# Patient Record
Sex: Male | Born: 1975
Health system: Southern US, Community
[De-identification: ages and names within clinical notes are randomized; demographics above are authoritative.]

## PROBLEM LIST (undated history)

## (undated) DIAGNOSIS — B20 Human immunodeficiency virus [HIV] disease: Secondary | ICD-10-CM

## (undated) DIAGNOSIS — K921 Melena: Secondary | ICD-10-CM

## (undated) DIAGNOSIS — B59 Pneumocystosis: Secondary | ICD-10-CM

## (undated) DIAGNOSIS — R569 Unspecified convulsions: Secondary | ICD-10-CM

## (undated) DIAGNOSIS — J189 Pneumonia, unspecified organism: Secondary | ICD-10-CM

## (undated) DIAGNOSIS — N189 Chronic kidney disease, unspecified: Secondary | ICD-10-CM

## (undated) HISTORY — DX: Human immunodeficiency virus (HIV) disease: B20

## (undated) HISTORY — DX: Unspecified convulsions: R56.9

## (undated) HISTORY — DX: Melena: K92.1

---
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
## 2003-12-27 ENCOUNTER — Emergency Department (HOSPITAL_COMMUNITY): Admission: EM | Admit: 2003-12-27 | Discharge: 2003-12-27 | Payer: Self-pay | Admitting: Emergency Medicine

## 2003-12-28 ENCOUNTER — Emergency Department (HOSPITAL_COMMUNITY): Admission: EM | Admit: 2003-12-28 | Discharge: 2003-12-28 | Payer: Self-pay | Admitting: Emergency Medicine

## 2004-01-04 ENCOUNTER — Emergency Department (HOSPITAL_COMMUNITY): Admission: EM | Admit: 2004-01-04 | Discharge: 2004-01-04 | Payer: Self-pay | Admitting: Emergency Medicine

## 2007-03-16 ENCOUNTER — Emergency Department (HOSPITAL_COMMUNITY): Admission: EM | Admit: 2007-03-16 | Discharge: 2007-03-16 | Payer: Self-pay | Admitting: Emergency Medicine

## 2008-06-15 ENCOUNTER — Emergency Department (HOSPITAL_COMMUNITY): Admission: EM | Admit: 2008-06-15 | Discharge: 2008-06-15 | Payer: Self-pay | Admitting: Emergency Medicine

## 2010-01-09 ENCOUNTER — Emergency Department (HOSPITAL_COMMUNITY)
Admission: EM | Admit: 2010-01-09 | Discharge: 2010-01-09 | Payer: Self-pay | Source: Home / Self Care | Admitting: Emergency Medicine

## 2010-02-02 ENCOUNTER — Inpatient Hospital Stay (HOSPITAL_COMMUNITY)
Admission: EM | Admit: 2010-02-02 | Discharge: 2010-02-05 | Payer: Self-pay | Source: Home / Self Care | Attending: Orthopedic Surgery | Admitting: Orthopedic Surgery

## 2010-02-04 LAB — CBC
HCT: 41.2 % (ref 39.0–52.0)
Hemoglobin: 14 g/dL (ref 13.0–17.0)
MCH: 31.5 pg (ref 26.0–34.0)
MCHC: 34 g/dL (ref 30.0–36.0)
MCV: 92.8 fL (ref 78.0–100.0)
Platelets: 170 10*3/uL (ref 150–400)
RBC: 4.44 MIL/uL (ref 4.22–5.81)
RDW: 12.9 % (ref 11.5–15.5)
WBC: 7 10*3/uL (ref 4.0–10.5)

## 2010-02-04 LAB — GRAM STAIN

## 2010-02-04 LAB — DIFFERENTIAL
Basophils Absolute: 0 10*3/uL (ref 0.0–0.1)
Basophils Relative: 0 % (ref 0–1)
Eosinophils Absolute: 0 10*3/uL (ref 0.0–0.7)
Eosinophils Relative: 0 % (ref 0–5)
Lymphocytes Relative: 28 % (ref 12–46)
Lymphs Abs: 1.9 10*3/uL (ref 0.7–4.0)
Monocytes Absolute: 0.5 10*3/uL (ref 0.1–1.0)
Monocytes Relative: 7 % (ref 3–12)
Neutro Abs: 4.5 10*3/uL (ref 1.7–7.7)
Neutrophils Relative %: 64 % (ref 43–77)

## 2010-02-06 LAB — BASIC METABOLIC PANEL
BUN: 4 mg/dL — ABNORMAL LOW (ref 6–23)
CO2: 28 mEq/L (ref 19–32)
Calcium: 8.4 mg/dL (ref 8.4–10.5)
Chloride: 95 mEq/L — ABNORMAL LOW (ref 96–112)
Creatinine, Ser: 0.85 mg/dL (ref 0.4–1.5)
GFR calc Af Amer: >60 mL/min (ref >60)
GFR calc non Af Amer: >60 mL/min (ref >60)
Glucose, Bld: 101 mg/dL — ABNORMAL HIGH (ref 70–99)
Potassium: 3.8 mEq/L (ref 3.5–5.1)
Sodium: 129 mEq/L — ABNORMAL LOW (ref 135–145)

## 2010-02-06 LAB — WOUND CULTURE

## 2010-02-06 LAB — CULTURE, ROUTINE-ABSCESS

## 2010-02-06 LAB — VANCOMYCIN, TROUGH: Vancomycin Tr: 6.6 ug/mL — ABNORMAL LOW (ref 10.0–20.0)

## 2010-02-08 NOTE — Discharge Summary (Signed)
  NAMELAYTEN, AIKEN NO.:  [id number]  MEDICAL RECORD NO.:  [id number]          PATIENT TYPE:  INP  LOCATION:  5020                         FACILITY:  [facility]  PHYSICIAN:  Madelynn Done, MD  DATE OF BIRTH:  August 21, 1975  DATE OF ADMISSION:  02/02/2010 DATE OF DISCHARGE:  02/05/2010                              DISCHARGE SUMMARY   ADMISSION DIAGNOSIS:  Right forearm abscess.  DISCHARGE DIAGNOSES: 1. Right forearm abscess. 2. Methicillin-resistant staph aureus infection.  PROCEDURES:  Right forearm incision and drainage on February 02, 2010.  DISCHARGE MEDICATIONS: 1. Percocet 5/325 one to two tablets every 4-6 hours as needed for     pain. 2. Doxycycline 100 mg p.o. b.i.d.  REASON FOR ADMISSION:  Mr. Azevedo is a 35 year old right-hand-dominant gentleman, who had worsening right arm pain and swelling.  The patient presented to the emergency department and noted to have worsening abscess of right arm and forearm.  The patient was seen and evaluated in the emergency department and recommended that he undergo the above procedure due to some degree of infection.  HOSPITAL COURSE:  The patient was admitted to the orthopedic floor after he underwent the above procedure.  The patient tolerated this well under general anesthesia.  After the procedure, the patient was began on IV vancomycin for suspected MRSA.  The patient's cultures did grow out MRSA.  The patient's wound was changed on postoperative day #2 and looked much better.  There were no gross purulence.  New dressings applied.  The patient was seen on postoperative day #3 and felt ready to be discharged to home.  On the day of discharge, the patient was afebrile with vital signs stable and normal, tolerating a regular diet, and felt ready to discharge home.  DISPOSITION:  The patient was discharged to home and seen back in the office in approximately 3 days for wound check.  Continue with a long- arm  splint.  No use of the right arm.  Work note was given with restrictions.  He is to continue with the above discharge medications.  Prior to the discharge, the patient's discharge instructions were explained to him, questions were answered, and the patient voiced understanding of the discharge plan.  CONDITION ON DISCHARGE:  Good.     Madelynn Done, MD     FWO/MEDQ  D:  02/05/2010  T:  02/06/2010  Job:  [id number]  Electronically Signed by Bradly Bienenstock IV MD on 02/08/2010 11:56:38 AM

## 2010-02-08 NOTE — Discharge Summary (Signed)
    Electronically Signed by Bradly Bienenstock IV MD on 02/08/2010 11:56:33 AM

## 2010-02-11 LAB — ANAEROBIC CULTURE

## 2010-03-11 ENCOUNTER — Observation Stay (HOSPITAL_COMMUNITY)
Admission: EM | Admit: 2010-03-11 | Discharge: 2010-03-11 | Disposition: A | Discharge status: Home / Self Care | Payer: Self-pay | Attending: Emergency Medicine | Admitting: Emergency Medicine

## 2010-03-11 DIAGNOSIS — L03211 Cellulitis of face: Principal | ICD-10-CM | POA: Insufficient documentation

## 2010-03-11 DIAGNOSIS — L0201 Cutaneous abscess of face: Principal | ICD-10-CM | POA: Insufficient documentation

## 2010-03-13 ENCOUNTER — Emergency Department (HOSPITAL_COMMUNITY)
Admission: EM | Admit: 2010-03-13 | Discharge: 2010-03-13 | Disposition: A | Discharge status: Home / Self Care | Payer: Self-pay | Attending: Emergency Medicine | Admitting: Emergency Medicine

## 2010-03-13 DIAGNOSIS — L03211 Cellulitis of face: Secondary | ICD-10-CM | POA: Insufficient documentation

## 2010-03-13 DIAGNOSIS — L0201 Cutaneous abscess of face: Secondary | ICD-10-CM | POA: Insufficient documentation

## 2010-03-13 DIAGNOSIS — Z09 Encounter for follow-up examination after completed treatment for conditions other than malignant neoplasm: Secondary | ICD-10-CM | POA: Insufficient documentation

## 2010-10-11 LAB — CBC
HCT: 47.3
Hemoglobin: 16.5
MCHC: 34.8
MCV: 97.4
Platelets: 246
RBC: 4.86
RDW: 13.2
WBC: 7.2

## 2010-10-11 LAB — BASIC METABOLIC PANEL
BUN: 13
CO2: 27
Calcium: 8.6
Chloride: 103
Creatinine, Ser: 1.13
GFR calc Af Amer: >60
GFR calc non Af Amer: >60
Glucose, Bld: 95
Potassium: 4.4
Sodium: 134 — ABNORMAL LOW

## 2010-10-11 LAB — DIFFERENTIAL
Basophils Absolute: 0
Basophils Relative: 0
Eosinophils Absolute: 0.1
Eosinophils Relative: 1
Lymphocytes Relative: 32
Lymphs Abs: 2.3
Monocytes Absolute: 0.3
Monocytes Relative: 4
Neutro Abs: 4.5
Neutrophils Relative %: 63

## 2011-02-24 ENCOUNTER — Other Ambulatory Visit: Payer: Self-pay

## 2011-02-24 ENCOUNTER — Emergency Department (HOSPITAL_COMMUNITY)
Admission: EM | Admit: 2011-02-24 | Discharge: 2011-02-24 | Disposition: A | Discharge status: Home / Self Care | Payer: Self-pay | Attending: Emergency Medicine | Admitting: Emergency Medicine

## 2011-02-24 ENCOUNTER — Emergency Department (HOSPITAL_COMMUNITY): Payer: Self-pay

## 2011-02-24 ENCOUNTER — Encounter (HOSPITAL_COMMUNITY): Payer: Self-pay | Admitting: Emergency Medicine

## 2011-02-24 DIAGNOSIS — F17200 Nicotine dependence, unspecified, uncomplicated: Secondary | ICD-10-CM | POA: Insufficient documentation

## 2011-02-24 DIAGNOSIS — R079 Chest pain, unspecified: Secondary | ICD-10-CM | POA: Insufficient documentation

## 2011-02-24 DIAGNOSIS — R05 Cough: Secondary | ICD-10-CM | POA: Insufficient documentation

## 2011-02-24 DIAGNOSIS — K029 Dental caries, unspecified: Secondary | ICD-10-CM | POA: Insufficient documentation

## 2011-02-24 DIAGNOSIS — R059 Cough, unspecified: Secondary | ICD-10-CM | POA: Insufficient documentation

## 2011-02-24 DIAGNOSIS — K089 Disorder of teeth and supporting structures, unspecified: Secondary | ICD-10-CM | POA: Insufficient documentation

## 2011-02-24 DIAGNOSIS — R0602 Shortness of breath: Secondary | ICD-10-CM | POA: Insufficient documentation

## 2011-02-24 DIAGNOSIS — K0889 Other specified disorders of teeth and supporting structures: Secondary | ICD-10-CM

## 2011-02-24 MED ORDER — HYDROCODONE-ACETAMINOPHEN 5-325 MG PO TABS: 1.0000 | ORAL_TABLET | ORAL | Status: AC | PRN

## 2011-02-24 MED ORDER — ALBUTEROL SULFATE HFA 108 (90 BASE) MCG/ACT IN AERS
2.0000 | INHALATION_SPRAY | Freq: Four times a day (QID) | RESPIRATORY_TRACT | Status: DC
  Administered 2011-02-24: 2 via RESPIRATORY_TRACT
  Filled 2011-02-24: qty 6.7

## 2011-02-24 MED ORDER — PENICILLIN V POTASSIUM 500 MG PO TABS: 500.0000 mg | ORAL_TABLET | Freq: Three times a day (TID) | ORAL | Status: DC

## 2011-02-24 MED ORDER — PENICILLIN V POTASSIUM 500 MG PO TABS: 500.0000 mg | ORAL_TABLET | Freq: Three times a day (TID) | ORAL | Status: AC

## 2011-02-24 NOTE — ED Provider Notes (Signed)
History     CSN: [id number]  Arrival date & time 02/24/11  4098   First MD Initiated Contact with Patient 02/24/11 2032      Chief Complaint  Patient presents with  . Cough    (Consider location/radiation/quality/duration/timing/severity/associated sxs/prior treatment) HPI History provided by pt.   Pt has had a cough productive of white phlegm for the past month.  Denies hemoptysis.  Last night he developed SOB while he was lying in bed but it improved when he sat up.  Has mild pain in his chest when he coughs.  Has had subjective fever as well.  Pt smokes cigarettes but this cough is worse than his baseline.  Known sick contacts.  No h/o cardiopulmonary disease.  No RF for PE.  Also c/o right lower toothache; onset around same time as cough.  Has taken aspirin and ibuprofen w/ some relief.   History reviewed. No pertinent past medical history.  Past Surgical History  Procedure Date  . Appendectomy     No family history on file.  History  Substance Use Topics  . Smoking status: Current Everyday Smoker  . Smokeless tobacco: Not on file  . Alcohol Use: Yes      Review of Systems  All other systems reviewed and are negative.    Allergies  Review of patient's allergies indicates no known allergies.  Home Medications  No current outpatient prescriptions on file.  BP 111/78  Pulse 96  Temp(Src) 97.8 F (36.6 C) (Oral)  Resp 15  SpO2 98%  Physical Exam  Nursing note and vitals reviewed. Constitutional: He is oriented to person, place, and time. He appears well-developed and well-nourished. No distress.       Pt is wearing multiple layers including a large winter jacket.   HENT:  Head: Normocephalic and atraumatic.       Right third lower molar w/ decay and mild ttp.  Adjacent gingiva appears normal.   Eyes:       Normal appearance  Neck: Normal range of motion.       Submandibular lymphadenopathy  Cardiovascular: Normal rate.        Elevated HR at approx  96bpm  Pulmonary/Chest: Effort normal and breath sounds normal.  Musculoskeletal:       No peripheral edema or calf tenderness  Neurological: He is alert and oriented to person, place, and time.  Skin: Skin is warm. No rash noted. He is diaphoretic.  Psychiatric: He has a normal mood and affect. His behavior is normal.    ED Course  Procedures (including critical care time)   Date: 02/24/2011  Rate: 88  Rhythm: normal sinus rhythm  QRS Axis: normal  Intervals: normal  ST/T Wave abnormalities: normal  Conduction Disutrbances:none  Narrative Interpretation:   Old EKG Reviewed: none available   Labs Reviewed - No data to display Dg Chest 2 View  02/24/2011  *RADIOLOGY REPORT*  Clinical Data: Cough, shortness of breath  CHEST - 2 VIEW  Comparison: None.  Findings: Lungs clear.  Heart size and pulmonary vascularity normal.  No effusion.  Visualized bones unremarkable.  IMPRESSION: No acute disease  Original Report Authenticated By: Thora Lance III, M.D.     1. Cough   2. Toothache       MDM  36yo smoker, otherwise healthy, presents w/ 1 month productive cough + new onset SOB while laying in bed last night.  Also c/o toothache.  On exam, afebrile, mildly diaphoretic (wearing multiple layers), no respiratory distress,  lungs CTA, occasional coughing, Right 3rd lower molar decayed and ttp.  CXR neg and screening EKG unremarkable.  Pt received an albuterol inhaler for cough and d/c'd home w/ abx (possible periapical abscess) and vicodin as well as referrals to [facility] and several low cost dental clinics.  Return precautions discussed.         Otilio Miu, [place] 02/24/11 2231

## 2011-02-24 NOTE — ED Notes (Signed)
PT. REPORTS PRODUCTIVE COUGH FOR 1 MONTH AND RIGHT LOWER MOLAR PAIN UNRELIEVED BY OTC PAIN MEDICATIONS .

## 2011-02-25 NOTE — ED Provider Notes (Signed)
Medical screening examination/treatment/procedure(s) were conducted as a shared visit with non-physician practitioner(s) and myself.  I personally evaluated the patient during the encounter    Jailee Jaquez R Gregorey Nabor, MD 02/25/11 1059 

## 2011-04-21 DIAGNOSIS — Z21 Asymptomatic human immunodeficiency virus [HIV] infection status: Secondary | ICD-10-CM

## 2011-04-21 DIAGNOSIS — J189 Pneumonia, unspecified organism: Secondary | ICD-10-CM

## 2011-04-21 DIAGNOSIS — B20 Human immunodeficiency virus [HIV] disease: Secondary | ICD-10-CM

## 2011-04-21 HISTORY — DX: Human immunodeficiency virus (HIV) disease: B20

## 2011-04-21 HISTORY — DX: Pneumonia, unspecified organism: J18.9

## 2011-04-21 HISTORY — DX: Asymptomatic human immunodeficiency virus (hiv) infection status: Z21

## 2011-04-22 ENCOUNTER — Encounter (HOSPITAL_COMMUNITY): Payer: Self-pay

## 2011-04-22 ENCOUNTER — Emergency Department (HOSPITAL_COMMUNITY): Payer: Medicaid Other

## 2011-04-22 ENCOUNTER — Emergency Department (HOSPITAL_COMMUNITY)
Admission: EM | Admit: 2011-04-22 | Discharge: 2011-04-22 | Disposition: A | Discharge status: Home / Self Care | Payer: Medicaid Other | Attending: Emergency Medicine | Admitting: Emergency Medicine

## 2011-04-22 DIAGNOSIS — R0602 Shortness of breath: Secondary | ICD-10-CM | POA: Insufficient documentation

## 2011-04-22 DIAGNOSIS — F17200 Nicotine dependence, unspecified, uncomplicated: Secondary | ICD-10-CM | POA: Insufficient documentation

## 2011-04-22 MED ORDER — ALBUTEROL SULFATE (5 MG/ML) 0.5% IN NEBU
INHALATION_SOLUTION | RESPIRATORY_TRACT | Status: AC
  Administered 2011-04-22: 2.5 mg
  Filled 2011-04-22: qty 0.5

## 2011-04-22 MED ORDER — IPRATROPIUM BROMIDE 0.02 % IN SOLN
RESPIRATORY_TRACT | Status: AC
  Administered 2011-04-22: 0.5 mg
  Filled 2011-04-22: qty 2.5

## 2011-04-22 MED ORDER — ALBUTEROL SULFATE HFA 108 (90 BASE) MCG/ACT IN AERS
2.0000 | INHALATION_SPRAY | RESPIRATORY_TRACT | Status: DC | PRN
  Administered 2011-04-22: 2 via RESPIRATORY_TRACT
  Filled 2011-04-22: qty 6.7

## 2011-04-22 NOTE — ED Notes (Signed)
Patient transported to X-ray 

## 2011-04-22 NOTE — Discharge Instructions (Signed)
Bronchitis Bronchitis is the body's way of reacting to injury and/or infection (inflammation) of the bronchi. Bronchi are the air tubes that extend from the windpipe into the lungs. If the inflammation becomes severe, it may cause shortness of breath. CAUSES  Inflammation may be caused by:  A virus.   Germs (bacteria).   Dust.   Allergens.   Pollutants and many other irritants.  The cells lining the bronchial tree are covered with tiny hairs (cilia). These constantly beat upward, away from the lungs, toward the mouth. This keeps the lungs free of pollutants. When these cells become too irritated and are unable to do their job, mucus begins to develop. This causes the characteristic cough of bronchitis. The cough clears the lungs when the cilia are unable to do their job. Without either of these protective mechanisms, the mucus would settle in the lungs. Then you would develop pneumonia. Smoking is a common cause of bronchitis and can contribute to pneumonia. Stopping this habit is the single most important thing you can do to help yourself. TREATMENT  Removing whatever causes the problem (smoking, for example) is critical to preventing the problem from getting worse.   Inhaled medicines may be prescribed to help with symptoms now and to help prevent problems from returning.   For those with recurrent (chronic) bronchitis, there may be a need for steroid medicines.  SEEK IMMEDIATE MEDICAL CARE IF:    You have a fever.   You become progressively more ill.   You have increased difficulty breathing, wheezing, or shortness of breath.   You develop pain in your chest.  MAKE SURE YOU:   Understand these instructions.   Will watch your condition.   Will get help right away if you are not doing well or get worse.  Document Released: 01/06/2005 Document Revised: 12/26/2010 Document Reviewed: 11/16/2007 [facility] Patient Information 2012 [place], [place].Bronchitis Bronchitis is the  body's way of reacting to injury and/or infection (inflammation) of the bronchi. Bronchi are the air tubes that extend from the windpipe into the lungs. If the inflammation becomes severe, it may cause shortness of breath. CAUSES  Inflammation may be caused by:  A virus.   Germs (bacteria).   Dust.   Allergens.   Pollutants and many other irritants.  The cells lining the bronchial tree are covered with tiny hairs (cilia). These constantly beat upward, away from the lungs, toward the mouth. This keeps the lungs free of pollutants. When these cells become too irritated and are unable to do their job, mucus begins to develop. This causes the characteristic cough of bronchitis. The cough clears the lungs when the cilia are unable to do their job. Without either of these protective mechanisms, the mucus would settle in the lungs. Then you would develop pneumonia. Smoking is a common cause of bronchitis and can contribute to pneumonia. Stopping this habit is the single most important thing you can do to help yourself. TREATMENT   Your caregiver may prescribe an antibiotic if the cough is caused by bacteria. Also, medicines that open up your airways make it easier to breathe. Your caregiver may also recommend or prescribe an expectorant. It will loosen the mucus to be coughed up. Only take over-the-counter or prescription medicines for pain, discomfort, or fever as directed by your caregiver.   Removing whatever causes the problem (smoking, for example) is critical to preventing the problem from getting worse.   Cough suppressants may be prescribed for relief of cough symptoms.   Inhaled medicines  may be prescribed to help with symptoms now and to help prevent problems from returning.   For those with recurrent (chronic) bronchitis, there may be a need for steroid medicines.  SEEK IMMEDIATE MEDICAL CARE IF:   During treatment, you develop more pus-like mucus (purulent sputum).   You have a  fever.   Your baby is older than 3 months with a rectal temperature of 102 F (38.9 C) or higher.   Your baby is 81 months old or younger with a rectal temperature of 100.4 F (38 C) or higher.   You become progressively more ill.   You have increased difficulty breathing, wheezing, or shortness of breath.  It is necessary to seek immediate medical care if you are elderly or sick from any other disease. MAKE SURE YOU:   Understand these instructions.   Will watch your condition.   Will get help right away if you are not doing well or get worse.  Document Released: 01/06/2005 Document Revised: 12/26/2010 Document Reviewed: 11/16/2007 [facility] Patient Information 2012 [place], [place].

## 2011-04-22 NOTE — ED Notes (Signed)
Pt complains of being short of breath and a cough for three days

## 2011-04-22 NOTE — ED Provider Notes (Signed)
History     CSN: [id number]  Arrival date & time 04/22/11  2121   First MD Initiated Contact with Patient 04/22/11 2218      Chief Complaint  Patient presents with  . Shortness of Breath    (Consider location/radiation/quality/duration/timing/severity/associated sxs/prior treatment) HPI Comments: Patient reports that he had had a productive cough for the past month and has been feeling short of breath for the past few days.  He had smoked 1ppd for 20 years, but quit smoking 5 days ago.  He was seen in the ED on 02/24/11 for the same complaint.  At that time his work up was negative.  No history of cardiopulmonary disease.  No lower extremity edema, prolonged travel in the past 4 weeks, or surgery in the past 4 weeks.  The history is provided by the patient.    History reviewed. No pertinent past medical history.  Past Surgical History  Procedure Date  . Appendectomy     History reviewed. No pertinent family history.  History  Substance Use Topics  . Smoking status: Current Everyday Smoker  . Smokeless tobacco: Not on file  . Alcohol Use: Yes      Review of Systems  Constitutional: Positive for fever. Negative for chills.  Respiratory: Positive for shortness of breath. Negative for chest tightness and wheezing.   Cardiovascular: Negative for chest pain.  Neurological: Negative for dizziness and light-headedness.    Allergies  Review of patient's allergies indicates no known allergies.  Home Medications  No current outpatient prescriptions on file.  BP 122/82  Pulse 97  Temp(Src) 98.3 F (36.8 C) (Oral)  Resp 18  SpO2 97%  Physical Exam  Nursing note and vitals reviewed. Constitutional: He appears well-developed and well-nourished. No distress.  HENT:  Head: Normocephalic and atraumatic.  Right Ear: Tympanic membrane normal.  Left Ear: Tympanic membrane normal.  Nose: Nose normal. Right sinus exhibits no maxillary sinus tenderness and no frontal sinus  tenderness. Left sinus exhibits no maxillary sinus tenderness and no frontal sinus tenderness.  Mouth/Throat: Uvula is midline, oropharynx is clear and moist and mucous membranes are normal.  Cardiovascular: Normal rate, regular rhythm and normal heart sounds.   Pulmonary/Chest: Effort normal and breath sounds normal. No accessory muscle usage. Not tachypneic. No respiratory distress. He has no decreased breath sounds. He has no wheezes. He has no rhonchi. He has no rales. He exhibits no tenderness.  Neurological: He is alert.  Skin: Skin is warm. He is not diaphoretic.  Psychiatric: He has a normal mood and affect.    ED Course  Procedures (including critical care time)  Labs Reviewed - No data to display Dg Chest 2 View  04/22/2011  *RADIOLOGY REPORT*  Clinical Data: Cough and shortness of breath.  Smoker.  CHEST - 2 VIEW  Comparison: 02/24/2011  Findings: Mild hyperinflation.  Normal heart size and pulmonary vascularity.  Central peribronchial thickening suggesting bronchitis.  No focal airspace consolidation.  No blunting of costophrenic angles.  No pneumothorax.  IMPRESSION: Mild hyperinflation and chronic bronchitic changes.  No evidence of active consolidation.  Original Report Authenticated By: Marlon Pel, M.D.     No diagnosis found.    MDM  CXR negative for any acute abnormalities.  CXR showing chronic bronchitic changes.  Patient not in any respiratory distress.  Pulse ox 97 on RA.  Patient given Albuterol inhaler while in the ED.  Return precautions discussed with patient.        Kiesha Ensey Anne Shutter, PA-C  04/23/11 0148 

## 2011-04-22 NOTE — ED Notes (Signed)
Patient transported to X-ray by foot.

## 2011-04-23 NOTE — ED Provider Notes (Signed)
Medical screening examination/treatment/procedure(s) were performed by non-physician practitioner and as supervising physician I was immediately available for consultation/collaboration.   Clark Cuff, MD 04/23/11 1317 

## 2011-04-27 ENCOUNTER — Emergency Department (HOSPITAL_COMMUNITY): Payer: Medicaid Other

## 2011-04-27 ENCOUNTER — Inpatient Hospital Stay (HOSPITAL_COMMUNITY)
Admission: EM | Admit: 2011-04-27 | Discharge: 2011-05-15 | DRG: 974 | Disposition: A | Discharge status: Home Health | Payer: Medicaid Other | Attending: Family Medicine | Admitting: Family Medicine

## 2011-04-27 ENCOUNTER — Encounter (HOSPITAL_COMMUNITY): Payer: Self-pay | Admitting: Emergency Medicine

## 2011-04-27 DIAGNOSIS — Z79899 Other long term (current) drug therapy: Secondary | ICD-10-CM

## 2011-04-27 DIAGNOSIS — S0230XA Fracture of orbital floor, unspecified side, initial encounter for closed fracture: Secondary | ICD-10-CM | POA: Diagnosis present

## 2011-04-27 DIAGNOSIS — J9601 Acute respiratory failure with hypoxia: Secondary | ICD-10-CM | POA: Diagnosis present

## 2011-04-27 DIAGNOSIS — R5381 Other malaise: Secondary | ICD-10-CM | POA: Diagnosis present

## 2011-04-27 DIAGNOSIS — IMO0002 Reserved for concepts with insufficient information to code with codable children: Secondary | ICD-10-CM

## 2011-04-27 DIAGNOSIS — N289 Disorder of kidney and ureter, unspecified: Secondary | ICD-10-CM

## 2011-04-27 DIAGNOSIS — N179 Acute kidney failure, unspecified: Secondary | ICD-10-CM | POA: Diagnosis present

## 2011-04-27 DIAGNOSIS — R918 Other nonspecific abnormal finding of lung field: Secondary | ICD-10-CM | POA: Diagnosis present

## 2011-04-27 DIAGNOSIS — Z23 Encounter for immunization: Secondary | ICD-10-CM

## 2011-04-27 DIAGNOSIS — R0902 Hypoxemia: Secondary | ICD-10-CM | POA: Diagnosis present

## 2011-04-27 DIAGNOSIS — B20 Human immunodeficiency virus [HIV] disease: Principal | ICD-10-CM | POA: Diagnosis present

## 2011-04-27 DIAGNOSIS — B1920 Unspecified viral hepatitis C without hepatic coma: Secondary | ICD-10-CM | POA: Diagnosis present

## 2011-04-27 DIAGNOSIS — B370 Candidal stomatitis: Secondary | ICD-10-CM | POA: Diagnosis present

## 2011-04-27 DIAGNOSIS — B3781 Candidal esophagitis: Secondary | ICD-10-CM | POA: Diagnosis present

## 2011-04-27 DIAGNOSIS — R64 Cachexia: Secondary | ICD-10-CM | POA: Diagnosis present

## 2011-04-27 DIAGNOSIS — A539 Syphilis, unspecified: Secondary | ICD-10-CM | POA: Diagnosis present

## 2011-04-27 DIAGNOSIS — Z681 Body mass index (BMI) 19 or less, adult: Secondary | ICD-10-CM

## 2011-04-27 DIAGNOSIS — J9600 Acute respiratory failure, unspecified whether with hypoxia or hypercapnia: Secondary | ICD-10-CM | POA: Diagnosis present

## 2011-04-27 DIAGNOSIS — D649 Anemia, unspecified: Secondary | ICD-10-CM | POA: Diagnosis present

## 2011-04-27 DIAGNOSIS — R1319 Other dysphagia: Secondary | ICD-10-CM | POA: Diagnosis present

## 2011-04-27 DIAGNOSIS — Z87891 Personal history of nicotine dependence: Secondary | ICD-10-CM

## 2011-04-27 DIAGNOSIS — B59 Pneumocystosis: Secondary | ICD-10-CM | POA: Diagnosis present

## 2011-04-27 DIAGNOSIS — E46 Unspecified protein-calorie malnutrition: Secondary | ICD-10-CM | POA: Diagnosis present

## 2011-04-27 LAB — BASIC METABOLIC PANEL
Calcium: 9.2 mg/dL (ref 8.4–10.5)
GFR calc non Af Amer: 43 mL/min — ABNORMAL LOW (ref >90)
Glucose, Bld: 99 mg/dL (ref 70–99)
Sodium: 135 mEq/L (ref 135–145)

## 2011-04-27 LAB — CBC
HCT: 32 % — ABNORMAL LOW (ref 39.0–52.0)
Hemoglobin: 10.8 g/dL — ABNORMAL LOW (ref 13.0–17.0)
MCH: 30.2 pg (ref 26.0–34.0)
MCHC: 33.8 g/dL (ref 30.0–36.0)
MCV: 89.4 fL (ref 78.0–100.0)
Platelets: 412 10*3/uL — ABNORMAL HIGH (ref 150–400)
RBC: 3.58 MIL/uL — ABNORMAL LOW (ref 4.22–5.81)
RDW: 14.3 % (ref 11.5–15.5)
WBC: 11.5 10*3/uL — ABNORMAL HIGH (ref 4.0–10.5)

## 2011-04-27 LAB — POCT I-STAT 3, ART BLOOD GAS (G3+)
Patient temperature: 98.6
TCO2: 23 mmol/L (ref 0–100)
pH, Arterial: 7.428 (ref 7.350–7.450)

## 2011-04-27 LAB — POCT I-STAT 3, VENOUS BLOOD GAS (G3P V)
O2 Saturation: 52 %
TCO2: 25 mmol/L (ref 0–100)
pH, Ven: 7.36 — ABNORMAL HIGH (ref 7.250–7.300)

## 2011-04-27 MED ORDER — IOHEXOL 350 MG/ML SOLN
50.0000 mL | Freq: Once | INTRAVENOUS | Status: AC | PRN
  Administered 2011-04-27: 50 mL via INTRAVENOUS

## 2011-04-27 MED ORDER — SODIUM CHLORIDE 0.9 % IV BOLUS (SEPSIS)
1000.0000 mL | Freq: Once | INTRAVENOUS | Status: AC
  Administered 2011-04-28: 1000 mL via INTRAVENOUS

## 2011-04-27 MED ORDER — DEXTROSE 5 % IV SOLN
1.0000 g | Freq: Once | INTRAVENOUS | Status: AC
  Administered 2011-04-27: 1 g via INTRAVENOUS
  Filled 2011-04-27: qty 10

## 2011-04-27 MED ORDER — SODIUM CHLORIDE 0.9 % IV BOLUS (SEPSIS)
1000.0000 mL | Freq: Once | INTRAVENOUS | Status: AC
  Administered 2011-04-27: 1000 mL via INTRAVENOUS

## 2011-04-27 MED ORDER — DEXTROSE 5 % IV SOLN
500.0000 mg | Freq: Once | INTRAVENOUS | Status: AC
  Administered 2011-04-28: 500 mg via INTRAVENOUS
  Filled 2011-04-27: qty 500

## 2011-04-27 MED ORDER — IOHEXOL 300 MG/ML  SOLN: 100.0000 mL | Freq: Once | INTRAMUSCULAR | Status: DC | PRN

## 2011-04-27 NOTE — ED Notes (Addendum)
C/o generalized weakness, body aches, difficulty breathing, and productive cough with white sputum x 1 week.  Pt states he was seen in ED a few days ago and diagnosed with bronchitis.  States he only received an inhaler and is using it without any improvements.  O2 sats down to 85% while talking (low 90s without talking).

## 2011-04-27 NOTE — ED Notes (Signed)
Abnormal i-STAT G3+ was shown to PA Elmore.

## 2011-04-27 NOTE — ED Provider Notes (Signed)
History     CSN: [id number]  Arrival date & time 04/27/11  1759   First MD Initiated Contact with Patient 04/27/11 1911      Chief Complaint  Patient presents with  . Shortness of Breath    (Consider location/radiation/quality/duration/timing/severity/associated sxs/prior treatment) The history is provided by the patient.  36 y/o otherwise healthy male presents to ED with c/c of shortness of breath with a cough productive of white sputum for the last 4 or 5 days. He has had associated subjective fever, ear congestion, nausea, myalgias, malaise, and a decreased appetite. He feels he has lost some weight in the last few days. He was seen in the emergency department when the symptoms began 5 days ago, but they were not severe at that time and he was diagnosed with bronchitis and discharged home with an albuterol inhaler, which he reports as not helping his symptoms. Although he is a former smoker, reports quitting 3 weeks ago and smoked approx 1 pack every 3-4 days prior to that. Only recent travel was a trip to [facility] 1-2 months ago; he got out of the car frequently throughout the drive. No personal or family hx of DVT or PE. Exertion and talking make his symptoms worse, nothing makes them better.   History reviewed. No pertinent past medical history.  Past Surgical History  Procedure Date  . Appendectomy     No family history on file.  History  Substance Use Topics  . Smoking status: Former smoker, quit 3 weeks ago  . Smokeless tobacco: Not on file  . Alcohol Use: Yes      Review of Systems 10 systems reviewed and are negative for acute change except as noted in the HPI.  Allergies  Review of patient's allergies indicates no known allergies.  Home Medications   Current Outpatient Rx  Name Route Sig Dispense Refill  . ALBUTEROL SULFATE HFA 108 (90 BASE) MCG/ACT IN AERS Inhalation Inhale 2 puffs into the lungs every 4 (four) hours as needed. For shortness of breath      . NAPROXEN SODIUM 220 MG PO TABS Oral Take 220 mg by mouth daily as needed. For pain      BP 105/76  Pulse 126  Temp(Src) 97.8 F (36.6 C) (Oral)  Resp 38  SpO2 86%  Physical Exam  Constitutional: He is oriented to person, place, and time. He appears well-developed.       Vital signs are reviewed and are significant for tachycardia, tachypnea, hypoxia on room air. Patient is cachectic, anxious appearing.  HENT:  Head: Normocephalic and atraumatic.       Dry skin to bilateral external ears with a 2 cm fissure to the skin of the left external ear with no bleeding or other drainage, mildly tender to palpation. Bilateral TM normal. Several white patches to right posterior oral cavity, easily scraped away with tongue blade, no underlying erythema and no tenderness to the areas. Oral mucosa slightly dry.  Eyes: EOM are normal. Pupils are equal, round, and reactive to light. Right eye exhibits no discharge. Left eye exhibits no discharge.  Neck: Normal range of motion. Neck supple.  Cardiovascular: Regular rhythm, normal heart sounds and intact distal pulses.   No murmur heard.      Tachycardia  Pulmonary/Chest: No accessory muscle usage or stridor. Tachypnea noted. He has decreased breath sounds. He has no wheezes. He has no rhonchi. He has no rales.  Abdominal:       Abd flat, soft,  NT/ND. Nml BS.  Musculoskeletal: He exhibits no edema and no tenderness.  Lymphadenopathy:    He has no cervical adenopathy.  Neurological: He is alert and oriented to person, place, and time. No cranial nerve deficit.  Skin: Skin is warm and dry. No rash noted.    ED Course  Procedures (including critical care time)  Labs Reviewed  CBC - Abnormal; Notable for the following:    WBC 11.5 (*)    RBC 3.58 (*)    Hemoglobin 10.8 (*)    HCT 32.0 (*)    Platelets 412 (*)    All other components within normal limits  BASIC METABOLIC PANEL - Abnormal; Notable for the following:    BUN 64 (*)     Creatinine, Ser 1.93 (*)    GFR calc non Af Amer 43 (*)    GFR calc Af Amer 50 (*)    All other components within normal limits  D-DIMER, QUANTITATIVE - Abnormal; Notable for the following:    D-Dimer, Quant 2.93 (*)    All other components within normal limits  POCT I-STAT 3, BLOOD GAS (G3+) - Abnormal; Notable for the following:    pCO2 arterial 33.3 (*)    All other components within normal limits  POCT I-STAT 3, BLOOD GAS (G3P V) - Abnormal; Notable for the following:    pH, Ven 7.360 (*)    pCO2, Ven 41.2 (*)    pO2, Ven 29.0 (*)    All other components within normal limits   Dg Chest 2 View  04/27/2011  *RADIOLOGY REPORT*  Clinical Data: Shortness of breath.  Hypoxia.  Tachycardia.  CHEST - 2 VIEW  Comparison: 04/22/2011 and 02/24/2011.  No earlier exams.  Findings: Diffuse increased interstitial markings may represent interstitial infiltrate and / or pulmonary edema.  No pneumothorax.  Heart size within normal limits.  Prominent central pulmonary vasculature.  Bones appears slightly sclerotic.  IMPRESSION: Diffuse increased interstitial markings may represent interstitial infiltrate and / or pulmonary edema.  Original Report Authenticated By: Fuller Canada, M.D.    Date: 04/28/2011  Rate: 119  Rhythm: sinus tachycardia  QRS Axis: normal  Intervals: normal  ST/T Wave abnormalities: normal  Conduction Disutrbances:none  Narrative Interpretation:   Old EKG Reviewed: none available    1. Hypoxia   2. Anemia   3. Renal insufficiency       MDM  Second ED visit for cough, SOB. Symtpoms worsening. VS concerning. Review of labs shows new renal insufficiency, hypoxemia only on VBG (ABG in acceptable range), new anemia, slight leukocytosis. I reviewed the chest x-ray images, findings likely interstitial infiltrate rather than pulmonary edema given pt presentation and VS. CT angio chest ordered as pt has clinical signs of possible PE and elevated D-Dimer, results reviewed, diffuse  airspace disease without determined etiology. Differential dx includes atypical infection, HIV, cancer of unknown location. Pt is to be admitted to the hospital under teaching service, I have spoken with Dr Lula Olszewski who will see in ED.        Shaaron Adler, PA-C 04/28/11 0037  Shaaron Adler, PA-C 04/28/11 0040

## 2011-04-27 NOTE — ED Notes (Signed)
DUE TO PT O2 STATS PT PLACED ON 2LPM O2 BY N/C O2 STATS CAME UP TO 92%

## 2011-04-28 ENCOUNTER — Encounter (HOSPITAL_COMMUNITY): Payer: Self-pay | Admitting: *Deleted

## 2011-04-28 DIAGNOSIS — R0902 Hypoxemia: Secondary | ICD-10-CM | POA: Diagnosis present

## 2011-04-28 LAB — COMPREHENSIVE METABOLIC PANEL
ALT: 7 U/L (ref 0–53)
AST: 48 U/L — ABNORMAL HIGH (ref 0–37)
Calcium: 8.5 mg/dL (ref 8.4–10.5)
Creatinine, Ser: 1.62 mg/dL — ABNORMAL HIGH (ref 0.50–1.35)
GFR calc Af Amer: 62 mL/min — ABNORMAL LOW (ref >90)
GFR calc non Af Amer: 53 mL/min — ABNORMAL LOW (ref >90)
Glucose, Bld: 119 mg/dL — ABNORMAL HIGH (ref 70–99)
Sodium: 133 mEq/L — ABNORMAL LOW (ref 135–145)
Total Protein: 7.3 g/dL (ref 6.0–8.3)

## 2011-04-28 LAB — URINE MICROSCOPIC-ADD ON

## 2011-04-28 LAB — URINALYSIS, ROUTINE W REFLEX MICROSCOPIC
Bilirubin Urine: NEGATIVE
Ketones, ur: NEGATIVE mg/dL
Nitrite: NEGATIVE
Urobilinogen, UA: 1 mg/dL (ref 0.0–1.0)

## 2011-04-28 LAB — CBC
MCH: 29.6 pg (ref 26.0–34.0)
MCHC: 32.4 g/dL (ref 30.0–36.0)
MCV: 91.3 fL (ref 78.0–100.0)
Platelets: 395 10*3/uL (ref 150–400)

## 2011-04-28 LAB — ANGIOTENSIN CONVERTING ENZYME: Angiotensin-Converting Enzyme: 38 U/L (ref 8–52)

## 2011-04-28 LAB — HIV ANTIBODY (ROUTINE TESTING W REFLEX): HIV: REACTIVE — AB

## 2011-04-28 MED ORDER — ACETAMINOPHEN 650 MG RE SUPP
650.0000 mg | Freq: Four times a day (QID) | RECTAL | Status: DC | PRN
  Filled 2011-04-28: qty 1

## 2011-04-28 MED ORDER — ONDANSETRON HCL 4 MG/2ML IJ SOLN
4.0000 mg | Freq: Four times a day (QID) | INTRAMUSCULAR | Status: DC | PRN
  Administered 2011-05-05: 4 mg via INTRAVENOUS
  Filled 2011-04-28: qty 2

## 2011-04-28 MED ORDER — PNEUMOCOCCAL VAC POLYVALENT 25 MCG/0.5ML IJ INJ
0.5000 mL | INJECTION | INTRAMUSCULAR | Status: AC
  Administered 2011-04-29: 0.5 mL via INTRAMUSCULAR
  Filled 2011-04-28: qty 0.5

## 2011-04-28 MED ORDER — SODIUM CHLORIDE 0.9 % IV SOLN
INTRAVENOUS | Status: DC
  Administered 2011-04-28 – 2011-04-29 (×5): via INTRAVENOUS
  Administered 2011-04-30: 1000 mL via INTRAVENOUS
  Administered 2011-04-30 – 2011-05-10 (×9): via INTRAVENOUS
  Administered 2011-05-11: 1000 mL via INTRAVENOUS

## 2011-04-28 MED ORDER — ZOLPIDEM TARTRATE 5 MG PO TABS
5.0000 mg | ORAL_TABLET | Freq: Every evening | ORAL | Status: DC | PRN
  Administered 2011-05-04 – 2011-05-05 (×2): 5 mg via ORAL
  Filled 2011-04-28 (×2): qty 1

## 2011-04-28 MED ORDER — SULFAMETHOXAZOLE-TRIMETHOPRIM 400-80 MG PO TABS
2.5000 | ORAL_TABLET | Freq: Four times a day (QID) | ORAL | Status: DC
  Filled 2011-04-28 (×4): qty 3

## 2011-04-28 MED ORDER — DOCUSATE SODIUM 100 MG PO CAPS
100.0000 mg | ORAL_CAPSULE | Freq: Two times a day (BID) | ORAL | Status: DC | PRN
  Filled 2011-04-28: qty 1

## 2011-04-28 MED ORDER — SULFAMETHOXAZOLE-TMP DS 800-160 MG PO TABS
1.0000 | ORAL_TABLET | Freq: Every day | ORAL | Status: DC
  Administered 2011-04-28: 1 via ORAL
  Filled 2011-04-28: qty 1

## 2011-04-28 MED ORDER — ENSURE COMPLETE PO LIQD
237.0000 mL | Freq: Two times a day (BID) | ORAL | Status: DC
  Administered 2011-04-28 – 2011-04-30 (×3): 237 mL via ORAL

## 2011-04-28 MED ORDER — ALBUTEROL SULFATE (5 MG/ML) 0.5% IN NEBU
2.5000 mg | INHALATION_SOLUTION | RESPIRATORY_TRACT | Status: DC | PRN
  Administered 2011-04-28 – 2011-05-11 (×10): 2.5 mg via RESPIRATORY_TRACT
  Filled 2011-04-28 (×11): qty 0.5

## 2011-04-28 MED ORDER — SODIUM CHLORIDE 0.9 % IJ SOLN
3.0000 mL | Freq: Two times a day (BID) | INTRAMUSCULAR | Status: DC
  Administered 2011-05-01 – 2011-05-15 (×15): 3 mL via INTRAVENOUS

## 2011-04-28 MED ORDER — SODIUM CHLORIDE 0.9 % IV BOLUS (SEPSIS)
1000.0000 mL | Freq: Once | INTRAVENOUS | Status: AC
  Administered 2011-04-28: 1000 mL via INTRAVENOUS

## 2011-04-28 MED ORDER — SULFAMETHOXAZOLE-TMP DS 800-160 MG PO TABS
2.0000 | ORAL_TABLET | Freq: Three times a day (TID) | ORAL | Status: DC
  Administered 2011-04-28 – 2011-04-29 (×3): 2 via ORAL
  Filled 2011-04-28 (×5): qty 2

## 2011-04-28 MED ORDER — ONDANSETRON HCL 4 MG PO TABS: 4.0000 mg | ORAL_TABLET | Freq: Four times a day (QID) | ORAL | Status: DC | PRN

## 2011-04-28 MED ORDER — HEPARIN SODIUM (PORCINE) 5000 UNIT/ML IJ SOLN
5000.0000 [IU] | Freq: Three times a day (TID) | INTRAMUSCULAR | Status: DC
  Administered 2011-04-28 – 2011-05-13 (×46): 5000 [IU] via SUBCUTANEOUS
  Filled 2011-04-28 (×51): qty 1

## 2011-04-28 MED ORDER — ALUM & MAG HYDROXIDE-SIMETH 200-200-20 MG/5ML PO SUSP
30.0000 mL | Freq: Four times a day (QID) | ORAL | Status: DC | PRN
  Filled 2011-04-28 (×2): qty 30

## 2011-04-28 MED ORDER — OXYCODONE HCL 5 MG PO TABS
5.0000 mg | ORAL_TABLET | Freq: Four times a day (QID) | ORAL | Status: DC | PRN
  Administered 2011-04-28 – 2011-05-15 (×10): 5 mg via ORAL
  Filled 2011-04-28 (×11): qty 1

## 2011-04-28 MED ORDER — TUBERCULIN PPD 5 UNIT/0.1ML ID SOLN
5.0000 [IU] | Freq: Once | INTRADERMAL | Status: AC
  Administered 2011-04-28: 5 [IU] via INTRADERMAL
  Filled 2011-04-28 (×2): qty 0.1

## 2011-04-28 MED ORDER — SODIUM CHLORIDE 0.9 % IV BOLUS (SEPSIS): 1000.0000 mL | Freq: Once | INTRAVENOUS | Status: DC

## 2011-04-28 MED ORDER — BIOTENE DRY MOUTH MT LIQD
15.0000 mL | Freq: Two times a day (BID) | OROMUCOSAL | Status: DC
  Administered 2011-04-28 – 2011-05-15 (×34): 15 mL via OROMUCOSAL

## 2011-04-28 MED ORDER — ALBUTEROL SULFATE HFA 108 (90 BASE) MCG/ACT IN AERS: 2.0000 | INHALATION_SPRAY | RESPIRATORY_TRACT | Status: DC | PRN

## 2011-04-28 MED ORDER — ACETAMINOPHEN 325 MG PO TABS
650.0000 mg | ORAL_TABLET | Freq: Four times a day (QID) | ORAL | Status: DC | PRN
  Administered 2011-04-28 – 2011-05-15 (×12): 650 mg via ORAL
  Filled 2011-04-28 (×11): qty 2

## 2011-04-28 MED ORDER — BENZONATATE 100 MG PO CAPS
100.0000 mg | ORAL_CAPSULE | Freq: Three times a day (TID) | ORAL | Status: DC | PRN
  Administered 2011-04-28: 100 mg via ORAL
  Filled 2011-04-28 (×2): qty 1

## 2011-04-28 NOTE — ED Notes (Signed)
Attempted to call report to [phone], Nurse busy at the moment informed that she would call shortly.

## 2011-04-28 NOTE — Progress Notes (Signed)
FMTS Attending Note Patient's care discussed with resident team, I agree with plan as outlined by Dr. Elwyn Reach in her note.  Paula Compton, MD

## 2011-04-28 NOTE — Progress Notes (Signed)
Patient still complaining of lower back pain, not relieved with tylenol and heat packs.  Dr. [name] paged and notified.  Will continue to monitor.  Colman Cater

## 2011-04-28 NOTE — ED Notes (Signed)
PT GIVEN A CUP OF ICE AND SPOON

## 2011-04-28 NOTE — Progress Notes (Signed)
Pharmacy : Septra for r/o PCP PNA Order for septra single strength 2.5 tabs qid changed to septra DS 2 tabs tid.  This provides ~ 20 mg/kg/day of trimethoprim component. Thanks Herby Abraham, Pharm.D. [phone] 04/28/2011 1:52 PM

## 2011-04-28 NOTE — ED Provider Notes (Signed)
Medical screening examination/treatment/procedure(s) were conducted as a shared visit with non-physician practitioner(s) and myself.  I personally evaluated the patient during the encounter  SOB with cough x 5 days.  Recently treated for bronchitis.  Scattered fine crackles, hypoxic on RA, no distress, no accessory muscle use, no tachypnea.  Appears cachectic and chronically ill though claims to be other wise healthy.  Daniel Octave, MD 04/28/11 [phone]

## 2011-04-28 NOTE — H&P (Signed)
Daniel House is an 36 y.o. male.   Chief Complaint: Shortness of breath HPI: Daniel House presents to the hospital complaining of shortness of breath.  He says it has been going on 5 or 6 days and has slowly gotten worse.  He says today he felt like he couldn't breath so he came to the hospital.  He endorses a cough productive of white sputum, no blood.  He endorses fevers and chills.  He denies N/V/D, denies blood in stool.  He admits to poor appetite, denies dysuria, hematuria.  He denies sick contacts, and denies exposure to STD's and IV drug use.   History reviewed. No pertinent past medical history.  Past Surgical History  Procedure Date  . Appendectomy    Family History: No history of autoimmune disease, no history of lung disease.  Social History:  reports that he has been smoking.  He does not have any smokeless tobacco history on file. He reports that he drinks alcohol. He reports that he does not use illicit drugs. Patient has smoked about 1/3 ppd x 20 years.  He says he quit about a week ago.  He is currently unemployed.  Allergies: No Known Allergies  Medications Prior to Admission  Medication Dose Route Frequency Provider Last Rate Last Dose  . azithromycin (ZITHROMAX) 500 mg in dextrose 5 % 250 mL IVPB  500 mg Intravenous Once [employer], PA-C      . cefTRIAXone (ROCEPHIN) 1 g in dextrose 5 % 50 mL IVPB  1 g Intravenous Once Shaaron Adler, PA-C   1 g at 04/27/11 2358  . iohexol (OMNIPAQUE) 350 MG/ML injection 50 mL  50 mL Intravenous Once PRN Medication Radiologist, MD   50 mL at 04/27/11 2315  . sodium chloride 0.9 % bolus 1,000 mL  1,000 mL Intravenous Once Shaaron Adler, PA-C   1,000 mL at 04/27/11 2113  . sodium chloride 0.9 % bolus 1,000 mL  1,000 mL Intravenous Once [employer], PA-C   1,000 mL at 04/28/11 0002  . DISCONTD: iohexol (OMNIPAQUE) 300 MG/ML solution 100 mL  100 mL Intravenous Once PRN Medication Radiologist, MD        No current outpatient prescriptions on file as of 04/27/2011.    Results for orders placed during the hospital encounter of 04/27/11 (from the past 48 hour(s))  CBC     Status: Abnormal   Collection Time   04/27/11  8:23 PM      Component Value Range Comment   WBC 11.5 (*) 4.0 - 10.5 (K/uL)    RBC 3.58 (*) 4.22 - 5.81 (MIL/uL)    Hemoglobin 10.8 (*) 13.0 - 17.0 (g/dL)    HCT 40.9 (*) 81.1 - 52.0 (%)    MCV 89.4  78.0 - 100.0 (fL)    MCH 30.2  26.0 - 34.0 (pg)    MCHC 33.8  30.0 - 36.0 (g/dL)    RDW 91.4  78.2 - 95.6 (%)    Platelets 412 (*) 150 - 400 (K/uL)   BASIC METABOLIC PANEL     Status: Abnormal   Collection Time   04/27/11  8:23 PM      Component Value Range Comment   Sodium 135  135 - 145 (mEq/L)    Potassium 4.2  3.5 - 5.1 (mEq/L)    Chloride 99  96 - 112 (mEq/L)    CO2 23  19 - 32 (mEq/L)    Glucose, Bld 99  70 - 99 (mg/dL)  BUN 64 (*) 6 - 23 (mg/dL)    Creatinine, Ser 0.63 (*) 0.50 - 1.35 (mg/dL)    Calcium 9.2  8.4 - 10.5 (mg/dL)    GFR calc non Af Amer 43 (*) >90 (mL/min)    GFR calc Af Amer 50 (*) >90 (mL/min)   D-DIMER, QUANTITATIVE     Status: Abnormal   Collection Time   04/27/11  8:23 PM      Component Value Range Comment   D-Dimer, Quant 2.93 (*) 0.00 - 0.48 (ug/mL-FEU)   POCT I-STAT 3, BLOOD GAS (G3+)     Status: Abnormal   Collection Time   04/27/11  8:35 PM      Component Value Range Comment   pH, Arterial 7.428  7.350 - 7.450     pCO2 arterial 33.3 (*) 35.0 - 45.0 (mmHg)    pO2, Arterial 85.0  80.0 - 100.0 (mmHg)    Bicarbonate 22.0  20.0 - 24.0 (mEq/L)    TCO2 23  0 - 100 (mmol/L)    O2 Saturation 97.0      Acid-base deficit 2.0  0.0 - 2.0 (mmol/L)    Patient temperature 98.6 F      Collection site RADIAL, ALLEN'S TEST ACCEPTABLE      Drawn by RT      Sample type ARTERIAL     POCT I-STAT 3, BLOOD GAS (G3P V)     Status: Abnormal   Collection Time   04/27/11  9:08 PM      Component Value Range Comment   pH, Ven 7.360 (*) 7.250 - 7.300     pCO2,  Ven 41.2 (*) 45.0 - 50.0 (mmHg)    pO2, Ven 29.0 (*) 30.0 - 45.0 (mmHg)    Bicarbonate 23.3  20.0 - 24.0 (mEq/L)    TCO2 25  0 - 100 (mmol/L)    O2 Saturation 52.0      Acid-base deficit 2.0  0.0 - 2.0 (mmol/L)    Sample type VENOUS      Comment NOTIFIED PHYSICIAN      Dg Chest 2 View  04/27/2011  *RADIOLOGY REPORT*  Clinical Data: Shortness of breath.  Hypoxia.  Tachycardia.  CHEST - 2 VIEW  Comparison: 04/22/2011 and 02/24/2011.  No earlier exams.  Findings: Diffuse increased interstitial markings may represent interstitial infiltrate and / or pulmonary edema.  No pneumothorax.  Heart size within normal limits.  Prominent central pulmonary vasculature.  Bones appears slightly sclerotic.  IMPRESSION: Diffuse increased interstitial markings may represent interstitial infiltrate and / or pulmonary edema.  Original Report Authenticated By: Fuller Canada, M.D.   Ct Angio Chest W/cm &/or Wo Cm  04/27/2011  *RADIOLOGY REPORT*  Clinical Data: Hypoxia.  CT ANGIOGRAPHY CHEST  Technique:  Multidetector CT imaging of the chest using the standard protocol during bolus administration of intravenous contrast. Multiplanar reconstructed images including MIPs were obtained and reviewed to evaluate the vascular anatomy.  Contrast: 50mL OMNIPAQUE IOHEXOL 350 MG/ML SOLN  Comparison: 04/27/2011 chest x-ray.  No comparison chest CT.  Findings: Evaluation of pulmonary arteries in the lower lobes is limited secondary to respiratory motion.  No central or segmental pulmonary embolus noted.  Main pulmonary arteries prominence.  A component of pulmonary hypertension is therefore not excluded.  Diffuse air space disease with a mosaic pattern and superimposed nodular patchy changes lower lobes.  Matted mediastinal and hilar adenopathy.  Etiology of these findings is indeterminate.  Atypical infection not excluded.  Heart size top normal.  Bones appears  sclerotic diffusely.  Limited imaging of upper abdominal structures.   IMPRESSION: Evaluation of pulmonary arteries in the lower lobes is limited secondary to respiratory motion.  No central or segmental pulmonary embolus noted.  Main pulmonary arteries prominence.  A component of pulmonary hypertension is therefore not excluded.  Diffuse air space disease with a mosaic pattern and superimposed nodular patchy changes lower lobes.  Matted mediastinal and hilar adenopathy.  Etiology of these findings is indeterminate.  Atypical infection not excluded.  Bones appear sclerotic.  Original Report Authenticated By: Fuller Canada, M.D.    ROS: Pertinent items in HPI  Blood pressure 105/76, pulse 126, temperature 97.8 F (36.6 C), temperature source Oral, resp. rate 38, SpO2 86.00%. Physical Exam  Constitutional: He is oriented to person, place, and time. He appears cachectic. No distress.  HENT:  Head: Normocephalic and atraumatic.  Eyes: Conjunctivae and EOM are normal. Pupils are equal, round, and reactive to light.  Neck: Trachea normal and normal range of motion.  Cardiovascular: Regular rhythm and normal pulses.  Tachycardia present.   Respiratory:       Patient has fine crackles throughout lungs.  No focal rhonchi or wheezing.   GI: Soft. Bowel sounds are normal.  Musculoskeletal: Normal range of motion. He exhibits no edema.  Neurological: He is alert and oriented to person, place, and time. No cranial nerve deficit.  Skin: Skin is warm and dry.     Assessment/Plan 36 year old Male with no significant PMHx who presents with 1 week of dyspnea, found to be tachycardic and hypoxic in ER, imagining concerning for interstitial lung disease:  1) Pulmonary - imaging and clinical picture concerning for chronic lung disease.  At this time we will give supplemental Oxygen and treat for Community acquired pneumonia with rocephin and ceftriaxone.  Will start work up for interstitial lung disease- differential includes Pulmonary-renal syndromes, sarcoidosis, lupus. Patient  is cachectic appearing, which is concerning for infections such as TB and HIV, as well as for malignancy.  Urinalysis is ordered, will order ANCA labs, ANA, PPD, HIV tests. Will consider pulmonary consult to assist with work up and management.  2) Acute renal insufficiency- may be due to dehydration +/- NSAID use, but again concerning for pulmonary-renal cause of illness.  Will continue to hydrate with IVF, UA is pending, hold NSAIDS.  Will Re-check creatinine in am.  3) FEN/GI- patient appears malnourished, will obtain CMET to see protein and albumin.  Will allow regular diet, give zofran prn, and protonix for GI prophylaxis.  NS@125 , recheck lytes in am.  4) DVT ppx- SQ heparin 5) Code- patient is a full code 6) Disposition- pending clinical improvement and pulmonary work up.   Sashia Campas 04/28/2011, 12:23 AM

## 2011-04-28 NOTE — ED Notes (Signed)
Attempted to call report to [phone], was informed nurse would call back to receive report in a few minutes.

## 2011-04-28 NOTE — Progress Notes (Signed)
FMTS Daily Intern Progress Note  Subjective: Doing okay this morning.  No acute complaints.  Did not eat much of breakfast this morning.  Continues to have cough, chills, sweats.  No history of IV drug use.  Is bisexual, but last partner was a month ago.   I have reviewed the patient's medications.  Objective Temp:  [97.8 F (36.6 C)-100 F (37.8 C)] 98.4 F (36.9 C) (04/08 1400) Pulse Rate:  [102-126] 102  (04/08 1400) Resp:  [18-38] 18  (04/08 1400) BP: (94-106)/(59-76) 94/59 mmHg (04/08 1400) SpO2:  [86 %-100 %] 96 % (04/08 1400) FiO2 (%):  [3 %] 3 % (04/08 0400) Weight:  [103 lb 13.4 oz (47.1 kg)] 103 lb 13.4 oz (47.1 kg) (04/08 0400)   Intake/Output Summary (Last 24 hours) at 04/28/11 1420 Last data filed at 04/28/11 0700  Gross per 24 hour  Intake    675 ml  Output    100 ml  Net    575 ml    CBG (last 3)  No results found for this basename: GLUCAP:3 in the last 72 hours  General: awake, alert, no distress, [place] in place HEENT: AT/[place], sclera white, MMM Lymph: no cervical, supraclavicular, axillary LAD; lymph nodes palpable in bilateral groin, but no enlarged. CV: RRR, no murmurs Pulm: good air movement, fine rales at bilateral bases Abd: +BS, NTND, no hepatosplenomegaly Ext: no edema, warm Neuro: alert, oriented  Labs and Imaging  Lab 04/28/11 0635 04/27/11 2023  WBC 8.6 11.5*  HGB 9.5* 10.8*  HCT 29.3* 32.0*  PLT 395 412*    Lab 04/28/11 0635 04/27/11 2023  NA 133* 135  K 4.6 4.2  CL 102 99  CO2 21 23  BUN 51* 64*  CREATININE 1.62* 1.93*  CALCIUM 8.5 9.2  PROT 7.3 --  BILITOT 0.2* --  ALKPHOS 114 --  ALT 7 --  AST 48* --  GLUCOSE 119* 99   TSH 0.792 ACE 38  Assessment and Plan 36 year old Male with no significant PMHx who presents with 1 week of dyspnea, found to be tachycardic and hypoxic in ER, imagining concerning for interstitial lung disease:   # Pulmonary - Imaging and clinical picture concerning for chronic lung disease.  Cannot rule  out superimposed CAP.   DDx for interstitial lung disease pulmonary-renal syndromes, sarcoidosis, lupus. Patient is cachectic appearing, raising concern for infections such as TB and HIV (with PCP pneumonia), as well as for malignancy.  Will consider pulmonary consult to assist with work up and management.  -continue Azithromycin and ceftriaxone -continue O2, wean as tolerated -f/u labs: ANCA, PPD, HIV, sputum cx -will check A1AT if HIV negative -will add Bactrim DS to cover for PCP until HIV comes back -consider ID vs pulm consult  # Acute renal insufficiency- may be due to dehydration +/- NSAID use, but again concerning for pulmonary-renal cause of illness. UA significant for spec grav at 1.040, 100 of protein, and red cells.  Creatinine is trending down after fluids.  -continue to hydrate with IVFs -avoid NSAIDs -f/u pulm-renal labs -continue to monitor creatinine -1L NS bolus -consider renal consult  # FEN/GI- patient appears malnourished, albumin low at 2.1. Will allow regular diet, give zofran prn, and protonix for GI prophylaxis. NS @ 125cc/hr -nutrition consult  DVT ppx- SQ heparin  Code- patient is a full code  Disposition- pending clinical improvement and pulmonary work up.    Despina Hick Pager: [phone] 04/28/2011, 2:20 PM

## 2011-04-28 NOTE — H&P (Signed)
I interviewed and examined this patient and discussed the care plan with Dr. Lula House and the [facility] team and agree with assessment and plan as documented in the admission note for today. He reports a history of bisexual activity, but denies iv drug use. His maximum weight was 145 lb, but he can't say when he last weighed that much. With the mediastinal adenopathy, sarcoid, TB and lymphoma remain within the differential diagnoses, but HIV with PCP infection is highest on my list. We will watch his oxygenation carefully and add corticosteroids and TMP/Sulfa if he is HIV positive or is deteriorating.     Daniel House A. Sheffield Slider, MD Family Medicine Teaching Service Attending  04/28/2011 9:52 AM

## 2011-04-28 NOTE — ED Notes (Signed)
[phone] not suitable for pt since pt is being ruled out for TB. Flow manager notified, pt will be assigned new room.

## 2011-04-28 NOTE — Progress Notes (Signed)
INITIAL ADULT NUTRITION ASSESSMENT Date: 04/28/2011   Time: 11:08 AM  Reason for Assessment: Consult  ASSESSMENT: Male 36 y.o.  Dx: shortness of breath  Hx: History reviewed. No pertinent past medical history.  Related Meds:     . antiseptic oral rinse  15 mL Mouth Rinse BID  . azithromycin  500 mg Intravenous Once  . cefTRIAXone (ROCEPHIN)  IV  1 g Intravenous Once  . heparin  5,000 Units Subcutaneous Q8H  . pneumococcal 23 valent vaccine  0.5 mL Intramuscular Tomorrow-1000  . sodium chloride  1,000 mL Intravenous Once  . sodium chloride  1,000 mL Intravenous Once  . sodium chloride  1,000 mL Intravenous Once  . sodium chloride  3 mL Intravenous Q12H  . sulfamethoxazole-trimethoprim  1 tablet Oral Daily  . tuberculin  5 Units Intradermal Once    Ht: 6' (182.9 cm)  Wt: 103 lb 13.4 oz (47.1 kg)  Ideal Wt: 81 kg % Ideal Wt: 58%  Usual Wt: ~ 118 lb % Usual Wt: 87%  Body mass index is 14.08 kg/(m^2).  Food/Nutrition Related Hx: unintentional weight loss > 10 lbs within the past month  Labs:  CMP     Component Value Date/Time   NA 133* 04/28/2011 0635   K 4.6 04/28/2011 0635   CL 102 04/28/2011 0635   CO2 21 04/28/2011 0635   GLUCOSE 119* 04/28/2011 0635   BUN 51* 04/28/2011 0635   CREATININE 1.62* 04/28/2011 0635   CALCIUM 8.5 04/28/2011 0635   PROT 7.3 04/28/2011 0635   ALBUMIN 2.1* 04/28/2011 0635   AST 48* 04/28/2011 0635   ALT 7 04/28/2011 0635   ALKPHOS 114 04/28/2011 0635   BILITOT 0.2* 04/28/2011 0635   GFRNONAA 53* 04/28/2011 0635   GFRAA 62* 04/28/2011 0635     Intake/Output Summary (Last 24 hours) at 04/28/11 1112 Last data filed at 04/28/11 0700  Gross per 24 hour  Intake    675 ml  Output    100 ml  Net    575 ml    Diet Order: Heart Healthy  Supplements/Tube Feeding: N/A  IVF:    sodium chloride Last Rate: 125 mL/hr at 04/28/11 1013    Estimated Nutritional Needs:   Kcal: 1500-1700 Protein: 70-80 gm Fluid: > 1.5 L  RD spoke with pt re: nutrition hx --  states his appetite is poor and was PTA; states he was snacking throughout the day vs consuming meals -- for example "couple bites of a biscuit"-- RD interprets this as </= 50% intake of estimated energy needs; + nausea; he reports a 10-15 lb weight loss x 1 month; pt with visible subcutaneous fat loss; no documented PO intake in flowsheet records; no skin breakdown noted; meets criteria for underweight status & severe malnutrition in the context of acute illness; would benefit from addition of supplements -- pt amenable -- RD to order.  NUTRITION DIAGNOSIS: -Inadequate oral intake (NI-2.1).  Status: Ongoing  RELATED TO: poor appetite  AS EVIDENCE BY: pt report  MONITORING/EVALUATION(Goals): Goal: meet >90% of estimated nutrition needs to promote energy & protein repletion Monitor: PO intake, labs, weight, I/O's  EDUCATION NEEDS: -No education needs identified at this time  INTERVENTION:  Add Ensure Complete PO BID (350 kcals, 13 gm protein per 8 fl oz bottle)  RD to follow for nutrition care plan  Dietitian #: [phone]  DOCUMENTATION CODES Per approved criteria  -Severe malnutrition in the context of acute illness or injury -Underweight    [name] 04/28/2011,  11:08 AM

## 2011-04-29 DIAGNOSIS — B59 Pneumocystosis: Secondary | ICD-10-CM

## 2011-04-29 DIAGNOSIS — B20 Human immunodeficiency virus [HIV] disease: Secondary | ICD-10-CM

## 2011-04-29 LAB — CBC
MCH: 29.4 pg (ref 26.0–34.0)
MCHC: 32.4 g/dL (ref 30.0–36.0)
MCV: 90.7 fL (ref 78.0–100.0)
Platelets: 401 10*3/uL — ABNORMAL HIGH (ref 150–400)
RDW: 14.8 % (ref 11.5–15.5)

## 2011-04-29 LAB — BASIC METABOLIC PANEL
Calcium: 8 mg/dL — ABNORMAL LOW (ref 8.4–10.5)
Creatinine, Ser: 1.62 mg/dL — ABNORMAL HIGH (ref 0.50–1.35)
GFR calc non Af Amer: 53 mL/min — ABNORMAL LOW (ref >90)
Sodium: 137 mEq/L (ref 135–145)

## 2011-04-29 LAB — LACTATE DEHYDROGENASE: LDH: 715 U/L — ABNORMAL HIGH (ref 94–250)

## 2011-04-29 MED ORDER — SULFAMETHOXAZOLE-TRIMETHOPRIM 400-80 MG/5ML IV SOLN
255.0000 mg | Freq: Three times a day (TID) | INTRAVENOUS | Status: DC
  Administered 2011-04-29 – 2011-05-01 (×7): 255 mg via INTRAVENOUS
  Filled 2011-04-29 (×15): qty 15.9

## 2011-04-29 MED ORDER — IPRATROPIUM BROMIDE 0.02 % IN SOLN
0.5000 mg | RESPIRATORY_TRACT | Status: DC
  Administered 2011-04-30 (×2): 0.5 mg via RESPIRATORY_TRACT
  Filled 2011-04-29 (×3): qty 2.5

## 2011-04-29 MED ORDER — PREDNISONE 20 MG PO TABS
40.0000 mg | ORAL_TABLET | Freq: Every day | ORAL | Status: DC
  Administered 2011-04-29: 40 mg via ORAL
  Filled 2011-04-29 (×2): qty 2

## 2011-04-29 MED ORDER — ALBUTEROL SULFATE (5 MG/ML) 0.5% IN NEBU
2.5000 mg | INHALATION_SOLUTION | RESPIRATORY_TRACT | Status: DC
  Administered 2011-04-30 (×2): 2.5 mg via RESPIRATORY_TRACT
  Filled 2011-04-29 (×3): qty 0.5

## 2011-04-29 MED ORDER — PREDNISONE 20 MG PO TABS
40.0000 mg | ORAL_TABLET | Freq: Two times a day (BID) | ORAL | Status: DC
  Administered 2011-04-29 – 2011-04-30 (×2): 40 mg via ORAL
  Filled 2011-04-29 (×4): qty 2

## 2011-04-29 NOTE — Progress Notes (Signed)
FMTS attending Note  Patient seen and examined by me, discussed with resident team and I agree with Dr Algis Downs assessment/plan as documented.  Patient reports that he is not feeling much better.  Of note, temp spikes to 101.1 this morning.  HIV ELISA back reactive, awaiting Western blot.  Plan for continuation Bactrim for likely PCP pneumonia; induce sputum for gram stain and AFB, culture, CD4 count.  Infectious Disease consult to weigh in on whether to get HIV viral load.genotyping before WB is available.  Also, other opportunistic infections to consider in patient with HIV/AIDS. Paula Compton, MD

## 2011-04-29 NOTE — Progress Notes (Signed)
Patient ID: Daniel House, male   DOB: March 12, 1975, 36 y.o.   MRN: [id number]  PGY-1 Daily Progress Note Family Medicine Teaching Service Ikhlas Albo M. Avyon Herendeen, MD Service Pager: [phone]   Subjective:  Patient states he continues to feel poorly this morning. States he has been OOB ambulating around the room. Did not have breakfast this morning due to decreased appetite. Continues to have cough with some sputum. Febrile to 100.6 this morning. Only complains of back pain.  I have reviewed the patient's medications.  Objective Temp:  [98.1 F (36.7 C)-101.1 F (38.4 C)] 101.1 F (38.4 C) (04/09 0950) Pulse Rate:  [74-102] 74  (04/09 0604) Resp:  [18-20] 20  (04/09 0604) BP: (92-104)/(58-62) 104/62 mmHg (04/09 0604) SpO2:  [90 %-99 %] 90 % (04/09 0604) Weight:  [112 lb 3.2 oz (50.894 kg)] 112 lb 3.2 oz (50.894 kg) (04/09 0604)   Intake/Output Summary (Last 24 hours) at 04/29/11 1011 Last data filed at 04/28/11 2015  Gross per 24 hour  Intake   2250 ml  Output    150 ml  Net   2100 ml   General: awake, alert, no distress, [place] in place HEENT: AT/[place], sclera white, MMM Lymph: no cervical, supraclavicular LAD CV: RRR, no murmurs Pulm: good air movement, diffuse wheezes and some crackles noted at bases Abd: firm abdomen but not tense (likely secondary to body habitus) +BS, NTND, no hepatosplenomegaly Ext: no edema, warm Neuro: alert, oriented  Labs and Imaging  Lab 04/29/11 0651 04/28/11 0635 04/27/11 2023  WBC 10.6* 8.6 11.5*  HGB 8.5* 9.5* 10.8*  HCT 26.2* 29.3* 32.0*  PLT 401* 395 412*    Lab 04/29/11 0651 04/28/11 0635 04/27/11 2023  NA 137 133* 135  K 4.4 4.6 4.2  CL 111 102 99  CO2 17* 21 23  BUN 38* 51* 64*  CREATININE 1.62* 1.62* 1.93*  CALCIUM 8.0* 8.5 9.2  PROT -- 7.3 --  BILITOT -- 0.2* --  ALKPHOS -- 114 --  ALT -- 7 --  AST -- 48* --  GLUCOSE 96 119* 99   TSH 0.792 ACE 38  Assessment and Plan 36 year old Male with no significant PMHx who presents with 1  week of dyspnea, found to be tachycardic and hypoxic in ER, imagining concerning for interstitial lung disease:   1. Pulmonary - Imaging and clinical picture concerning for chronic lung disease.  Cannot rule out superimposed CAP.   DDx for interstitial lung disease pulmonary-renal syndromes, sarcoidosis, lupus. Patient is cachectic appearing, raising concern for infections such as TB and HIV (with PCP pneumonia), as well as for malignancy.   -HIV positive. Awaiting confirmation. -Consulted ID this morning, who will see patient later today. We appreciate their help with the care of this patient. -Not improving greatly with Bactrim PO. At recs of ID, will switch to Bactrim IV 15 mg/kg of TMP divided q8 hours -Will start Prednisone 40mg  daily. -continue O2, wean as tolerated -f/u labs: ANCA, PPD, sputum cx  2. Acute renal insufficiency- may be due to dehydration +/- NSAID use, but again concerning for pulmonary-renal cause of illness. UA significant for spec grav at 1.040, 100 of protein, and red cells.  Creatinine is trending down after fluids. BUN improving. -continue to hydrate with IVFs -avoid NSAIDs -continue to monitor creatinine (s/p bolus and MIVF) -consider renal consult if patient worsens  3. FEN/GI- patient appears malnourished, albumin low at 2.1. Will allow regular diet, give zofran prn, and protonix for GI prophylaxis. NS @ 125cc/hr -  nutrition consult  4. DVT ppx- SQ heparin   5. Disposition- pending clinical improvement and pulmonary work up.   [name], Dereona Kolodny Pager: [phone] 04/29/2011, 10:11 AM

## 2011-04-29 NOTE — Consult Note (Signed)
Date of Admission:  04/27/2011  Date of Consult:  04/29/2011  Reason for Consult: pt with HIV/AIDs and likely PCP pneumonia Referring Physician: Dr. Sheffield Slider   HPI: Daniel House is an 36 y.o. male with newly diagnosed HIV (ELISA back but not WB yet) who presents with a approximately 4 month history of worsening dyspnea on exertion with cough productive of clear phlegm. He states that over the last several weeks the cough has worsened and he became progressively more dyspneic to the point within the last week or even getting up out of his bed to walk across the room made him profoundly out of breath. He had fevers chills and appears to have lost weight although he himself cannot quantify this for [place].   ON admission he was found on CTA to have diffuse hazy opacities throughout  Lungs with nodules. And hilar lymphadenopathy He was hypoxic on admission and was started on TMP/SMX for possible PCP, as well as rocephin and azithro for possible CAP (though CT is not consistent with the latter). On ABG his PO2 was 85 but not clear if this was on supplemental O2? Or not.  He is currently in airborne isolation for possible TB. His HIV EIA is positive, WB is pending. He is still febrile and feeling poorly despite TMP/SMX, steroids were started at my request today.  He currently lives with 2 sisters in the area. He denies that either of them have been sick. He did have spent time in jail for a few days previously but otherwise has no known risk factors for tuberculosis aside from his newly diagnosed HIV. His travel has been only within the [place] and the [place]. He does admit to having sex with both men and women he denies ivdu. He is a smoker and drinks 1-2 beers a day typically.    History reviewed. No pertinent past medical history.  Past Surgical History  Procedure Date  . Appendectomy   ergies:   No Known Allergies   Medications: I have reviewed patients current medications as  documented in Epic Anti-infectives     Start     Dose/Rate Route Frequency Ordered Stop   04/29/11 1100  sulfamethoxazole-trimethoprim (BACTRIM) 255 mg in dextrose 5 % 250 mL IVPB       255 mg 265.9 mL/hr over 60 Minutes Intravenous Every 8 hours 04/29/11 1008     04/28/11 1600   sulfamethoxazole-trimethoprim (BACTRIM DS) 800-160 MG per tablet 2 tablet  Status:  Discontinued        2 tablet Oral 3 times daily 04/28/11 1348 04/29/11 1008   04/28/11 1330   sulfamethoxazole-trimethoprim (BACTRIM,SEPTRA) 400-80 MG per tablet 2.5 tablet  Status:  Discontinued        2.5 tablet Oral 4 times per day 04/28/11 1224 04/28/11 1348   04/28/11 1100   sulfamethoxazole-trimethoprim (BACTRIM DS) 800-160 MG per tablet 1 tablet  Status:  Discontinued        1 tablet Oral Daily 04/28/11 1046 04/28/11 1224   04/27/11 2345   cefTRIAXone (ROCEPHIN) 1 g in dextrose 5 % 50 mL IVPB        1 g 100 mL/hr over 30 Minutes Intravenous  Once 04/27/11 2338 04/28/11 0028   04/27/11 2345   azithromycin (ZITHROMAX) 500 mg in dextrose 5 % 250 mL IVPB        500 mg 250 mL/hr over 60 Minutes Intravenous  Once 04/27/11 2338 04/28/11 0128  Social History:  reports that he quit smoking 8 days ago. He does not have any smokeless tobacco history on file. He reports that he drinks alcohol. He reports that he does not use illicit drugs.  No family history on file.  As in HPI and primary teams notes otherwise 12 point review of systems is negative  Blood pressure 103/63, pulse 108, temperature 98.3 F (36.8 C), temperature source Oral, resp. rate 17, height 6' (1.829 m), weight 112 lb 3.2 oz (50.894 kg), SpO2 90.00%. General: Alert and awake, oriented x3, not in any acute distress, cachectic HEENT: anicteric sclera, pupils reactive to light and accommodation, EOMI, oropharynx with coating on tongue  CVStachycardic , normal r,  no murmur rubs or gallops Chest: wheezes, and crackles prominent posteriorly Abdomen:  soft nontender, nondistended, normal bowel sounds, Extremities: no  clubbing or edema noted bilaterally Skin: no rashes Neuro: nonfocal, strength and sensation intact   Results for orders placed during the hospital encounter of 04/27/11 (from the past 48 hour(s))  CBC     Status: Abnormal   Collection Time   04/27/11  8:23 PM      Component Value Range Comment   WBC 11.5 (*) 4.0 - 10.5 (K/uL)    RBC 3.58 (*) 4.22 - 5.81 (MIL/uL)    Hemoglobin 10.8 (*) 13.0 - 17.0 (g/dL)    HCT 56.2 (*) 13.0 - 52.0 (%)    MCV 89.4  78.0 - 100.0 (fL)    MCH 30.2  26.0 - 34.0 (pg)    MCHC 33.8  30.0 - 36.0 (g/dL)    RDW 86.5  78.4 - 69.6 (%)    Platelets 412 (*) 150 - 400 (K/uL)   BASIC METABOLIC PANEL     Status: Abnormal   Collection Time   04/27/11  8:23 PM      Component Value Range Comment   Sodium 135  135 - 145 (mEq/L)    Potassium 4.2  3.5 - 5.1 (mEq/L)    Chloride 99  96 - 112 (mEq/L)    CO2 23  19 - 32 (mEq/L)    Glucose, Bld 99  70 - 99 (mg/dL)    BUN 64 (*) 6 - 23 (mg/dL)    Creatinine, Ser 2.95 (*) 0.50 - 1.35 (mg/dL)    Calcium 9.2  8.4 - 10.5 (mg/dL)    GFR calc non Af Amer 43 (*) >90 (mL/min)    GFR calc Af Amer 50 (*) >90 (mL/min)   D-DIMER, QUANTITATIVE     Status: Abnormal   Collection Time   04/27/11  8:23 PM      Component Value Range Comment   D-Dimer, Quant 2.93 (*) 0.00 - 0.48 (ug/mL-FEU)   POCT I-STAT 3, BLOOD GAS (G3+)     Status: Abnormal   Collection Time   04/27/11  8:35 PM      Component Value Range Comment   pH, Arterial 7.428  7.350 - 7.450     pCO2 arterial 33.3 (*) 35.0 - 45.0 (mmHg)    pO2, Arterial 85.0  80.0 - 100.0 (mmHg)    Bicarbonate 22.0  20.0 - 24.0 (mEq/L)    TCO2 23  0 - 100 (mmol/L)    O2 Saturation 97.0      Acid-base deficit 2.0  0.0 - 2.0 (mmol/L)    Patient temperature 98.6 F      Collection site RADIAL, ALLEN'S TEST ACCEPTABLE      Drawn by RT      Sample type  ARTERIAL     POCT I-STAT 3, BLOOD GAS (G3P V)     Status: Abnormal   Collection  Time   04/27/11  9:08 PM      Component Value Range Comment   pH, Ven 7.360 (*) 7.250 - 7.300     pCO2, Ven 41.2 (*) 45.0 - 50.0 (mmHg)    pO2, Ven 29.0 (*) 30.0 - 45.0 (mmHg)    Bicarbonate 23.3  20.0 - 24.0 (mEq/L)    TCO2 25  0 - 100 (mmol/L)    O2 Saturation 52.0      Acid-base deficit 2.0  0.0 - 2.0 (mmol/L)    Sample type VENOUS      Comment NOTIFIED PHYSICIAN     HIV ANTIBODY (ROUTINE TESTING)     Status: Abnormal   Collection Time   04/28/11 12:25 AM      Component Value Range Comment   HIV Reactive (*) NON REACTIVE    CULTURE, BLOOD (ROUTINE X 2)     Status: Normal (Preliminary result)   Collection Time   04/28/11 12:30 AM      Component Value Range Comment   Specimen Description BLOOD RIGHT ARM      Special Requests BOTTLES DRAWN AEROBIC ONLY 5CC      Culture  Setup Time [id number]      Culture        Value:        BLOOD CULTURE RECEIVED NO GROWTH TO DATE CULTURE WILL BE HELD FOR 5 DAYS BEFORE ISSUING A FINAL NEGATIVE REPORT   Report Status PENDING     CULTURE, BLOOD (ROUTINE X 2)     Status: Normal (Preliminary result)   Collection Time   04/28/11 12:40 AM      Component Value Range Comment   Specimen Description BLOOD RIGHT WRIST      Special Requests BOTTLES DRAWN AEROBIC ONLY 2CC      Culture  Setup Time [id number]      Culture        Value:        BLOOD CULTURE RECEIVED NO GROWTH TO DATE CULTURE WILL BE HELD FOR 5 DAYS BEFORE ISSUING A FINAL NEGATIVE REPORT   Report Status PENDING     MRSA PCR SCREENING     Status: Normal   Collection Time   04/28/11  4:14 AM      Component Value Range Comment   MRSA by PCR NEGATIVE  NEGATIVE    URINALYSIS, ROUTINE W REFLEX MICROSCOPIC     Status: Abnormal   Collection Time   04/28/11  6:00 AM      Component Value Range Comment   Color, Urine YELLOW  YELLOW     APPearance CLEAR  CLEAR     Specific Gravity, Urine 1.040 (*) 1.005 - 1.030     pH 5.5  5.0 - 8.0     Glucose, UA NEGATIVE  NEGATIVE (mg/dL)    Hgb urine dipstick  MODERATE (*) NEGATIVE     Bilirubin Urine NEGATIVE  NEGATIVE     Ketones, ur NEGATIVE  NEGATIVE (mg/dL)    Protein, ur 213 (*) NEGATIVE (mg/dL)    Urobilinogen, UA 1.0  0.0 - 1.0 (mg/dL)    Nitrite NEGATIVE  NEGATIVE     Leukocytes, UA NEGATIVE  NEGATIVE    URINE MICROSCOPIC-ADD ON     Status: Normal   Collection Time   04/28/11  6:00 AM      Component Value Range Comment   Squamous  Epithelial / LPF RARE  RARE     WBC, UA 0-2  <3 (WBC/hpf)    RBC / HPF 7-10  <3 (RBC/hpf)    Bacteria, UA RARE  RARE    CBC     Status: Abnormal   Collection Time   04/28/11  6:35 AM      Component Value Range Comment   WBC 8.6  4.0 - 10.5 (K/uL)    RBC 3.21 (*) 4.22 - 5.81 (MIL/uL)    Hemoglobin 9.5 (*) 13.0 - 17.0 (g/dL)    HCT 40.9 (*) 81.1 - 52.0 (%)    MCV 91.3  78.0 - 100.0 (fL)    MCH 29.6  26.0 - 34.0 (pg)    MCHC 32.4  30.0 - 36.0 (g/dL)    RDW 91.4  78.2 - 95.6 (%)    Platelets 395  150 - 400 (K/uL)   COMPREHENSIVE METABOLIC PANEL     Status: Abnormal   Collection Time   04/28/11  6:35 AM      Component Value Range Comment   Sodium 133 (*) 135 - 145 (mEq/L)    Potassium 4.6  3.5 - 5.1 (mEq/L)    Chloride 102  96 - 112 (mEq/L)    CO2 21  19 - 32 (mEq/L)    Glucose, Bld 119 (*) 70 - 99 (mg/dL)    BUN 51 (*) 6 - 23 (mg/dL)    Creatinine, Ser 2.13 (*) 0.50 - 1.35 (mg/dL)    Calcium 8.5  8.4 - 10.5 (mg/dL)    Total Protein 7.3  6.0 - 8.3 (g/dL)    Albumin 2.1 (*) 3.5 - 5.2 (g/dL)    AST 48 (*) 0 - 37 (U/L)    ALT 7  0 - 53 (U/L)    Alkaline Phosphatase 114  39 - 117 (U/L)    Total Bilirubin 0.2 (*) 0.3 - 1.2 (mg/dL)    GFR calc non Af Amer 53 (*) >90 (mL/min)    GFR calc Af Amer 62 (*) >90 (mL/min)   ANA     Status: Normal   Collection Time   04/28/11  6:35 AM      Component Value Range Comment   ANA NEGATIVE  NEGATIVE    MPO/PR-3 (ANCA) ANTIBODIES     Status: Normal   Collection Time   04/28/11  6:35 AM      Component Value Range Comment   Myeloperoxidase Abs 1  <20 (AU/mL)    Serine  Protease 3 2  <20 (AU/mL)   ANGIOTENSIN CONVERTING ENZYME     Status: Normal   Collection Time   04/28/11  6:35 AM      Component Value Range Comment   Angiotensin-Converting Enzyme 38  8 - 52 (U/L)   TSH     Status: Normal   Collection Time   04/28/11  6:35 AM      Component Value Range Comment   TSH 0.792  0.350 - 4.500 (uIU/mL)   CBC     Status: Abnormal   Collection Time   04/29/11  6:51 AM      Component Value Range Comment   WBC 10.6 (*) 4.0 - 10.5 (K/uL)    RBC 2.89 (*) 4.22 - 5.81 (MIL/uL)    Hemoglobin 8.5 (*) 13.0 - 17.0 (g/dL)    HCT 08.6 (*) 57.8 - 52.0 (%)    MCV 90.7  78.0 - 100.0 (fL)    MCH 29.4  26.0 - 34.0 (pg)  MCHC 32.4  30.0 - 36.0 (g/dL)    RDW 86.5  78.4 - 69.6 (%)    Platelets 401 (*) 150 - 400 (K/uL)   BASIC METABOLIC PANEL     Status: Abnormal   Collection Time   04/29/11  6:51 AM      Component Value Range Comment   Sodium 137  135 - 145 (mEq/L)    Potassium 4.4  3.5 - 5.1 (mEq/L)    Chloride 111  96 - 112 (mEq/L)    CO2 17 (*) 19 - 32 (mEq/L)    Glucose, Bld 96  70 - 99 (mg/dL)    BUN 38 (*) 6 - 23 (mg/dL)    Creatinine, Ser 2.95 (*) 0.50 - 1.35 (mg/dL)    Calcium 8.0 (*) 8.4 - 10.5 (mg/dL)    GFR calc non Af Amer 53 (*) >90 (mL/min)    GFR calc Af Amer 62 (*) >90 (mL/min)       Component Value Date/Time   SDES BLOOD RIGHT WRIST 04/28/2011 0040   SPECREQUEST BOTTLES DRAWN AEROBIC ONLY 2CC 04/28/2011 0040   CULT        BLOOD CULTURE RECEIVED NO GROWTH TO DATE CULTURE WILL BE HELD FOR 5 DAYS BEFORE ISSUING A FINAL NEGATIVE REPORT 04/28/2011 0040   REPTSTATUS PENDING 04/28/2011 0040   Dg Chest 2 View  04/27/2011  *RADIOLOGY REPORT*  Clinical Data: Shortness of breath.  Hypoxia.  Tachycardia.  CHEST - 2 VIEW  Comparison: 04/22/2011 and 02/24/2011.  No earlier exams.  Findings: Diffuse increased interstitial markings may represent interstitial infiltrate and / or pulmonary edema.  No pneumothorax.  Heart size within normal limits.  Prominent central pulmonary  vasculature.  Bones appears slightly sclerotic.  IMPRESSION: Diffuse increased interstitial markings may represent interstitial infiltrate and / or pulmonary edema.  Original Report Authenticated By: Fuller Canada, M.D.   Ct Angio Chest W/cm &/or Wo Cm  04/27/2011  *RADIOLOGY REPORT*  Clinical Data: Hypoxia.  CT ANGIOGRAPHY CHEST  Technique:  Multidetector CT imaging of the chest using the standard protocol during bolus administration of intravenous contrast. Multiplanar reconstructed images including MIPs were obtained and reviewed to evaluate the vascular anatomy.  Contrast: 50mL OMNIPAQUE IOHEXOL 350 MG/ML SOLN  Comparison: 04/27/2011 chest x-ray.  No comparison chest CT.  Findings: Evaluation of pulmonary arteries in the lower lobes is limited secondary to respiratory motion.  No central or segmental pulmonary embolus noted.  Main pulmonary arteries prominence.  A component of pulmonary hypertension is therefore not excluded.  Diffuse air space disease with a mosaic pattern and superimposed nodular patchy changes lower lobes.  Matted mediastinal and hilar adenopathy.  Etiology of these findings is indeterminate.  Atypical infection not excluded.  Heart size top normal.  Bones appears sclerotic diffusely.  Limited imaging of upper abdominal structures.  IMPRESSION: Evaluation of pulmonary arteries in the lower lobes is limited secondary to respiratory motion.  No central or segmental pulmonary embolus noted.  Main pulmonary arteries prominence.  A component of pulmonary hypertension is therefore not excluded.  Diffuse air space disease with a mosaic pattern and superimposed nodular patchy changes lower lobes.  Matted mediastinal and hilar adenopathy.  Etiology of these findings is indeterminate.  Atypical infection not excluded.  Bones appear sclerotic.  Original Report Authenticated By: Fuller Canada, M.D.     Recent Results (from the past 720 hour(s))  CULTURE, BLOOD (ROUTINE X 2)     Status: Normal  (Preliminary result)   Collection Time  04/28/11 12:30 AM      Component Value Range Status Comment   Specimen Description BLOOD RIGHT ARM   Final    Special Requests BOTTLES DRAWN AEROBIC ONLY 5CC   Final    Culture  Setup Time [id number]   Final    Culture     Final    Value:        BLOOD CULTURE RECEIVED NO GROWTH TO DATE CULTURE WILL BE HELD FOR 5 DAYS BEFORE ISSUING A FINAL NEGATIVE REPORT   Report Status PENDING   Incomplete   CULTURE, BLOOD (ROUTINE X 2)     Status: Normal (Preliminary result)   Collection Time   04/28/11 12:40 AM      Component Value Range Status Comment   Specimen Description BLOOD RIGHT WRIST   Final    Special Requests BOTTLES DRAWN AEROBIC ONLY 2CC   Final    Culture  Setup Time [id number]   Final    Culture     Final    Value:        BLOOD CULTURE RECEIVED NO GROWTH TO DATE CULTURE WILL BE HELD FOR 5 DAYS BEFORE ISSUING A FINAL NEGATIVE REPORT   Report Status PENDING   Incomplete   MRSA PCR SCREENING     Status: Normal   Collection Time   04/28/11  4:14 AM      Component Value Range Status Comment   MRSA by PCR NEGATIVE  NEGATIVE  Final      Impression/Recommendation 36 year old AA man with newly diagnosed HIV/AIDS and pulmonary process highly suspicious for PCP pneumonia  1) Pneumonia: Not lobar or c/w CAP. Seems most c/w PCP. Cryptococcal infection is certainly possible as is histoplasmosis. TB also possible though imaging characteristics not classic, certainly possible. Agree that sarcoid is in differential. Malignancy as well  --would DC rocephin, and dc azithromycin (esp if wishing to rule out tb as it may reduce yield on cultures) --continue IV Septra plus steroids with prednisone increased to 40mg  bid for now --check sputum PCP smear --check serum cryptococcal antigen --check serum LDH --urine histoplasma ag --would induce for AFB smears x2 if concern for TB   2) HIV/AIDS:  --will order viral load and genotype --will order hlab 5701 in  case want to use abacavir --given renal insufficiency would avoid Tenofovir regimen in this pt at this point --he will need Emergency ADAP application (but cannot be processed until Viral load back) --ADAP coming in may be rate limiting step to starting ARV --which should be done as soon as possible (unless this is TB   3) Renal insufficiency: could be multifactorial.  But concern for HIVAN. Could consider 24 hr urine    Thank you so much for this interesting consult,   Acey Lav 04/29/2011, 3:41 PM   [phone] (pager) [phone] (office)

## 2011-04-30 ENCOUNTER — Inpatient Hospital Stay (HOSPITAL_COMMUNITY): Payer: Medicaid Other

## 2011-04-30 DIAGNOSIS — B20 Human immunodeficiency virus [HIV] disease: Principal | ICD-10-CM

## 2011-04-30 DIAGNOSIS — J9601 Acute respiratory failure with hypoxia: Secondary | ICD-10-CM | POA: Diagnosis present

## 2011-04-30 DIAGNOSIS — R918 Other nonspecific abnormal finding of lung field: Secondary | ICD-10-CM | POA: Diagnosis present

## 2011-04-30 DIAGNOSIS — J9600 Acute respiratory failure, unspecified whether with hypoxia or hypercapnia: Secondary | ICD-10-CM

## 2011-04-30 LAB — COMPREHENSIVE METABOLIC PANEL
ALT: 10 U/L (ref 0–53)
Albumin: 1.6 g/dL — ABNORMAL LOW (ref 3.5–5.2)
BUN: 36 mg/dL — ABNORMAL HIGH (ref 6–23)
Calcium: 8.1 mg/dL — ABNORMAL LOW (ref 8.4–10.5)
GFR calc non Af Amer: 60 mL/min — ABNORMAL LOW (ref >90)
Glucose, Bld: 153 mg/dL — ABNORMAL HIGH (ref 70–99)
Potassium: 4.5 mEq/L (ref 3.5–5.1)
Total Bilirubin: 0.1 mg/dL — ABNORMAL LOW (ref 0.3–1.2)
Total Protein: 6.5 g/dL (ref 6.0–8.3)

## 2011-04-30 LAB — CBC
HCT: 24.8 % — ABNORMAL LOW (ref 39.0–52.0)
Hemoglobin: 8.2 g/dL — ABNORMAL LOW (ref 13.0–17.0)
MCH: 30.1 pg (ref 26.0–34.0)
MCHC: 33.1 g/dL (ref 30.0–36.0)
RDW: 15.2 % (ref 11.5–15.5)

## 2011-04-30 LAB — BLOOD GAS, ARTERIAL
Acid-base deficit: 9.4 mmol/L — ABNORMAL HIGH (ref 0.0–2.0)
Bicarbonate: 14.9 mEq/L — ABNORMAL LOW (ref 20.0–24.0)
Drawn by: 347621
FIO2: 0.4 %
O2 Saturation: 90.1 %
O2 Saturation: 93.9 %
Patient temperature: 98.6
Patient temperature: 98.6
TCO2: 15.7 mmol/L (ref 0–100)
TCO2: 15.6 mmol/L (ref 0–100)
pO2, Arterial: 65.6 mmHg — ABNORMAL LOW (ref 80.0–100.0)

## 2011-04-30 LAB — RETICULOCYTES
RBC.: 2.87 MIL/uL — ABNORMAL LOW (ref 4.22–5.81)
Retic Ct Pct: 1.3 % (ref 0.4–3.1)

## 2011-04-30 LAB — FERRITIN: Ferritin: 1658 ng/mL — ABNORMAL HIGH (ref 22–322)

## 2011-04-30 LAB — IRON AND TIBC
Saturation Ratios: 31 % (ref 20–55)
TIBC: 127 ug/dL — ABNORMAL LOW (ref 215–435)
UIBC: 87 ug/dL — ABNORMAL LOW (ref 125–400)

## 2011-04-30 LAB — T-HELPER CELLS (CD4) COUNT (NOT AT ARMC)
CD4 % Helper T Cell: 3 % — ABNORMAL LOW (ref 33–55)
CD4 T Cell Abs: 20 uL — ABNORMAL LOW (ref 400–2700)

## 2011-04-30 MED ORDER — ALBUTEROL SULFATE (5 MG/ML) 0.5% IN NEBU: 2.5000 mg | INHALATION_SOLUTION | RESPIRATORY_TRACT | Status: DC

## 2011-04-30 MED ORDER — DEXTROSE 5 % IV SOLN
500.0000 mg | INTRAVENOUS | Status: DC
  Administered 2011-04-30 – 2011-05-01 (×2): 500 mg via INTRAVENOUS
  Filled 2011-04-30 (×3): qty 500

## 2011-04-30 MED ORDER — IPRATROPIUM BROMIDE 0.02 % IN SOLN: 0.5000 mg | Freq: Four times a day (QID) | RESPIRATORY_TRACT | Status: DC

## 2011-04-30 MED ORDER — ALBUTEROL SULFATE (5 MG/ML) 0.5% IN NEBU
2.5000 mg | INHALATION_SOLUTION | Freq: Four times a day (QID) | RESPIRATORY_TRACT | Status: DC
  Administered 2011-04-30 – 2011-05-02 (×10): 2.5 mg via RESPIRATORY_TRACT
  Filled 2011-04-30 (×10): qty 0.5

## 2011-04-30 MED ORDER — DEXTROSE 5 % IV SOLN
1.0000 g | Freq: Two times a day (BID) | INTRAVENOUS | Status: DC
  Administered 2011-04-30 – 2011-05-02 (×4): 1 g via INTRAVENOUS
  Filled 2011-04-30 (×5): qty 1

## 2011-04-30 MED ORDER — METHYLPREDNISOLONE SODIUM SUCC 125 MG IJ SOLR
125.0000 mg | Freq: Four times a day (QID) | INTRAMUSCULAR | Status: DC
  Administered 2011-04-30 – 2011-05-05 (×20): 125 mg via INTRAVENOUS
  Filled 2011-04-30 (×23): qty 2

## 2011-04-30 MED ORDER — IPRATROPIUM BROMIDE 0.02 % IN SOLN
0.5000 mg | Freq: Four times a day (QID) | RESPIRATORY_TRACT | Status: DC
  Administered 2011-04-30 – 2011-05-02 (×10): 0.5 mg via RESPIRATORY_TRACT
  Filled 2011-04-30 (×10): qty 2.5

## 2011-04-30 NOTE — Plan of Care (Signed)
Problem: Phase II Progression Outcomes Goal: Tolerating diet Decreased appetite, Nutritional consult/evalulation in progress.

## 2011-04-30 NOTE — Progress Notes (Signed)
Patient ID: Daniel House, male   DOB: [birth year], 36 y.o.   MRN: [id number]  PGY-1 Daily Progress Note Family Medicine Teaching Service [name], MD Service Pager: [phone]  Subjective:  Patient felt more uncomfortable overnight and had increased SOB. Given DuoNeb q4 with Albuterol prn. Patient had desats to mid-80's on 2L, increased O2. Patient states he still does not feel well this morning. States he does not have an appetite and does not feel hungry.   I have reviewed the patient's medications.  Objective Temp:  [96.4 F (35.8 C)-101.1 F (38.4 C)] 97.2 F (36.2 C) (04/10 0500) Pulse Rate:  [82-108] 82  (04/10 0500) Resp:  [16-17] 16  (04/10 0500) BP: (100-105)/(59-69) 105/59 mmHg (04/10 0500) SpO2:  [88 %-94 %] 90 % (04/10 0500) Weight:  [116 lb 10 oz (52.9 kg)] 116 lb 10 oz (52.9 kg) (04/10 0500)   Intake/Output Summary (Last 24 hours) at 04/30/11 0709 Last data filed at 04/30/11 0500  Gross per 24 hour  Intake    480 ml  Output   1100 ml  Net   -620 ml   General: awake, alert, appears uncomfortable. Wyatt in place HEENT: AT/[place], sclera white, MMM Lymph: no cervical, supraclavicular LAD CV: RRR, no murmurs Pulm: diffuse wheezes and crackles noted at bases Abd: firm abdomen but not tense (likely secondary to body habitus) +BS, NTND Ext: no edema, warm. Pulse ox on right finger Neuro: alert, oriented  Labs and Imaging  Lab 04/30/11 0500 04/29/11 0651 04/28/11 0635  WBC 7.4 10.6* 8.6  HGB 8.2* 8.5* 9.5*  HCT 24.8* 26.2* 29.3*  PLT 419* 401* 395    Lab 04/30/11 0500 04/29/11 0651 04/28/11 0635  NA 135 137 133*  K 4.5 4.4 4.6  CL 109 111 102  CO2 16* 17* 21  BUN 36* 38* 51*  CREATININE 1.47* 1.62* 1.62*  CALCIUM 8.1* 8.0* 8.5  PROT 6.5 -- 7.3  BILITOT 0.1* -- 0.2*  ALKPHOS 130* -- 114  ALT 10 -- 7  AST 57* -- 48*  GLUCOSE 153* 96 119*   TSH 0.792 ACE 38  Assessment and Plan 36 year old Male with no significant PMHx who presents with 1 week of  dyspnea, found to be tachycardic and hypoxic in ER, imagining concerning for interstitial lung disease:   1. Pulmonary - Imaging and clinical picture concerning for chronic lung disease.  Cannot rule out superimposed CAP.   DDx for interstitial lung disease pulmonary-renal syndromes, sarcoidosis, lupus. Patient is cachectic appearing, raising concern for infections such as TB and HIV (with PCP pneumonia), as well as for malignancy. Patient having increased O2 requirement.  -HIV positive. Awaiting confirmation Western Blot. -ID consulted. We appreciate their help with the care of this patient. Awaiting lab results which were ordered yesterday. -Patient did not improve greatly with Bactrim PO. Started on Bactrim IV 15 mg/kg of TMP divided q8 hours on 04/29/11. This is day #3 of antibiotics -Continue Prednisone 40mg  daily. -Continue O2 -Continue DuoNeb q4 with Albuterol q2prn -PPD negative. Nursing staff will call infection control about contact precautions. -f/u labs: ANCA, sputum cx -CXR done this morning, awaiting radiologist read -Continuous pulse ox in setting of dyspnea and increased O2 requirement.  2. Acute renal insufficiency- may be due to dehydration +/- NSAID use, but again concerning for pulmonary-renal cause of illness or HIVAN. UA significant for spec grav at 1.040, 100 of protein, and red cells on admission.  Creatinine is trending down after fluids. BUN improving. Overall labs  are improving. -continue to hydrate with IVFs -avoid NSAIDs -continue to monitor creatinine (s/p bolus and MIVF)  3. FEN/GI- patient appears malnourished, albumin low at 2.1. Will allow regular diet, give zofran prn, and protonix for GI prophylaxis. NS @ 125cc/hr. States nothing tastes good to him. -nutrition consult -Encourage patient to eat whatever he wants.   4. DVT ppx- SQ heparin   5. Disposition- pending clinical improvement and further work up by ID. Patient is stable at this time, but if his  respiratory status declines, will have low threshold to move to SDU.  [name], Madisynn Plair Pager: [phone] 04/30/2011, 7:09 AM

## 2011-04-30 NOTE — Progress Notes (Signed)
PPD test was negative. MD and day shift RN aware.

## 2011-04-30 NOTE — Progress Notes (Signed)
Evaluated patient at RN request due to increasing O2 requirement and tachypnea at 1800.  Pt notable short of breath.  Currently on Venti-mask at 50% O2 with sats in upper 90s.  Lungs: tachypneic, CTAB, no wheezes or rales  A/P: 36 year old M with new diagnosis of HIV/AIDs with presumed PCP pneumonia; high risk for decompensation. - will check stat ABG and CXR - consider CCM consult if labs concerning or clinical status worsens  BOOTH, Aoife Bold 04/30/2011, 8:26 PM

## 2011-04-30 NOTE — Progress Notes (Signed)
Pt continues c/o SOB, worse with exertion. Pt has course crackles on bases bilaterally, cough with minimal sputum.  O2 95% on 3L, RR 20, afebrile. Respiratory therapist called and asked to assess pt. MD also called and new orders received.

## 2011-04-30 NOTE — Progress Notes (Signed)
Spoke with ID about precautions. Pt to remain on airborne precautions until TB ruled out. CXR still questionable and also need sputum cultures.  Will continue to monitor. Duwaine Maxin, RN

## 2011-04-30 NOTE — Progress Notes (Signed)
Pt transferred from 3700 via bed.  Dyspneic and labored breathing, sats ranging from 90-68% on 6L [place], placed on Venti mask at 50%, tachypniec with rate in 30s.  Oriented to unit, pt verbalized understanding.  On TB precautions. Will page MD to notify of increased Oxygen needs.

## 2011-04-30 NOTE — Consult Note (Signed)
Name: Daniel House MRN: [id number] DOB: September 09, 1975    LOS: 3  [place] [facility]  History of Present Illness: 36 yo male with no significant PMH presented 4/8 with several week hx SOB, fevers/chills and productive cough.  Admitted 4/8 by Teaching Service and dx with HIV (new dx - CD4=20) and presumed PCP PNA.  Was being treated with bactrim, prednisone and BD.  On 4/10 developed increased SOB and hypoxia and PCCM consulted.    Lines and / Drains: none  Cultures/autoimmune: BCx2 4/8>>> Sputum 4/8>>> Cryptococcal Ag 4/10>>>NEG ANA 4/8>>> NEG  Antibiotics: Azithro 4/10>>> Cefepime 4/10>>> Bactrim 4/10>>>  Tests / Events:    History reviewed. No pertinent past medical history. Past Surgical History  Procedure Date  . Appendectomy    Prior to Admission medications   Medication Sig Start Date End Date Taking? Authorizing Provider  albuterol (PROVENTIL HFA;VENTOLIN HFA) 108 (90 BASE) MCG/ACT inhaler Inhale 2 puffs into the lungs every 4 (four) hours as needed. For shortness of breath    Yes Historical Provider, MD  naproxen sodium (ANAPROX) 220 MG tablet Take 220 mg by mouth daily as needed. For pain   Yes Historical Provider, MD   Allergies No Known Allergies  Family History No family history on file.  Social History  reports that he quit smoking 9 days ago. He does not have any smokeless tobacco history on file. He reports that he drinks alcohol. He reports that he does not use illicit drugs.  Review Of Systems   C/o SOB, non productive cough. Denies chest pain, hemoptysis. All other systems reviewed and were neg.    Vital Signs: Temp:  [97 F (36.1 C)-97.5 F (36.4 C)] 97.5 F (36.4 C) (04/10 1357) Pulse Rate:  [82-90] 82  (04/10 1357) Resp:  [16-24] 24  (04/10 1357) BP: (102-106)/(59-73) 106/73 mmHg (04/10 1357) SpO2:  [73 %-98 %] 93 % (04/10 1357) Weight:  [116 lb 10 oz (52.9 kg)] 116 lb 10 oz (52.9 kg) (04/10 0500) I/O last 3 completed shifts: In: 480  [P.O.:480] Out: 1250 [Urine:1250]  Physical Examination:  General: frail, thin, anxious male Neuro: awake and alert, MAe CV: s1s2 rrr PULM: mod increased WOB with any activity, +accessory muscle use, diffuse crackles posteriorly, +tactile fremitus GI: soft, nontender, non osm Extremities:  No edema    Labs and Imaging:   CBC    Component Value Date/Time   WBC 7.4 04/30/2011 0500   RBC 2.72* 04/30/2011 0500   HGB 8.2* 04/30/2011 0500   HCT 24.8* 04/30/2011 0500   PLT 419* 04/30/2011 0500   MCV 91.2 04/30/2011 0500   MCH 30.1 04/30/2011 0500   MCHC 33.1 04/30/2011 0500   RDW 15.2 04/30/2011 0500   LYMPHSABS 1.9 02/02/2010 1259   MONOABS 0.5 02/02/2010 1259   EOSABS 0.0 02/02/2010 1259   BASOSABS 0.0 02/02/2010 1259    BMET    Component Value Date/Time   NA 135 04/30/2011 0500   K 4.5 04/30/2011 0500   CL 109 04/30/2011 0500   CO2 16* 04/30/2011 0500   GLUCOSE 153* 04/30/2011 0500   BUN 36* 04/30/2011 0500   CREATININE 1.47* 04/30/2011 0500   CALCIUM 8.1* 04/30/2011 0500   GFRNONAA 60* 04/30/2011 0500   GFRAA 69* 04/30/2011 0500      ABG    Component Value Date/Time   PHART 7.428 04/27/2011 2035   PCO2ART 33.3* 04/27/2011 2035   PO2ART 85.0 04/27/2011 2035   HCO3 23.3 04/27/2011 2108   TCO2 25 04/27/2011 2108  ACIDBASEDEF 2.0 04/27/2011 2108   O2SAT 52.0 04/27/2011 2108    Dg Chest 2 View  04/30/2011  *RADIOLOGY REPORT*  Clinical Data: Shortness of breath, tachycardia.  CHEST - 2 VIEW  Comparison: 04/27/2011  Findings: Worsening interstitial and alveolar opacities throughout the lungs.  No effusions.  Heart is borderline in size.  No acute bony abnormality.  IMPRESSION: Worsening bilateral interstitial and alveolar opacities.  This could represent edema or infection, possibly atypical infection.  Original Report Authenticated By: Cyndie Chime, M.D.      Assessment and Plan:  1. Acute resp failure -- r/t presumed PCP PNA in pt with newly diagnosed HIV/AIDS (CD4=20). Cryptococcal Ag  negative. No sputum has been collected as of yet for culture, AFB or pneumocystis. Other autoimmune w/u negative thus far.  PLAN -  Cont abx - per ID  Close monitoring of resp status - dont think there is anything to add at this time O2  pulm hygiene Tx SDU F/u CXR in am  Will f/u   [facility], NP 04/30/2011  3:08 PM Pager: [phone]  *Care during the described time interval was provided by me and/or other providers on the critical care team. I have reviewed this patient's available data, including medical history, events of note, physical examination and test results as part of my evaluation.   Pt seen and examined and database reviewed. I agree with above findings, assessment and plan. Seen on CCM rounds this morning with resident MD. Our discussed plan is reflected in the note above. Radiographic and clinical picture are highly suggestive of Pneumocystis. A bronchoscopy to confirm this would likely present for risk than benefit at this point. His deterioration since admission (mostly reflected in increasing infiltrates and oxygen requirements) justify a move to the SDU. We will continue to follow him until it is clear that he is on a path of recovery. If he is intubated, an FOB will be performed or if it is deemed necessary by ID service  Billy Fischer, MD;  PCCM service; Mobile [phone]

## 2011-04-30 NOTE — Progress Notes (Addendum)
Called to bedside by RN, patient with increasing oxygen requirement and respiratory distress.  Per RN, patient was sating in the 70's when she walked in the room, patient was on 4 L [place].  She turned it up to 5, he improved to low 90's initially.    During our exam, RR= 25, pulse ox 82-90% on 5L [place], most recent BP is 106/73 Resp: Diffuse crackles, unchanged from exam this am. Slight respiratory distress.  Neck: No jvd LE: No edema.   New lab results:  CD4 count: 33  A/P 36 year old male with Presumed PCP PNA, New dx of HIV, with worsening Respiratory status:  - Transfer to Step down unit due to increased oxygen requirement, ordered face mask for oxygen therapy - patient on bactrim and prednisone for PCP.  Cryptococcal antigen negative, Histo pending.  As of yet, no sputum culture to eval for AFB or Pneumocystsis has been able to be collected.  - Patient was extremely dehydrated on presentation, is about 3.5 L + since admission.  However, I do not see signs of fluid overload on exam right now, and feel this is due to worsening of PNA in and extremely immunocompromised patient. Patient has borderline pressures, is not eating or drinking, will continue IVF at this time.  - I have paged Dr. Daiva Eves of ID (already consulting) to notify him of patients status and ask if he has recommendations for further management.   Luisfelipe Engelstad 04/30/2011 2:38 PM  Spoke with Dr. Daiva Eves, who recommended Critical care consult as patient may decline quickly and require intubation. Dr. Daiva Eves feels this is consistent with PCP.  Will also order ABG, and add Azithromycin for atypical coverage and cefepime for gram negative coverage.

## 2011-04-30 NOTE — Progress Notes (Signed)
Subjective: Pt with worsned respiratory status and CXR, CD4 is 20, LDH >700   Antibiotics:  Anti-infectives     Start     Dose/Rate Route Frequency Ordered Stop   04/30/11 1600   azithromycin (ZITHROMAX) 500 mg in dextrose 5 % 250 mL IVPB        500 mg 250 mL/hr over 60 Minutes Intravenous Every 24 hours 04/30/11 1455     04/30/11 1600   ceFEPIme (MAXIPIME) 1 g in dextrose 5 % 50 mL IVPB        1 g 100 mL/hr over 30 Minutes Intravenous Every 12 hours 04/30/11 1455     04/29/11 1100  sulfamethoxazole-trimethoprim (BACTRIM) 255 mg in dextrose 5 % 250 mL IVPB       255 mg 265.9 mL/hr over 60 Minutes Intravenous Every 8 hours 04/29/11 1008     04/28/11 1600   sulfamethoxazole-trimethoprim (BACTRIM DS) 800-160 MG per tablet 2 tablet  Status:  Discontinued        2 tablet Oral 3 times daily 04/28/11 1348 04/29/11 1008   04/28/11 1330   sulfamethoxazole-trimethoprim (BACTRIM,SEPTRA) 400-80 MG per tablet 2.5 tablet  Status:  Discontinued        2.5 tablet Oral 4 times per day 04/28/11 1224 04/28/11 1348   04/28/11 1100   sulfamethoxazole-trimethoprim (BACTRIM DS) 800-160 MG per tablet 1 tablet  Status:  Discontinued        1 tablet Oral Daily 04/28/11 1046 04/28/11 1224   04/27/11 2345   cefTRIAXone (ROCEPHIN) 1 g in dextrose 5 % 50 mL IVPB        1 g 100 mL/hr over 30 Minutes Intravenous  Once 04/27/11 2338 04/28/11 0028   04/27/11 2345   azithromycin (ZITHROMAX) 500 mg in dextrose 5 % 250 mL IVPB        500 mg 250 mL/hr over 60 Minutes Intravenous  Once 04/27/11 2338 04/28/11 0128          Medications: Scheduled Meds:   . albuterol  2.5 mg Nebulization QID   And  . ipratropium  0.5 mg Nebulization QID  . antiseptic oral rinse  15 mL Mouth Rinse BID  . azithromycin  500 mg Intravenous Q24H  . ceFEPime (MAXIPIME) IV  1 g Intravenous Q12H  . feeding supplement  237 mL Oral BID BM  . heparin  5,000 Units Subcutaneous Q8H  . methylPREDNISolone (SOLU-MEDROL) injection  125 mg  Intravenous Q6H  . sodium chloride  3 mL Intravenous Q12H  . sulfamethoxazole-trimethoprim  255 mg Intravenous Q8H  . DISCONTD: albuterol  2.5 mg Nebulization Q4H  . DISCONTD: albuterol  2.5 mg Nebulization Q4H  . DISCONTD: ipratropium  0.5 mg Nebulization Q4H  . DISCONTD: ipratropium  0.5 mg Nebulization QID  . DISCONTD: predniSONE  40 mg Oral BID WC   Continuous Infusions:   . sodium chloride 125 mL/hr at 04/30/11 1029   PRN Meds:.acetaminophen, acetaminophen, albuterol, albuterol, alum & mag hydroxide-simeth, benzonatate, docusate sodium, ondansetron (ZOFRAN) IV, ondansetron, oxyCODONE, zolpidem   Objective: Weight change: 4 lb 6.8 oz (2.006 kg)  Intake/Output Summary (Last 24 hours) at 04/30/11 1921 Last data filed at 04/30/11 1800  Gross per 24 hour  Intake    125 ml  Output   1450 ml  Net  -1325 ml   Blood pressure 109/73, pulse 84, temperature 96.8 F (36 C), temperature source Axillary, resp. rate 27, height 6' (1.829 m), weight 114 lb 3.2 oz (51.8 kg), SpO2 100.00%. Temp:  [96.8 F (  36 C)-97.5 F (36.4 C)] 96.8 F (36 C) (04/10 1800) Pulse Rate:  [82-96] 84  (04/10 1900) Resp:  [16-37] 27  (04/10 1900) BP: (102-124)/(59-75) 109/73 mmHg (04/10 1900) SpO2:  [73 %-100 %] 100 % (04/10 1900) FiO2 (%):  [50 %] 50 % (04/10 1900) Weight:  [114 lb 3.2 oz (51.8 kg)-116 lb 10 oz (52.9 kg)] 114 lb 3.2 oz (51.8 kg) (04/10 1800)  Physical Exam: General: Alert and awake, oriented x3, appears anxious. HEENT: anicteric sclera, pupils reactive to light and accommodation, EOMI CVS tachycardic rate, normal r,  no murmur rubs or gallops Chest: wheezes and rhonchi esp posteriorly Abdomen: soft nontender, nondistended, normal bowel sounds, Extremities: no  clubbing or edema noted bilaterally Skin: no rashes  Neuro: nonfocal  Lab Results:  Basename 04/30/11 0500 04/29/11 0651  WBC 7.4 10.6*  HGB 8.2* 8.5*  HCT 24.8* 26.2*  PLT 419* 401*    BMET  Basename 04/30/11 0500  04/29/11 0651  NA 135 137  K 4.5 4.4  CL 109 111  CO2 16* 17*  GLUCOSE 153* 96  BUN 36* 38*  CREATININE 1.47* 1.62*  CALCIUM 8.1* 8.0*    Micro Results: Recent Results (from the past 240 hour(s))  CULTURE, BLOOD (ROUTINE X 2)     Status: Normal (Preliminary result)   Collection Time   04/28/11 12:30 AM      Component Value Range Status Comment   Specimen Description BLOOD RIGHT ARM   Final    Special Requests BOTTLES DRAWN AEROBIC ONLY 5CC   Final    Culture  Setup Time [id number]   Final    Culture     Final    Value:        BLOOD CULTURE RECEIVED NO GROWTH TO DATE CULTURE WILL BE HELD FOR 5 DAYS BEFORE ISSUING A FINAL NEGATIVE REPORT   Report Status PENDING   Incomplete   CULTURE, BLOOD (ROUTINE X 2)     Status: Normal (Preliminary result)   Collection Time   04/28/11 12:40 AM      Component Value Range Status Comment   Specimen Description BLOOD RIGHT WRIST   Final    Special Requests BOTTLES DRAWN AEROBIC ONLY 2CC   Final    Culture  Setup Time [id number]   Final    Culture     Final    Value:        BLOOD CULTURE RECEIVED NO GROWTH TO DATE CULTURE WILL BE HELD FOR 5 DAYS BEFORE ISSUING A FINAL NEGATIVE REPORT   Report Status PENDING   Incomplete   MRSA PCR SCREENING     Status: Normal   Collection Time   04/28/11  4:14 AM      Component Value Range Status Comment   MRSA by PCR NEGATIVE  NEGATIVE  Final     Studies/Results: Dg Chest 2 View  04/30/2011  *RADIOLOGY REPORT*  Clinical Data: Shortness of breath, tachycardia.  CHEST - 2 VIEW  Comparison: 04/27/2011  Findings: Worsening interstitial and alveolar opacities throughout the lungs.  No effusions.  Heart is borderline in size.  No acute bony abnormality.  IMPRESSION: Worsening bilateral interstitial and alveolar opacities.  This could represent edema or infection, possibly atypical infection.  Original Report Authenticated By: Cyndie Chime, M.D.      Assessment/Plan: Daniel House is a 36 y.o. male with  Newly  diagnosed HIV/AIDS, and PCP pneumonia:   1) PCP Pneumonia: Primary team has (as of time of this note added back  cefepime and azthromycin for HCAP and atypical coverage). I still do not think this is bacterial infection but rather just very bad case of PCP in pt with poor pulmoary reserve at baseline perhaps due to COPD. I AM VERY concerned about his respiratory status and I think he will need very close monitoring and very likely will require ETT. I discussed this case with the house-staff who alerted me to his condition this afternoon and with Dr. Sung Amabile with CCM with whom I also reviewed the case and the films.  --agree with change to IV steroids and continued high dose TMP/SMX --reasonable to broaden for bacterial pathogens with cefepime and azithromycin (though I dont think they are playing a role) --TB also not terribly likely,  --if pt gets ETT would have bronch specimens sent for PCP smear,quantitative BAL bacterial cultures and AFB cultures  2) HIV/AIDS: VLoad pending with genotype. Would like to see what his creatinine does. If it normalizes would like to go to prezista norvir and truvada. Otherwise may have to go to abacavir based regimen (after hla 5701 is negative) or combivir or less conventional regimen I spoke with our "Bridge Counselor" Sharol Roussel about this pt and she will visit pt and try to expedite Aids DRug Assistance Program (ADAP). This may not kick in until 2 weeks. IN interim should pt dc I think it would be worth $ to have him started on ARV in house with samples to bridge him to ADAP covered meds. This pt will be at VERY high risk for readmission and studies have shown that prompt ARV initiation in pts with OIs lead to reduced mortality and morbidity  I spent greater than 45 minutes with the patient including greater than 50% of time in face to face counsel of the patient and in coordination of their care.    LOS: 3 days   Acey Lav 04/30/2011, 7:21 PM

## 2011-04-30 NOTE — Progress Notes (Signed)
DR. Mikel Cella notified of resp status on arrival to 2600, came to see pt.  Order for ABG and CXR received.  Will let night shift RN Inetta Fermo know to watch for results.

## 2011-05-01 LAB — CBC
HCT: 22.9 % — ABNORMAL LOW (ref 39.0–52.0)
HCT: 23.1 % — ABNORMAL LOW (ref 39.0–52.0)
Hemoglobin: 7.5 g/dL — ABNORMAL LOW (ref 13.0–17.0)
MCH: 29.1 pg (ref 26.0–34.0)
MCH: 29.7 pg (ref 26.0–34.0)
MCHC: 32.8 g/dL (ref 30.0–36.0)
MCHC: 33.3 g/dL (ref 30.0–36.0)
MCV: 89.2 fL (ref 78.0–100.0)
Platelets: 426 10*3/uL — ABNORMAL HIGH (ref 150–400)
RBC: 2.58 MIL/uL — ABNORMAL LOW (ref 4.22–5.81)
RDW: 15.1 % (ref 11.5–15.5)

## 2011-05-01 LAB — COMPREHENSIVE METABOLIC PANEL
ALT: 9 U/L (ref 0–53)
Alkaline Phosphatase: 120 U/L — ABNORMAL HIGH (ref 39–117)
BUN: 29 mg/dL — ABNORMAL HIGH (ref 6–23)
CO2: 16 mEq/L — ABNORMAL LOW (ref 19–32)
GFR calc Af Amer: 84 mL/min — ABNORMAL LOW (ref >90)
GFR calc non Af Amer: 73 mL/min — ABNORMAL LOW (ref >90)
Glucose, Bld: 124 mg/dL — ABNORMAL HIGH (ref 70–99)
Potassium: 4.7 mEq/L (ref 3.5–5.1)
Sodium: 133 mEq/L — ABNORMAL LOW (ref 135–145)
Total Protein: 6.1 g/dL (ref 6.0–8.3)

## 2011-05-01 LAB — HIV-1 RNA ULTRAQUANT REFLEX TO GENTYP+
HIV 1 RNA Quant: 837756 copies/mL — ABNORMAL HIGH (ref <20)
HIV-1 RNA Quant, Log: 5.92 {Log} — ABNORMAL HIGH (ref <1.30)

## 2011-05-01 MED ORDER — FERROUS SULFATE 325 (65 FE) MG PO TABS
325.0000 mg | ORAL_TABLET | Freq: Three times a day (TID) | ORAL | Status: DC
  Administered 2011-05-01 – 2011-05-15 (×44): 325 mg via ORAL
  Filled 2011-05-01 (×47): qty 1

## 2011-05-01 MED ORDER — ENSURE PUDDING PO PUDG
1.0000 | Freq: Three times a day (TID) | ORAL | Status: DC
  Administered 2011-05-01 – 2011-05-02 (×3): 1 via ORAL

## 2011-05-01 MED ORDER — SULFAMETHOXAZOLE-TRIMETHOPRIM 400-80 MG/5ML IV SOLN
255.0000 mg | Freq: Three times a day (TID) | INTRAVENOUS | Status: DC
  Administered 2011-05-01 – 2011-05-05 (×11): 255 mg via INTRAVENOUS
  Filled 2011-05-01 (×23): qty 15.9

## 2011-05-01 MED ORDER — DOCUSATE SODIUM 100 MG PO CAPS
100.0000 mg | ORAL_CAPSULE | Freq: Two times a day (BID) | ORAL | Status: DC
  Administered 2011-05-01 – 2011-05-15 (×27): 100 mg via ORAL
  Filled 2011-05-01 (×30): qty 1

## 2011-05-01 MED ORDER — MIRTAZAPINE 15 MG PO TABS
15.0000 mg | ORAL_TABLET | Freq: Every day | ORAL | Status: DC
  Administered 2011-05-01 – 2011-05-14 (×14): 15 mg via ORAL
  Filled 2011-05-01 (×16): qty 1

## 2011-05-01 NOTE — Progress Notes (Signed)
Subjective: Pt breathing better today   Antibiotics:  Anti-infectives     Start     Dose/Rate Route Frequency Ordered Stop   04/30/11 1600   azithromycin (ZITHROMAX) 500 mg in dextrose 5 % 250 mL IVPB        500 mg 250 mL/hr over 60 Minutes Intravenous Every 24 hours 04/30/11 1455     04/30/11 1600   ceFEPIme (MAXIPIME) 1 g in dextrose 5 % 50 mL IVPB        1 g 100 mL/hr over 30 Minutes Intravenous Every 12 hours 04/30/11 1455     04/29/11 1100  sulfamethoxazole-trimethoprim (BACTRIM) 255 mg in dextrose 5 % 250 mL IVPB       255 mg 265.9 mL/hr over 60 Minutes Intravenous Every 8 hours 04/29/11 1008     04/28/11 1600   sulfamethoxazole-trimethoprim (BACTRIM DS) 800-160 MG per tablet 2 tablet  Status:  Discontinued        2 tablet Oral 3 times daily 04/28/11 1348 04/29/11 1008   04/28/11 1330   sulfamethoxazole-trimethoprim (BACTRIM,SEPTRA) 400-80 MG per tablet 2.5 tablet  Status:  Discontinued        2.5 tablet Oral 4 times per day 04/28/11 1224 04/28/11 1348   04/28/11 1100   sulfamethoxazole-trimethoprim (BACTRIM DS) 800-160 MG per tablet 1 tablet  Status:  Discontinued        1 tablet Oral Daily 04/28/11 1046 04/28/11 1224   04/27/11 2345   cefTRIAXone (ROCEPHIN) 1 g in dextrose 5 % 50 mL IVPB        1 g 100 mL/hr over 30 Minutes Intravenous  Once 04/27/11 2338 04/28/11 0028   04/27/11 2345   azithromycin (ZITHROMAX) 500 mg in dextrose 5 % 250 mL IVPB        500 mg 250 mL/hr over 60 Minutes Intravenous  Once 04/27/11 2338 04/28/11 0128          Medications: Scheduled Meds:    . albuterol  2.5 mg Nebulization QID   And  . ipratropium  0.5 mg Nebulization QID  . antiseptic oral rinse  15 mL Mouth Rinse BID  . azithromycin  500 mg Intravenous Q24H  . ceFEPime (MAXIPIME) IV  1 g Intravenous Q12H  . docusate sodium  100 mg Oral BID  . feeding supplement  1 Container Oral TID BM  . ferrous sulfate  325 mg Oral TID WC  . heparin  5,000 Units Subcutaneous Q8H  .  methylPREDNISolone (SOLU-MEDROL) injection  125 mg Intravenous Q6H  . mirtazapine  15 mg Oral QHS  . sodium chloride  3 mL Intravenous Q12H  . sulfamethoxazole-trimethoprim  255 mg Intravenous Q8H  . DISCONTD: feeding supplement  237 mL Oral BID BM  . DISCONTD: predniSONE  40 mg Oral BID WC   Continuous Infusions:    . sodium chloride 75 mL/hr at 05/01/11 1139   PRN Meds:.acetaminophen, acetaminophen, albuterol, albuterol, alum & mag hydroxide-simeth, benzonatate, ondansetron (ZOFRAN) IV, ondansetron, oxyCODONE, zolpidem, DISCONTD: docusate sodium   Objective: Weight change: -2 lb 6.8 oz (-1.1 kg)  Intake/Output Summary (Last 24 hours) at 05/01/11 1322 Last data filed at 05/01/11 1206  Gross per 24 hour  Intake   3319 ml  Output   1150 ml  Net   2169 ml   Blood pressure 113/71, pulse 90, temperature 98.3 F (36.8 C), temperature source Oral, resp. rate 25, height 6' (1.829 m), weight 114 lb 3.2 oz (51.8 kg), SpO2 95.00%. Temp:  [96.4 F (35.8 C)-98.3 F (  36.8 C)] 98.3 F (36.8 C) (04/11 1200) Pulse Rate:  [80-96] 90  (04/11 1200) Resp:  [24-37] 25  (04/11 1200) BP: (103-124)/(60-75) 113/71 mmHg (04/11 1200) SpO2:  [73 %-100 %] 95 % (04/11 1214) FiO2 (%):  [40 %-50 %] 40 % (04/11 0749) Weight:  [114 lb 3.2 oz (51.8 kg)] 114 lb 3.2 oz (51.8 kg) (04/10 1800)  Physical Exam: General: Alert and awake, oriented x3, a HEENT: anicteric sclera, pupils reactive to light and accommodation, EOMI CVS, normal r,  no murmur rubs or gallops Chest: wheezes and rhonchi esp posteriorly Abdomen: soft nontender, nondistended, normal bowel sounds, Extremities: no  clubbing or edema noted bilaterally Skin: no rashes  Neuro: nonfocal  Lab Results:  Basename 05/01/11 1235 05/01/11 0430  WBC 6.0 6.2  HGB 7.7* 7.5*  HCT 23.1* 22.9*  PLT 426* 419*    BMET  Basename 05/01/11 0430 04/30/11 0500  NA 133* 135  K 4.7 4.5  CL 109 109  CO2 16* 16*  GLUCOSE 124* 153*  BUN 29* 36*    CREATININE 1.25 1.47*  CALCIUM 8.1* 8.1*    Micro Results: Recent Results (from the past 240 hour(s))  CULTURE, BLOOD (ROUTINE X 2)     Status: Normal (Preliminary result)   Collection Time   04/28/11 12:30 AM      Component Value Range Status Comment   Specimen Description BLOOD RIGHT ARM   Final    Special Requests BOTTLES DRAWN AEROBIC ONLY 5CC   Final    Culture  Setup Time [id number]   Final    Culture     Final    Value:        BLOOD CULTURE RECEIVED NO GROWTH TO DATE CULTURE WILL BE HELD FOR 5 DAYS BEFORE ISSUING A FINAL NEGATIVE REPORT   Report Status PENDING   Incomplete   CULTURE, BLOOD (ROUTINE X 2)     Status: Normal (Preliminary result)   Collection Time   04/28/11 12:40 AM      Component Value Range Status Comment   Specimen Description BLOOD RIGHT WRIST   Final    Special Requests BOTTLES DRAWN AEROBIC ONLY 2CC   Final    Culture  Setup Time [id number]   Final    Culture     Final    Value:        BLOOD CULTURE RECEIVED NO GROWTH TO DATE CULTURE WILL BE HELD FOR 5 DAYS BEFORE ISSUING A FINAL NEGATIVE REPORT   Report Status PENDING   Incomplete   MRSA PCR SCREENING     Status: Normal   Collection Time   04/28/11  4:14 AM      Component Value Range Status Comment   MRSA by PCR NEGATIVE  NEGATIVE  Final     Studies/Results: Dg Chest 2 View  04/30/2011  *RADIOLOGY REPORT*  Clinical Data: Shortness of breath, tachycardia.  CHEST - 2 VIEW  Comparison: 04/27/2011  Findings: Worsening interstitial and alveolar opacities throughout the lungs.  No effusions.  Heart is borderline in size.  No acute bony abnormality.  IMPRESSION: Worsening bilateral interstitial and alveolar opacities.  This could represent edema or infection, possibly atypical infection.  Original Report Authenticated By: Cyndie Chime, M.D.   Dg Chest Port 1 View  04/30/2011  *RADIOLOGY REPORT*  Clinical Data: Increasing hypoxia.  The T tip anemia.  PORTABLE CHEST - 1 VIEW  Comparison: Two-view chest  04/30/2011.  Findings: Progressive left interstitial airspace disease is present.  Heart size is  normal.  Interstitial and airspace disease on the right is stable.  The visualized soft tissues and bony thorax are unremarkable.  IMPRESSION:  1.  Progressive interstitial airspace disease on the left.  This is concerning for an atypical infection.  Asymmetric edema could have this appearance. 2.  Similar appearance of right-sided disease.  Original Report Authenticated By: Jamesetta Orleans. MATTERN, M.D.      Assessment/Plan: Daniel House is a 36 y.o. male with  Newly diagnosed HIV/AIDS, and PCP pneumonia:   1) PCP Pneumonia: Primary team has (as of time of this note added back cefepime and azthromycin for HCAP and atypical coverage). I still do not think this is bacterial infection but rather just very bad case of PCP in pt with poor pulmoary reserve at baseline perhaps due to COPD. I was concerned yesterday about his respiratory status and I am glad he is being watched very closely --continue high dose IV steroids, IV bactrim for PCP --reasonble to continue HCAP coverage with cefepime, azithro and bactrim --check legionella ag --give flu and pneumovax vaccines if not done so far --TB also not terribly likely,  --if pt gets ETT would have bronch specimens sent for PCP smear,quantitative BAL bacterial cultures and AFB cultures --wouldn't try to induce him right now  2) HIV/AIDS: VLoad pending with genotype. Would like to see what his creatinine does. If it normalizes would like to go to prezista norvir and truvada. Otherwise may have to go to abacavir based regimen (after hla 5701 is negative) or combivir or less conventional regimen I spoke with our "Bridge Counselor" Sharol Roussel about this pt and she will visit pt and try to expedite Aids DRug Assistance Program (ADAP). This may not kick in until 2 weeks. IN interim should pt dc I think it would be worth $ to have him started on ARV in house with samples  to bridge him to ADAP covered meds. This pt will be at VERY high risk for readmission and studies have shown that prompt ARV initiation in pts with OIs lead to reduced mortality and morbidity     LOS: 4 days   Acey Lav 05/01/2011, 1:22 PM

## 2011-05-01 NOTE — Progress Notes (Signed)
Clinical Social Worker (CSW) received referral, has met with pt and full psychosocial assessment to follow .  Daniel House, MSW, LCSWA  [phone]   

## 2011-05-01 NOTE — Progress Notes (Signed)
FMTS Attending Note  Patient seen and examined by me today; discussed with Dr Mikel Cella and agree with her assessment/plan as per her note.  Patient appears to be breathing more comfortably today, on nasal [place] oxygen, RR 16 at my exam (9am). Plan to start mirtazepine and increased caloric intake; may need to consider other means of providing nutritional support (?TFs) if patient unable to increase oral intake.  Paula Compton, MD

## 2011-05-01 NOTE — Progress Notes (Signed)
Patient ID: Daniel House, male   DOB: [birth year], 36 y.o.   MRN: [id number]  PGY-1 Daily Progress Note Family Medicine Teaching Service Daniel Buskirk M. Chaniyah Jahr, MD Service Pager: [phone]  Subjective:  Patient had desats overnight to 73%, requiring venturi mask. Patient was moved to SDU yesterday due to decline in respiratory status. He has remained afebrile, although he had some low temperatures recorded. Has not had anything to eat. Also, patient states his last bowel movement was a few days ago.  I have reviewed the patient's medications.  Objective Temp:  [96.4 F (35.8 C)-97.5 F (36.4 C)] 96.4 F (35.8 C) (04/11 0000) Pulse Rate:  [80-96] 88  (04/11 0629) Resp:  [24-37] 29  (04/11 0629) BP: (103-124)/(60-75) 113/72 mmHg (04/11 0500) SpO2:  [73 %-100 %] 95 % (04/11 0629) FiO2 (%):  [40 %-50 %] 40 % (04/11 0629) Weight:  [114 lb 3.2 oz (51.8 kg)] 114 lb 3.2 oz (51.8 kg) (04/10 1800)   Intake/Output Summary (Last 24 hours) at 05/01/11 0657 Last data filed at 05/01/11 0558  Gross per 24 hour  Intake   2071 ml  Output   1550 ml  Net    521 ml   General: awake, alert, appears uncomfortable. Section in place HEENT: AT/[place], sclera white, MMM Lymph: no cervical or supraclavicular LAD CV: RRR, no murmurs Pulm: diffuse wheezes and crackles noted at bases Abd: firm abdomen but not tense (likely secondary to body habitus) +BS, NTND Ext: no edema, warm. Pulse ox on right finger Neuro: alert, oriented  Labs and Imaging  Lab 05/01/11 0430 04/30/11 0500 04/29/11 0651  WBC 6.2 7.4 10.6*  HGB 7.5* 8.2* 8.5*  HCT 22.9* 24.8* 26.2*  PLT 419* 419* 401*    Lab 05/01/11 0430 04/30/11 0500 04/29/11 0651 04/28/11 0635  NA 133* 135 137 --  K 4.7 4.5 4.4 --  CL 109 109 111 --  CO2 16* 16* 17* --  BUN 29* 36* 38* --  CREATININE 1.25 1.47* 1.62* --  CALCIUM 8.1* 8.1* 8.0* --  PROT 6.1 6.5 -- 7.3  BILITOT <0.1* 0.1* -- 0.2*  ALKPHOS 120* 130* -- 114  ALT 9 10 -- 7  AST 52* 57* -- 48*  GLUCOSE  124* 153* 96 --   TSH 0.792 ACE 38 CD4 20 HIV Quant 837,756 LDH 715 Crytpo Neg  Assessment and Plan 36 year old Male with no significant PMHx who presents with 1 week of dyspnea, found to be tachycardic and hypoxic in ER, imagining concerning for interstitial lung disease:   1. Pulmonary - Imaging and clinical picture concerning for PCP pneumonia, or atypical infection.  Cannot rule out superimposed CAP.  Patient having increased O2 requirement. Transferred to SDU on 04/30/11 due to worsening respiratory status. Repeat CXR shows worsening opacities. -HIV positive. Awaiting confirmation Western Blot. CD4 count 20. HIV viral load 837K. -ID consulted. We appreciate their help with the care of this patient. Awaiting lab results -Patient did not improve greatly with Bactrim PO. Started on Bactrim IV 15 mg/kg of TMP divided q8 hours on 04/29/11. This is day #4 of antibiotics -Switched from prednisone to Solumedrol yesterday -Continue O2 via [place], but use mask as needed to maintain sats. CCM was consulted yesterday so they are aware of patient. He could quickly decompensate, at which time CCM should be called. -Continue DuoNeb q4 with Albuterol q2prn -PPD negative, but concern if patient can mount adequate response given immunosuppression. It should remain on our differential until we get sputum samples. -We  truly appreciate ID's consult and help with caring for this patient. -Discussed goals of care with patient. He states his family members do not know his full diagnosis. His point of contact is his sister Daniel House. He does not have her phone number, but he will have her leave it in the room today. I discussed with him the chance he may need to be intubated and we would need someone to make decisions about his care. This person should know his HIV status. Patient was clearly upset over this, but I did encourage him to tell Daniel House if she is the person he wants [place] to talk to. Patient appears stable  at this time, but we will continue to monitor closely.  2. Acute renal insufficiency- may be due to dehydration +/- NSAID use, but again concerning for pulmonary-renal cause of illness or HIVAN. UA significant for spec grav at 1.040, 100 of protein, and red cells on admission.  Creatinine is trending down after fluids. BUN improving. Overall labs are improving. -continue to hydrate with IVFs -avoid NSAIDs -continue to monitor creatinine (s/p bolus and MIVF)  3. Anemia- HgB 7.5 this morning, down 3 points from admission. Initial drop in HgB contributed to hemodilution. Iron panel sent yesterday. No signs of active bleeding. Anemia of chronic disease a possibility, but unsure if that would cause a daily drop in HgB. - Discussed his HgB with patient this morning. States he is not having frequent stools. Last BM was 2-3 days ago and was not dark or bloody. - Repeat CBC at noon today to trend hemoglobin. - Continue to monitor. - Will discuss transfusion threshold with team.  4. FEN/GI- patient appears malnourished, albumin low at 2.1. Will allow regular diet, give zofran prn, and protonix for GI prophylaxis. NS decrease to 75 cc/hr. States nothing tastes good to him. - Nutrition consult -Encourage patient to eat whatever he wants, which is soup at this time. Will change Ensure to pudding form. Also, start Remeron.  5. DVT ppx- SQ heparin   6. Disposition- Pending clinical improvement and further work up by ID. Patient is stable at this time, but if his respiratory status declines, will have low threshold to intubate and call CCM.   Daniel House, Daniel House Pager: [phone] 05/01/2011, 6:57 AM

## 2011-05-01 NOTE — Progress Notes (Signed)
Name: Daniel House MRN: [id number] DOB: 04/28/1975    LOS: 4  [place] [facility]  History of Present Illness: 36 yo male with no significant PMH presented 4/8 with several week hx SOB, fevers/chills and productive cough.  Admitted 4/8 by Teaching Service and dx with HIV (new dx - CD4=20) and presumed PCP PNA.  Was being treated with bactrim, prednisone and BD.  On 4/10 developed increased SOB and hypoxia and PCCM consulted.    Lines and / Drains: none  Cultures/autoimmune: BCx2 4/8>>> Sputum 4/8>>> Cryptococcal Ag 4/10>>>NEG ANA 4/8>>> NEG  Antibiotics: Azithro 4/10>>> Cefepime 4/10>>> Bactrim 4/10>>>  SUBJ: Reports improved dyspnea. No new complaints  Vital Signs: Temp:  [96.4 F (35.8 C)-98.3 F (36.8 C)] 98.3 F (36.8 C) (04/11 1200) Pulse Rate:  [78-96] 78  (04/11 1400) Resp:  [22-37] 22  (04/11 1400) BP: (103-124)/(60-75) 104/60 mmHg (04/11 1400) SpO2:  [73 %-100 %] 98 % (04/11 1400) FiO2 (%):  [40 %-50 %] 40 % (04/11 0749) Weight:  [51.8 kg (114 lb 3.2 oz)] 51.8 kg (114 lb 3.2 oz) (04/10 1800) I/O last 3 completed shifts: In: 2321 [P.O.:180; I.V.:1625; IV Piggyback:516] Out: 2250 [Urine:2250]  Physical Examination:  General: frail, thin, anxious male Neuro: awake and alert, MAe CV: s1s2 rrr PULM: No overt increased WOB with any activit, basilalr crackles posteriorly GI: soft, nontender, non osm Extremities:  No edema    Labs and Imaging:  No new CXR  CBC    Component Value Date/Time   WBC 6.0 05/01/2011 1235   RBC 2.59* 05/01/2011 1235   HGB 7.7* 05/01/2011 1235   HCT 23.1* 05/01/2011 1235   PLT 426* 05/01/2011 1235   MCV 89.2 05/01/2011 1235   MCH 29.7 05/01/2011 1235   MCHC 33.3 05/01/2011 1235   RDW 15.1 05/01/2011 1235   LYMPHSABS 1.9 02/02/2010 1259   MONOABS 0.5 02/02/2010 1259   EOSABS 0.0 02/02/2010 1259   BASOSABS 0.0 02/02/2010 1259    BMET    Component Value Date/Time   NA 133* 05/01/2011 0430   K 4.7 05/01/2011 0430   CL 109 05/01/2011 0430   CO2  16* 05/01/2011 0430   GLUCOSE 124* 05/01/2011 0430   BUN 29* 05/01/2011 0430   CREATININE 1.25 05/01/2011 0430   CALCIUM 8.1* 05/01/2011 0430   GFRNONAA 73* 05/01/2011 0430   GFRAA 84* 05/01/2011 0430        Assessment and Plan:  1. Acute resp failure -- presumed Pneumocystis PNA in pt with newly diagnosed HIV/AIDS (CD4=20). Appears less dyspneic PLAN -  Cont abx - per ID  Will be available as needed for FOB or if he deteriorates and needs ICU level of care     Billy Fischer, MD;  PCCM service; Mobile [phone]

## 2011-05-02 ENCOUNTER — Telehealth: Payer: Self-pay

## 2011-05-02 ENCOUNTER — Inpatient Hospital Stay (HOSPITAL_COMMUNITY): Payer: Medicaid Other

## 2011-05-02 LAB — CBC
HCT: 23.6 % — ABNORMAL LOW (ref 39.0–52.0)
Hemoglobin: 7.8 g/dL — ABNORMAL LOW (ref 13.0–17.0)
MCH: 29.4 pg (ref 26.0–34.0)
MCV: 89.1 fL (ref 78.0–100.0)
RBC: 2.65 MIL/uL — ABNORMAL LOW (ref 4.22–5.81)

## 2011-05-02 LAB — BASIC METABOLIC PANEL
BUN: 28 mg/dL — ABNORMAL HIGH (ref 6–23)
CO2: 18 mEq/L — ABNORMAL LOW (ref 19–32)
Calcium: 8.6 mg/dL (ref 8.4–10.5)
Glucose, Bld: 96 mg/dL (ref 70–99)
Sodium: 130 mEq/L — ABNORMAL LOW (ref 135–145)

## 2011-05-02 MED ORDER — BOOST / RESOURCE BREEZE PO LIQD
1.0000 | Freq: Three times a day (TID) | ORAL | Status: DC
  Administered 2011-05-02 (×2): 1 via ORAL

## 2011-05-02 MED ORDER — EMTRICITABINE-TENOFOVIR DF 200-300 MG PO TABS
1.0000 | ORAL_TABLET | Freq: Every day | ORAL | Status: DC
  Administered 2011-05-02 – 2011-05-15 (×14): 1 via ORAL
  Filled 2011-05-02 (×14): qty 1

## 2011-05-02 MED ORDER — ALBUTEROL SULFATE (5 MG/ML) 0.5% IN NEBU
2.5000 mg | INHALATION_SOLUTION | Freq: Three times a day (TID) | RESPIRATORY_TRACT | Status: DC
  Administered 2011-05-02 – 2011-05-09 (×18): 2.5 mg via RESPIRATORY_TRACT
  Filled 2011-05-02 (×19): qty 0.5

## 2011-05-02 MED ORDER — RITONAVIR 100 MG PO TABS
100.0000 mg | ORAL_TABLET | Freq: Every day | ORAL | Status: DC
  Administered 2011-05-02 – 2011-05-06 (×5): 100 mg via ORAL
  Filled 2011-05-02 (×5): qty 1

## 2011-05-02 MED ORDER — ALBUTEROL SULFATE (5 MG/ML) 0.5% IN NEBU: 2.5000 mg | INHALATION_SOLUTION | Freq: Three times a day (TID) | RESPIRATORY_TRACT | Status: DC

## 2011-05-02 MED ORDER — SODIUM POLYSTYRENE SULFONATE 15 GM/60ML PO SUSP
15.0000 g | Freq: Once | ORAL | Status: AC
  Administered 2011-05-02: 15 g via ORAL
  Filled 2011-05-02: qty 60

## 2011-05-02 MED ORDER — IPRATROPIUM BROMIDE 0.02 % IN SOLN
0.5000 mg | Freq: Three times a day (TID) | RESPIRATORY_TRACT | Status: DC
  Administered 2011-05-02 – 2011-05-09 (×19): 0.5 mg via RESPIRATORY_TRACT
  Filled 2011-05-02 (×20): qty 2.5

## 2011-05-02 MED ORDER — DARUNAVIR ETHANOLATE 800 MG PO TABS
800.0000 mg | ORAL_TABLET | Freq: Every day | ORAL | Status: DC
  Administered 2011-05-02: 800 mg via ORAL
  Filled 2011-05-02 (×2): qty 1

## 2011-05-02 MED ORDER — IPRATROPIUM BROMIDE 0.02 % IN SOLN: 0.5000 mg | Freq: Four times a day (QID) | RESPIRATORY_TRACT | Status: DC

## 2011-05-02 NOTE — Progress Notes (Signed)
Subjective: Pt again with difficulties last night but now with improved breathing   Antibiotics:  Anti-infectives     Start     Dose/Rate Route Frequency Ordered Stop   05/02/11 1000   Darunavir Ethanolate (PREZISTA) tablet 800 mg        800 mg Oral Daily 05/02/11 0928     05/02/11 1000   ritonavir (NORVIR) tablet 100 mg        100 mg Oral Daily 05/02/11 0928     05/02/11 1000   emtricitabine-tenofovir (TRUVADA) 200-300 MG per tablet 1 tablet        1 tablet Oral Daily 05/02/11 0928     05/01/11 2000  sulfamethoxazole-trimethoprim (BACTRIM) 255 mg in dextrose 5 % 250 mL IVPB       255 mg 265.9 mL/hr over 60 Minutes Intravenous Every 8 hours 05/01/11 1908     04/30/11 1600   azithromycin (ZITHROMAX) 500 mg in dextrose 5 % 250 mL IVPB  Status:  Discontinued        500 mg 250 mL/hr over 60 Minutes Intravenous Every 24 hours 04/30/11 1455 05/02/11 1044   04/30/11 1600   ceFEPIme (MAXIPIME) 1 g in dextrose 5 % 50 mL IVPB  Status:  Discontinued        1 g 100 mL/hr over 30 Minutes Intravenous Every 12 hours 04/30/11 1455 05/02/11 1044   04/29/11 1100   sulfamethoxazole-trimethoprim (BACTRIM) 255 mg in dextrose 5 % 250 mL IVPB  Status:  Discontinued        255 mg 265.9 mL/hr over 60 Minutes Intravenous Every 8 hours 04/29/11 1008 05/01/11 1909   04/28/11 1600   sulfamethoxazole-trimethoprim (BACTRIM DS) 800-160 MG per tablet 2 tablet  Status:  Discontinued        2 tablet Oral 3 times daily 04/28/11 1348 04/29/11 1008   04/28/11 1330   sulfamethoxazole-trimethoprim (BACTRIM,SEPTRA) 400-80 MG per tablet 2.5 tablet  Status:  Discontinued        2.5 tablet Oral 4 times per day 04/28/11 1224 04/28/11 1348   04/28/11 1100   sulfamethoxazole-trimethoprim (BACTRIM DS) 800-160 MG per tablet 1 tablet  Status:  Discontinued        1 tablet Oral Daily 04/28/11 1046 04/28/11 1224   04/27/11 2345   cefTRIAXone (ROCEPHIN) 1 g in dextrose 5 % 50 mL IVPB        1 g 100 mL/hr over 30 Minutes  Intravenous  Once 04/27/11 2338 04/28/11 0028   04/27/11 2345   azithromycin (ZITHROMAX) 500 mg in dextrose 5 % 250 mL IVPB        500 mg 250 mL/hr over 60 Minutes Intravenous  Once 04/27/11 2338 04/28/11 0128          Medications: Scheduled Meds:    . albuterol  2.5 mg Nebulization QID   And  . ipratropium  0.5 mg Nebulization QID  . antiseptic oral rinse  15 mL Mouth Rinse BID  . darunavir  800 mg Oral Daily  . docusate sodium  100 mg Oral BID  . emtricitabine-tenofovir  1 tablet Oral Daily  . feeding supplement  1 Container Oral TID BM  . ferrous sulfate  325 mg Oral TID WC  . heparin  5,000 Units Subcutaneous Q8H  . methylPREDNISolone (SOLU-MEDROL) injection  125 mg Intravenous Q6H  . mirtazapine  15 mg Oral QHS  . ritonavir  100 mg Oral Daily  . sodium chloride  3 mL Intravenous Q12H  . sulfamethoxazole-trimethoprim  255 mg  Intravenous Q8H  . DISCONTD: azithromycin  500 mg Intravenous Q24H  . DISCONTD: ceFEPime (MAXIPIME) IV  1 g Intravenous Q12H  . DISCONTD: feeding supplement  237 mL Oral BID BM  . DISCONTD: sulfamethoxazole-trimethoprim  255 mg Intravenous Q8H   Continuous Infusions:    . sodium chloride 75 mL/hr at 05/01/11 1139   PRN Meds:.acetaminophen, acetaminophen, albuterol, albuterol, alum & mag hydroxide-simeth, benzonatate, ondansetron (ZOFRAN) IV, ondansetron, oxyCODONE, zolpidem, DISCONTD: docusate sodium   Objective: Weight change: 10 lb 5.8 oz (4.7 kg)  Intake/Output Summary (Last 24 hours) at 05/02/11 1045 Last data filed at 05/02/11 0856  Gross per 24 hour  Intake 1835.94 ml  Output   2050 ml  Net -214.06 ml   Blood pressure 111/71, pulse 79, temperature 97.1 F (36.2 C), temperature source Oral, resp. rate 25, height 6' (1.829 m), weight 124 lb 9 oz (56.5 kg), SpO2 100.00%. Temp:  [97.1 F (36.2 C)-98.5 F (36.9 C)] 97.1 F (36.2 C) (04/12 0758) Pulse Rate:  [74-101] 79  (04/12 0758) Resp:  [22-29] 25  (04/12 0758) BP:  (100-115)/(60-83) 111/71 mmHg (04/12 0758) SpO2:  [84 %-100 %] 100 % (04/12 0758) Weight:  [124 lb 9 oz (56.5 kg)] 124 lb 9 oz (56.5 kg) (04/12 0052)  Physical Exam: General: Alert and awake, oriented x3, a HEENT: anicteric sclera, pupils reactive to light and accommodation, EOMI CVS, normal r,  no murmur rubs or gallops Chest: wheezes and rhonchi esp posteriorly Abdomen: soft nontender, nondistended, normal bowel sounds, Extremities: no  clubbing or edema noted bilaterally Skin: no rashes  Neuro: nonfocal  Lab Results:  Basename 05/02/11 0435 05/01/11 1235  WBC 6.2 6.0  HGB 7.8* 7.7*  HCT 23.6* 23.1*  PLT 433* 426*    BMET  Basename 05/02/11 0435 05/01/11 0430  NA 130* 133*  K 5.3* 4.7  CL 104 109  CO2 18* 16*  GLUCOSE 96 124*  BUN 28* 29*  CREATININE 1.18 1.25  CALCIUM 8.6 8.1*    Micro Results: Recent Results (from the past 240 hour(s))  CULTURE, BLOOD (ROUTINE X 2)     Status: Normal (Preliminary result)   Collection Time   04/28/11 12:30 AM      Component Value Range Status Comment   Specimen Description BLOOD RIGHT ARM   Final    Special Requests BOTTLES DRAWN AEROBIC ONLY 5CC   Final    Culture  Setup Time [id number]   Final    Culture     Final    Value:        BLOOD CULTURE RECEIVED NO GROWTH TO DATE CULTURE WILL BE HELD FOR 5 DAYS BEFORE ISSUING A FINAL NEGATIVE REPORT   Report Status PENDING   Incomplete   CULTURE, BLOOD (ROUTINE X 2)     Status: Normal (Preliminary result)   Collection Time   04/28/11 12:40 AM      Component Value Range Status Comment   Specimen Description BLOOD RIGHT WRIST   Final    Special Requests BOTTLES DRAWN AEROBIC ONLY 2CC   Final    Culture  Setup Time [id number]   Final    Culture     Final    Value:        BLOOD CULTURE RECEIVED NO GROWTH TO DATE CULTURE WILL BE HELD FOR 5 DAYS BEFORE ISSUING A FINAL NEGATIVE REPORT   Report Status PENDING   Incomplete   MRSA PCR SCREENING     Status: Normal   Collection Time  04/28/11  4:14 AM      Component Value Range Status Comment   MRSA by PCR NEGATIVE  NEGATIVE  Final     Studies/Results: Dg Chest Port 1 View  04/30/2011  *RADIOLOGY REPORT*  Clinical Data: Increasing hypoxia.  The T tip anemia.  PORTABLE CHEST - 1 VIEW  Comparison: Two-view chest 04/30/2011.  Findings: Progressive left interstitial airspace disease is present.  Heart size is normal.  Interstitial and airspace disease on the right is stable.  The visualized soft tissues and bony thorax are unremarkable.  IMPRESSION:  1.  Progressive interstitial airspace disease on the left.  This is concerning for an atypical infection.  Asymmetric edema could have this appearance. 2.  Similar appearance of right-sided disease.  Original Report Authenticated By: Jamesetta Orleans. MATTERN, M.D.      Assessment/Plan: Daniel House is a 36 y.o. male with  Newly diagnosed HIV/AIDS, and PCP pneumonia:   1) PCP Pneumonia: Primary team has (as of time of this note added back cefepime and azthromycin for HCAP and atypical coverage). I still do not think this is bacterial infection but rather just very bad case of PCP in pt with poor pulmoary reserve at baseline perhaps due to COPD. I was concerned 2days agoabout his respiratory status and I am glad he is being watched very closely  --continue high dose IV steroids, IV bactrim for PCP --DC the cefepime --reasonable to continue the azithromycin for now but would dc it as well in a few days --check legionella ag --give flu and pneumovax vaccines if not done so far   2) Rule out TB: I always thought TB was not likely. He is afebrile currently and the most likely dx is PCP. Will dc airborne and sputa for TB. PPD fwiw was NR.   2) HIV/AIDS:  Genotype pending. Cr normalized --I will start the pt on prezista/norvir and truvada --Bridge Counselor Daniel House will visit and help pt apply for emergency ADAP --May need creative means to get him ARV in period between DC and ADAP  approval depending on length of stay --I will also write for zofran in case he has nausea with this regimen  Dr. Orvan Falconer available on the weekend for questions.    LOS: 5 days   Acey Lav 05/02/2011, 10:45 AM

## 2011-05-02 NOTE — Progress Notes (Signed)
Clinical Social Work Department BRIEF PSYCHOSOCIAL ASSESSMENT 05/02/2011  Patient:  Daniel House, Daniel House     Account Number:  [id number]     Admit date:  04/27/2011  Clinical Social Worker:  Lourdes Sledge  Date/Time:  05/02/2011 02:54 PM  Referred by:  Physician  Date Referred:  05/02/2011 Referred for  Other - See comment   Other Referral:   Pt has been newly diagnosed with HIV and presents with depression   Interview type:  Patient Other interview type:    PSYCHOSOCIAL DATA Living Status:  [place] Admitted from facility:   Level of care:   Primary support name:  Missy Travelstead Primary support relationship to patient:  SIBLING Degree of support available:   Pt states he has two sisters that he is very close to. Pt states he has not spoken to his family about his diagnosis however plans to talk to his sister within the next day.    CURRENT CONCERNS Current Concerns  Adjustment to Illness   Other Concerns:   Depression    SOCIAL WORK ASSESSMENT / PLAN CSW received referral as pt has been diagnosed with HIV and since admission has appeared depressed. CSW visited pt room and as CSW was entering pt sister was leaving. CSW spoke with pt in private and introduced herself and her role. Pt appreciative of visit and stated he was open to talking to CSW. CSW explored pt feelings of his diagnosis. Pt was very tearful and stated he "is just taking it all in." CSW explored whether pt has talked to anyone else besides staff about his diagnosis. Pt stated he was going to talk to his sister today however he just wasn't ready. Pt stated he plans to tell his sister the next time she comes in as she knows she will be extremely supportive. Pt identified the strengths in having a supportive and caring family. CSW asked pt if he wanted to talk about his concerns, worries or fears however pt stated he was not able to really process his feelings at that time. CSW informed pt that his feelings were normal and  that it may take him time to process the news he has received however CSW recommended pt consider counseling at discharge. Pt stated he agreed counseling would be beneficial to discuss his feelings regarding his HIV, and what this meant for pt. During the conversation with CSW pt stated he had been sexually active with a male a few months ago, however did not exactly remember when. Pt denied being sexually active or in a relationship with anyone at this time. CSW observed pt appeared tired, CSW explored whether pt had any questions, wanted to talk with CSW about anything in particular or if he needed anything. Pt denied needing anything else. CSW informed the ID team is involved in pt care and pt should be connected to resources pertaining to his diagnosis. CSW will contact the bridge counselor and ensure pt will receive the resources he needs at dc.   Assessment/plan status:  Information/Referral to [facility] Other assessment/ plan:   Information/referral to community resources:   Pt will continue to follow up with pt and explore feelings and needs. CSW will provide pt with outpatient counseling resources at dc as well as resources pertaining to his diagnosis.    PATIENT'S/FAMILY'S RESPONSE TO PLAN OF CARE: Pt laying in bed, alert, oriented and friendly. Pt was open to discussing feelings with CSW however CSW observed that pt is still processing his feelings. Pt reports having a supportive  sister in particular that he plans on talking to abou this diagnosis. Pt reports being unsure and a little fearful about his diagnosis as he does not know what to expect or what this will mean for him. Pt is open in considering outpatient counseling resources to further discuss his feelings, challenges pertaining to his diagnosis. Pt denies having additional questions or needs for CSW.        Theresia Bough, MSW, Theresia Majors [phone]

## 2011-05-02 NOTE — Telephone Encounter (Signed)
Faxed ADAP application to Nobie Putnam at [facility] today 4/12,  for this patient who is hospitalized and just diagnosed with HIV, counts extremely out of whack, per Sharol Roussel and Dr. Daiva Eves - patient receives unemployment, but since in hospital, unable to provide proof of income. ADAP will let us know if they require further information or will approve as is.

## 2011-05-02 NOTE — Progress Notes (Signed)
Nutrition Follow-up  Pt advanced to Regular diet this morning; previously on full liquid diet. Newly dx with HIV/AIDS.   Ensure Complete changed to Ensure Pudding. Pt and RN reports pt does not like the Ensure Complete or Pudding. Agreeable to trying [employer]. Remeron started 4/11.   Pt with altered taste, saying nothing tastes good to him. Ate 1/3 of a grilled ham and cheese sandwich this morning. Said he wanted pizza for lunch, this RD ordered meal for patient.  Pt not meeting nutrition needs, recommend nutrition support; specific recommendations below. Note pt may be at risk for refeeding syndrome given dx of severe malnutrition, recommendations also listed below.  Diet Order:  Regular Supplements: Ensure Complete BID  Meds: Scheduled Meds:   . albuterol  2.5 mg Nebulization QID   And  . ipratropium  0.5 mg Nebulization QID  . antiseptic oral rinse  15 mL Mouth Rinse BID  . darunavir  800 mg Oral Daily  . docusate sodium  100 mg Oral BID  . emtricitabine-tenofovir  1 tablet Oral Daily  . feeding supplement  1 Container Oral TID BM  . ferrous sulfate  325 mg Oral TID WC  . heparin  5,000 Units Subcutaneous Q8H  . methylPREDNISolone (SOLU-MEDROL) injection  125 mg Intravenous Q6H  . mirtazapine  15 mg Oral QHS  . ritonavir  100 mg Oral Daily  . sodium chloride  3 mL Intravenous Q12H  . sulfamethoxazole-trimethoprim  255 mg Intravenous Q8H  . DISCONTD: azithromycin  500 mg Intravenous Q24H  . DISCONTD: ceFEPime (MAXIPIME) IV  1 g Intravenous Q12H  . DISCONTD: sulfamethoxazole-trimethoprim  255 mg Intravenous Q8H   Continuous Infusions:   . sodium chloride 75 mL/hr at 05/01/11 1139   PRN Meds:.acetaminophen, acetaminophen, albuterol, albuterol, alum & mag hydroxide-simeth, benzonatate, ondansetron (ZOFRAN) IV, ondansetron, oxyCODONE, zolpidem  Labs:  CMP     Component Value Date/Time   NA 130* 05/02/2011 0435   K 5.4* 05/02/2011 1000   CL 104 05/02/2011 0435   CO2 18*  05/02/2011 0435   GLUCOSE 96 05/02/2011 0435   BUN 28* 05/02/2011 0435   CREATININE 1.18 05/02/2011 0435   CALCIUM 8.6 05/02/2011 0435   PROT 6.1 05/01/2011 0430   ALBUMIN 1.6* 05/01/2011 0430   AST 52* 05/01/2011 0430   ALT 9 05/01/2011 0430   ALKPHOS 120* 05/01/2011 0430   BILITOT <0.1* 05/01/2011 0430   GFRNONAA 78* 05/02/2011 0435   GFRAA >90 05/02/2011 0435     Intake/Output Summary (Last 24 hours) at 05/02/11 1149 Last data filed at 05/02/11 0856  Gross per 24 hour  Intake 1710.94 ml  Output   2050 ml  Net -339.06 ml   Ht: 6' (182.9 cm)  Weight Status:  56.5 kg, wt up 9.4 kg x 5 days  Estimated needs:  1500 - 1700 kcal, 70 - 80 grams protein  Nutrition Dx:  Inadequate oral intake r/t poor appetite AEB pt report.  Goal:  Meet >90% of estimated nutrition needs to promote energy & protein repletion  Interventions:   1. Discontinue Ensure products and switch to [employer] to promote better supplement acceptance. 2. If more aggressive nutrition support desired, recommend placing enteral feeding tube and initiating Osmolite 1.2 at 20 ml/hr, advance 10 ml/hr every 8 hours (or as tolerated) to goal of 60 ml/hr. This will provide: 1728 kcal, 80 grams protein, 1180 ml free water. Pt may be at risk for refeeding syndrome given dx of severe malnutrition. Recommend monitoring potassium, phosphorus, and  magnesium. Replete prn. 3. RD to continue to follow nutrition care plan  Monitor:  PO intake, labs, weight, I/O's  Adair Laundry Pager #:  [phone]

## 2011-05-02 NOTE — Progress Notes (Signed)
Patient ID: Daniel House, male   DOB: 1975/09/05, 36 y.o.   MRN: [id number]  PGY-1 Daily Progress Note Family Medicine Teaching Service Kelechi Orgeron M. Kazuko Clemence, MD Service Pager: [phone]  Subjective:  Patient required venturi mask overnight for a few hours. Sats >90% on 5L [place]. Patient states he feels a little better. Voice is hoarse from using venturi mask, but states his breathing "may be a little better". Patient states he would like a ham and cheese sandwich to eat.   I have reviewed the patient's medications.  Objective Temp:  [97.1 F (36.2 C)-98.5 F (36.9 C)] 97.1 F (36.2 C) (04/12 0758) Pulse Rate:  [74-101] 79  (04/12 0758) Resp:  [22-29] 25  (04/12 0758) BP: (100-116)/(60-83) 111/71 mmHg (04/12 0758) SpO2:  [84 %-100 %] 100 % (04/12 0758) Weight:  [124 lb 9 oz (56.5 kg)] 124 lb 9 oz (56.5 kg) (04/12 0052)   Intake/Output Summary (Last 24 hours) at 05/02/11 0822 Last data filed at 05/02/11 0801  Gross per 24 hour  Intake 1988.94 ml  Output   1850 ml  Net 138.94 ml   General: awake, alert, lying in bed at 30 degree angle, appears more comfortable. [place] in place HEENT: AT/[place], sclera white, MMM Lymph: no cervical or supraclavicular LAD CV: RRR, no murmurs Pulm: diffuse wheezes and crackles noted at bases, but improved  Abd: firm abdomen but not tense (likely secondary to body habitus) +BS, NTND Ext: no edema, warm.  Neuro: alert, oriented  Labs and Imaging  Lab 05/02/11 0435 05/01/11 1235 05/01/11 0430  WBC 6.2 6.0 6.2  HGB 7.8* 7.7* 7.5*  HCT 23.6* 23.1* 22.9*  PLT 433* 426* 419*    Lab 05/02/11 0435 05/01/11 0430 04/30/11 0500 04/28/11 0635  NA 130* 133* 135 --  K 5.3* 4.7 4.5 --  CL 104 109 109 --  CO2 18* 16* 16* --  BUN 28* 29* 36* --  CREATININE 1.18 1.25 1.47* --  CALCIUM 8.6 8.1* 8.1* --  PROT -- 6.1 6.5 7.3  BILITOT -- <0.1* 0.1* 0.2*  ALKPHOS -- 120* 130* 114  ALT -- 9 10 7   AST -- 52* 57* 48*  GLUCOSE 96 124* 153* --   TSH 0.792 ACE 38 CD4  20 HIV Quant 837,756 LDH 715 Crytpo Neg  Assessment and Plan 36 year old Male with no significant PMHx who presents with 1 week of dyspnea, found to be tachycardic and hypoxic in ER, imagining concerning for interstitial lung disease:   1. Pulmonary - Imaging and clinical picture concerning for PCP pneumonia, or atypical infection.  Cannot rule out superimposed CAP.  Patient having increased O2 requirement. Transferred to SDU on 04/30/11 due to worsening respiratory status. Repeat CXR shows worsening opacities. Respiratory status somewhat improved today, although he still required venturi mask overnight. -HIV positive. Awaiting confirmation Western Blot. CD4 count 20. HIV viral load 837K. -ID consulted. We appreciate their help with the care of this patient, as well as arranging for patient to receive HIV meds. -Continue on Bactrim IV 15 mg/kg of TMP divided q8 hours on 04/29/11. This is day #5 of antibiotics -Switched from prednisone to Solumedrol. Continue Solumedrol (day #4) -Continue O2 via [place], but use mask as needed to maintain sats. CCM was consulted and are aware of patient. He could quickly decompensate, at which time CCM should be called, but otherwise patient is stable. -Will switch neb treatments to prn -PPD negative, but concern if patient can mount adequate response given immunosuppression. It should remain  on our differential until we get sputum samples. As far as contact is concerned for patient, he is still on contact for PPD. Will defer to ID to determine if this contact is appropriate, or if we should use respiratory contact to protect patient given his immunosuppression. -We truly appreciate ID's consult and help with caring for this patient. -Discussed goals of care with patient yesterday.  [name], whose phone number is listed in patient's room if needed. I discussed with him the chance he may need to be intubated and we would need someone  to make decisions about his care.  2. Acute renal insufficiency- may be due to dehydration +/- NSAID use, but again concerning for pulmonary-renal cause of illness or HIVAN. UA significant for spec grav at 1.040, 100 of protein, and red cells on admission.  Creatinine is trending down after fluids. BUN improving. Overall labs are improving. Creat 1.18 this morning. This is important as patient is to begin antiretroviral therapy. -IVF decreased to 75 cc/hr given his improvement of renal function. He has gained a significant amount of weight most likely related to fluids. Patient's BP has been stable, but I feel he should remain on some IVF at this time. -Avoid NSAIDs  3. Anemia- HgB 7.8 this morning, which is down 3 points from admission but appears to be stable at this time. Initial drop in HgB contributed to hemodilution. No signs of active bleeding. - Discussed his HgB with patient yesterday. States he is not having frequent stools. Last BM was 2-3 days ago and was not dark or bloody. - Started on iron yesterday  - Continue to monitor. - Transfusion threshold is less than 6.0  4. FEN/GI- patient appears malnourished, albumin low at 2.1. Will allow regular diet, give zofran prn, and protonix for GI prophylaxis. NS decrease to 75 cc/hr. States nothing tastes good to him. - Started on Remeron yesterday to help with appetite.  - Encourage patient to eat whatever he wants, which is a sandwich this morning. Will continue to encourage him to eat Ensure pudding. - Weight up 10lbs since admission to the floor. Continue to monitor I/O and daily weights.  5. DVT ppx- SQ heparin   6. Disposition- Pending clinical improvement and further work up by ID. Patient is stable at this time, but if his respiratory status declines, will have low threshold to intubate and call CCM. If patient remains stable, will consider transfer back to floor.  [name], Mehtab Dolberry Pager: [phone] 05/02/2011, 8:22  AM

## 2011-05-02 NOTE — Progress Notes (Signed)
Patient has been asking to take off the venturi mask.  Patient has not been able to maintain sats when off of the mask throughout the night, however now the patient has remained in 90s on 5L nasal cannula so I have agreed to let the patient wear the nasal cannula again at this time.  Will monitor patient and will replace mask if needed.   Timmie Foerster, RN

## 2011-05-02 NOTE — Progress Notes (Signed)
Patient has been on 5L O2 via  and has been having O2 saturation drop into 70s and sustaining 70s-80s.  Patient has been placed back on ventrui mask at 40% and O2 saturation has come back up to 95-100%.  Patient reports feeling ease of breathing at this time.  Will keep venturi mask on overnight and will continue to monitor the patient. Timmie Foerster, RN

## 2011-05-02 NOTE — Progress Notes (Signed)
FMTS Attending Note  Patient seen and examined by me, discussed with Dr. Mikel Cella and I agree with her plan. Patient appears more comfortable today than yesterday, breathing comfortably and not tachypneic on 5L/min nasal canula oxygen.   Improvement in serum creatinine as well. Increased appetite since decision to start Remeron.   Plan to continue bactrim and prednisone. Clinically it does not appear that he has TB, so may discontinue respiratory isolation.  Appreciate ID and SW help in their efforts to acquire retroviral access for patient.    Additionally, Mr. Silverman has not yet discussed his recent HIV diagnosis with any family members.  He plans to discuss with his sister [name] when she comes to visit him today.  Paula Compton, MD

## 2011-05-03 LAB — HIV 1/2 CONFIRMATION
HIV-1 antibody: POSITIVE
HIV-2 Ab: NEGATIVE

## 2011-05-03 LAB — COMPREHENSIVE METABOLIC PANEL
BUN: 25 mg/dL — ABNORMAL HIGH (ref 6–23)
CO2: 20 mEq/L (ref 19–32)
Calcium: 8.6 mg/dL (ref 8.4–10.5)
Creatinine, Ser: 1.13 mg/dL (ref 0.50–1.35)
GFR calc Af Amer: >90 mL/min (ref >90)
GFR calc non Af Amer: 82 mL/min — ABNORMAL LOW (ref >90)
Glucose, Bld: 101 mg/dL — ABNORMAL HIGH (ref 70–99)

## 2011-05-03 LAB — CBC
HCT: 23.6 % — ABNORMAL LOW (ref 39.0–52.0)
Hemoglobin: 7.8 g/dL — ABNORMAL LOW (ref 13.0–17.0)
MCH: 29.3 pg (ref 26.0–34.0)
MCV: 88.7 fL (ref 78.0–100.0)
Platelets: 441 10*3/uL — ABNORMAL HIGH (ref 150–400)
RBC: 2.66 MIL/uL — ABNORMAL LOW (ref 4.22–5.81)

## 2011-05-03 MED ORDER — DARUNAVIR ETHANOLATE 800 MG PO TABS
800.0000 mg | ORAL_TABLET | Freq: Every day | ORAL | Status: DC
  Administered 2011-05-03 – 2011-05-15 (×13): 800 mg via ORAL
  Filled 2011-05-03 (×17): qty 1

## 2011-05-03 NOTE — Progress Notes (Signed)
Patient ID: Daniel House, male   DOB: 25-Mar-1975, 36 y.o.   MRN: [id number]  Daily Progress Note Family Medicine Teaching Service Service Pager: [phone]  Subjective:  Sleeping comfortably. Denies discomfort upon awakening.  Says appetite was slightly better yesterday.  He also had a bowel movement.   I have reviewed the patient's medications.  Objective Temp:  [97 F (36.1 C)-97.6 F (36.4 C)] 97.4 F (36.3 C) (04/13 0400) Pulse Rate:  [74-97] 78  (04/13 0400) Resp:  [17-32] 24  (04/13 0100) BP: (105-118)/(66-82) 109/73 mmHg (04/13 0400) SpO2:  [79 %-100 %] 100 % (04/13 0400) FiO2 (%):  [35 %] 35 % (04/12 1700)   Intake/Output Summary (Last 24 hours) at 05/03/11 0608 Last data filed at 05/03/11 0400  Gross per 24 hour  Intake   2188 ml  Output   2025 ml  Net    163 ml   General: awake, alert, lying flat in bed, comfortable. [place] in place HEENT: AT/[place], sclera white, MMM Lymph: no cervical or supraclavicular LAD CV: RRR, no murmurs Pulm: diffuse wheezes and crackles noted at bases, improved  Abd: firm abdomen but not tense (likely secondary to body habitus) +BS, NTND Ext: no edema, warm.  Neuro: alert, oriented  Labs and Imaging  Lab 05/03/11 0500 05/02/11 0435 05/01/11 1235  WBC 6.3 6.2 6.0  HGB 7.8* 7.8* 7.7*  HCT 23.6* 23.6* 23.1*  PLT 441* 433* 426*    Lab 05/03/11 0500 05/02/11 1000 05/02/11 0435 05/01/11 0430 04/30/11 0500  NA 132* -- 130* 133* --  K 4.8 5.4* 5.3* -- --  CL 102 -- 104 109 --  CO2 20 -- 18* 16* --  BUN 25* -- 28* 29* --  CREATININE 1.13 -- 1.18 1.25 --  CALCIUM 8.6 -- 8.6 8.1* --  PROT 6.1 -- -- 6.1 6.5  BILITOT 0.2* -- -- <0.1* 0.1*  ALKPHOS 125* -- -- 120* 130*  ALT 17 -- -- 9 10  AST 54* -- -- 52* 57*  GLUCOSE 101* -- 96 124* --   TSH 0.792 ACE 38 CD4 20 HIV Quant 837,756 LDH 715 Crytpo Neg RPR+, Titer 1:16  Assessment and Plan 36 year old Male with no significant PMHx who presents with 1 week of dyspnea, found to be  tachycardic and hypoxic in ER, imagining concerning for interstitial lung disease:   1. Pulmonary - Imaging and clinical picture concerning for PCP pneumonia, or atypical infection. Transferred to SDU on 04/30/11 due to worsening respiratory status. Respiratory status seems stable, but when ambulating yesterday still required venturi mask to keep oxygen sats up.  -HIV positive. Awaiting confirmation Western Blot. CD4 count 20. HIV viral load 837K. -ID consulted. We appreciate their help with the care of this patient, as well as arranging for patient to receive HIV meds. - RPR came back last night reactive, with titer of 1:16.  Confirmation test sent.  There is an increased incidence of false + RPR in HIV patients, will defer to ID to decide if/when to treat while awaiting confirmation test.  -Continue on Bactrim IV 15 mg/kg of TMP divided q8 hours on 04/29/11. This is day #5 of antibiotics -Continue Solumedrol (day #4) - Antiretroviral therapy per ID -Continue O2 via , but use mask as needed to maintain sats. CCM was consulted and are aware of patient. He could quickly decompensate, at which time CCM should be called, but otherwise patient is stable. -Will switch neb treatments to prn -PPD negative -We truly appreciate ID's consult and  help with caring for this patient. - [name], whose phone number is listed in patient's room if needed.  2. Acute renal insufficiency- may be due to dehydration +/- NSAID use, but again concerning for pulmonary-renal cause of illness or HIVAN. UA significant for spec grav at 1.040, 100 of protein, and red cells on admission.  - Creatinine continues to improve.   - IVF @ 21 cc's/hr, patient has good urine output and normotensive.  Will continue pending appetite improvement/fluid intake.   3. Anemia- HgB 7.8 this morning, which is down 3 points from admission but appears to be stable at this time. Initial drop in HgB  contributed to hemodilution. No signs of active bleeding. - Discussed his HgB with patient yesterday. States he is not having frequent stools. - Denies dark/bloody bowel movement.  - Continue Iron - Continue to monitor. - Transfusion threshold is less than 6.0  4. FEN/GI- patient appears malnourished, albumin low at 2.1. Will allow regular diet, give zofran prn, and protonix for GI prophylaxis. NS decrease to 75 cc/hr. States nothing tastes good to him. - Started on Remeron yesterday to help with appetite.  - Encourage patient to eat whatever he wants- Weight up 10lbs since admission to the floor. Continue to monitor I/O and daily weights.  5. DVT ppx- SQ heparin   6. Disposition- Pending clinical improvement and further work up by ID. Patient is stable at this time, will consider transfer back to floor.  Code: Full   Kline Bulthuis Pager: [phone] 05/03/2011, 6:55 AM

## 2011-05-03 NOTE — Progress Notes (Signed)
Patient seen and examined by me today, discussed with resident Dr Lula Olszewski and I agree with her assessment/plan.  Patient reports feeling improved this morning; did require venturi mask with ambulation once since yesterday.  The FMTS team has not discussed his positive RPR with patient at this time; unclear to [place] whether to initiate PCN for presumed primary/secondary syphilis, await more confirmatory testing, or if patient requires further workup (ie, LP). Will watch in SDU for today, until more reassured that patient's improvement in respiratory status is not transitory. Paula Compton, MD

## 2011-05-04 LAB — CBC
MCHC: 34.3 g/dL (ref 30.0–36.0)
Platelets: 414 10*3/uL — ABNORMAL HIGH (ref 150–400)
RDW: 14.5 % (ref 11.5–15.5)
WBC: 6.7 10*3/uL (ref 4.0–10.5)

## 2011-05-04 LAB — CULTURE, BLOOD (ROUTINE X 2)
Culture  Setup Time: 201304080821
Culture  Setup Time: 201304080821
Culture: NO GROWTH

## 2011-05-04 LAB — BASIC METABOLIC PANEL
Chloride: 103 mEq/L (ref 96–112)
Creatinine, Ser: 1.11 mg/dL (ref 0.50–1.35)
GFR calc Af Amer: >90 mL/min (ref >90)
GFR calc non Af Amer: 84 mL/min — ABNORMAL LOW (ref >90)
Potassium: 4.9 mEq/L (ref 3.5–5.1)

## 2011-05-04 MED ORDER — WHITE PETROLATUM GEL
Status: AC
  Administered 2011-05-04: 23:00:00
  Filled 2011-05-04: qty 5

## 2011-05-04 NOTE — Progress Notes (Signed)
Patient ID: Daniel House, male   DOB: 09/23/1975, 36 y.o.   MRN: [id number]  Daily Progress Note Family Medicine Teaching Service Service Pager: [phone]  Subjective:  Required venturi mask once overnight, no recorded desats. Patient states he has a hard time getting comfortable in the bed. Appetite increased.  I have reviewed the patient's medications.  Objective Temp:  [97.2 F (36.2 C)-98 F (36.7 C)] 97.2 F (36.2 C) (04/14 0731) Pulse Rate:  [76-104] 87  (04/14 0731) Resp:  [19-28] 22  (04/14 0731) BP: (105-119)/(65-86) 105/86 mmHg (04/14 0731) SpO2:  [91 %-100 %] 97 % (04/14 0807) FiO2 (%):  [40 %] 40 % (04/14 0003) Weight:  [110 lb 3.7 oz (50 kg)] 110 lb 3.7 oz (50 kg) (04/14 0500)   Intake/Output Summary (Last 24 hours) at 05/04/11 0903 Last data filed at 05/04/11 0700  Gross per 24 hour  Intake   3100 ml  Output   1950 ml  Net   1150 ml   General: awake, alert, lying flat in bed, appears more uncomfortable. [place] in place HEENT: AT/[place], sclera white, MMM Lymph: no cervical or supraclavicular LAD CV: RRR, no murmurs Pulm: diffuse wheezes and crackles noted at bases, improved  Abd: firm abdomen but not tense (likely secondary to body habitus) +BS, NTND Ext: no edema, warm.  Neuro: alert, oriented  Labs and Imaging  Lab 05/04/11 0515 05/03/11 0500 05/02/11 0435  WBC 6.7 6.3 6.2  HGB 8.5* 7.8* 7.8*  HCT 24.8* 23.6* 23.6*  PLT 414* 441* 433*    Lab 05/04/11 0515 05/03/11 0500 05/02/11 1000 05/02/11 0435 05/01/11 0430 04/30/11 0500  NA 130* 132* -- 130* -- --  K 4.9 4.8 5.4* -- -- --  CL 103 102 -- 104 -- --  CO2 19 20 -- 18* -- --  BUN 24* 25* -- 28* -- --  CREATININE 1.11 1.13 -- 1.18 -- --  CALCIUM 8.6 8.6 -- 8.6 -- --  PROT -- 6.1 -- -- 6.1 6.5  BILITOT -- 0.2* -- -- <0.1* 0.1*  ALKPHOS -- 125* -- -- 120* 130*  ALT -- 17 -- -- 9 10  AST -- 54* -- -- 52* 57*  GLUCOSE 99 101* -- 96 -- --   TSH 0.792 ACE 38 CD4 20 HIV Quant 837,756 LDH 715 Crytpo  Neg RPR+, Titer 1:16  Assessment and Plan 36 year old Male with no significant PMHx who presents with 1 week of dyspnea, found to be tachycardic and hypoxic in ER, imagining concerning for interstitial lung disease:   1. Pulmonary - Imaging and clinical picture concerning for PCP pneumonia, or atypical infection. Transferred to SDU on 04/30/11 due to worsening respiratory status. Respiratory status seems stable, but when ambulating yesterday still required venturi mask to keep oxygen sats up.  -HIV positive. Awaiting confirmation Western Blot. CD4 count 20. HIV viral load 837K. -ID consulted. We appreciate their help with the care of this patient, as well as arranging for patient to receive HIV meds. - RPR came back last night reactive, with titer of 1:16.  Confirmation test sent.  There is an increased incidence of false + RPR in HIV patients, will defer to ID to decide if/when to treat while awaiting confirmation test.  -Continue on Bactrim IV 15 mg/kg of TMP divided q8 hours on 04/29/11. This is day #7 of antibiotics -Continue Solumedrol (day #6) - Antiretroviral therapy per ID -Continue O2 via [place], but use mask as needed to maintain sats. CCM was consulted and are  aware of patient. He could quickly decompensate, at which time CCM should be called, but otherwise patient is stable. -Will switch neb treatments to prn -PPD negative -We truly appreciate ID's consult and help with caring for this patient. - [name], whose phone number is listed in patient's room if needed.  2. Acute renal insufficiency- may be due to dehydration +/- NSAID use, but again concerning for pulmonary-renal cause of illness or HIVAN. UA significant for spec grav at 1.040, 100 of protein, and red cells on admission.  - Creatinine continues to improve.   - IVF @ 58 cc's/hr, patient has good urine output and normotensive.  Will continue for now pending appetite improvement/fluid intake.    3. Anemia- HgB improved to 8.5 this morning.No signs of active bleeding. - Discussed his HgB with patient yesterday. States he is not having frequent stools. - Denies dark/bloody bowel movement.  - Continue Iron - Continue to monitor. - Transfusion threshold is less than 6.0  4. FEN/GI- patient appears malnourished, albumin low at 2.1. Will allow regular diet, give zofran prn, and protonix for GI prophylaxis. NS decrease to 75 cc/hr. States nothing tastes good to him. - Continue Remeron to help with appetite.  - Encourage patient to eat whatever he wants. Continue to monitor I/O and daily weights.  5. DVT ppx- SQ heparin   6. Disposition- Pending further clinical improvement. Patient is stable at this time, will consider transfer back to floor. Continue to follow ID recommendations.  [name], Candace Begue Pager: [phone] 05/04/2011, 9:03 AM

## 2011-05-04 NOTE — Progress Notes (Signed)
Patient admitted to unit 6700 from unit 2600. A/O, VSS, denies discomfort. Daniel House [facility]

## 2011-05-04 NOTE — Progress Notes (Signed)
FMTS Attending Note Patient seen and examined by me today, I agree with Dr Algis Downs assessment and plan. Patient to be transferred to telemetry floor today.  PT/OT and out of bed as tolerated.  WIll defer to ID regarding need for further workup and management of the positive RPR.  Paula Compton, MD

## 2011-05-05 ENCOUNTER — Other Ambulatory Visit: Payer: Self-pay | Admitting: Licensed Clinical Social Worker

## 2011-05-05 ENCOUNTER — Telehealth: Payer: Self-pay | Admitting: Infectious Disease

## 2011-05-05 ENCOUNTER — Telehealth: Payer: Self-pay | Admitting: Licensed Clinical Social Worker

## 2011-05-05 DIAGNOSIS — B59 Pneumocystosis: Secondary | ICD-10-CM

## 2011-05-05 DIAGNOSIS — B20 Human immunodeficiency virus [HIV] disease: Secondary | ICD-10-CM

## 2011-05-05 LAB — EXPECTORATED SPUTUM ASSESSMENT W GRAM STAIN, RFLX TO RESP C

## 2011-05-05 LAB — HEPATITIS PANEL, ACUTE: Hep A IgM: NEGATIVE

## 2011-05-05 MED ORDER — EMTRICITABINE-TENOFOVIR DF 200-300 MG PO TABS: 1.0000 | ORAL_TABLET | Freq: Every day | ORAL | Status: DC

## 2011-05-05 MED ORDER — RITONAVIR 100 MG PO TABS: 100.0000 mg | ORAL_TABLET | Freq: Every day | ORAL | Status: DC

## 2011-05-05 MED ORDER — PREDNISONE 20 MG PO TABS: ORAL_TABLET | ORAL | Status: DC

## 2011-05-05 MED ORDER — AZITHROMYCIN 600 MG PO TABS: 1200.0000 mg | ORAL_TABLET | ORAL | Status: DC

## 2011-05-05 MED ORDER — DARUNAVIR ETHANOLATE 800 MG PO TABS: 800.0000 mg | ORAL_TABLET | Freq: Every day | ORAL | Status: DC

## 2011-05-05 MED ORDER — SULFAMETHOXAZOLE-TMP DS 800-160 MG PO TABS
2.0000 | ORAL_TABLET | Freq: Three times a day (TID) | ORAL | Status: DC
  Administered 2011-05-05 – 2011-05-15 (×31): 2 via ORAL
  Filled 2011-05-05 (×34): qty 2

## 2011-05-05 MED ORDER — PREDNISONE 20 MG PO TABS
40.0000 mg | ORAL_TABLET | Freq: Two times a day (BID) | ORAL | Status: DC
  Administered 2011-05-05 – 2011-05-09 (×8): 40 mg via ORAL
  Filled 2011-05-05 (×11): qty 2

## 2011-05-05 MED ORDER — SULFAMETHOXAZOLE-TRIMETHOPRIM 800-160 MG PO TABS: 2.0000 | ORAL_TABLET | Freq: Three times a day (TID) | ORAL | Status: DC

## 2011-05-05 MED ORDER — SALINE SPRAY 0.65 % NA SOLN
1.0000 | NASAL | Status: DC | PRN
  Administered 2011-05-05: 1 via NASAL
  Filled 2011-05-05: qty 44

## 2011-05-05 MED ORDER — POLYETHYLENE GLYCOL 3350 17 G PO PACK
17.0000 g | PACK | Freq: Every day | ORAL | Status: DC | PRN
  Filled 2011-05-05: qty 1

## 2011-05-05 MED ORDER — PENICILLIN G BENZATHINE 1200000 UNIT/2ML IM SUSP
2.4000 10*6.[IU] | INTRAMUSCULAR | Status: DC
  Administered 2011-05-05 – 2011-05-12 (×2): 2.4 10*6.[IU] via INTRAMUSCULAR
  Filled 2011-05-05 (×3): qty 4

## 2011-05-05 NOTE — Progress Notes (Signed)
Clinical Social Worker received a call from pt sister Daniel House this morning. CSW spoke with pt first and received consent for CSW to contact Daniel House [phone]. Pt asked CSW to remove other sibling from the emergency contact as Daniel House is the only one that is aware of his diagnosis. CSW contacted and confirmed with the ID bridge counselor that she has already made contact with pt and pt is to call her when he dc's. The bridge counselor is Daniel House [phone], pt is aware and has her contact information.   CSW contact pt sister Daniel House and informed her of the support pt will have at dc. Sister stated she was appreciative of CSW assistance and would contact her if she had additional questions.  Daniel House, MSW, Daniel House [phone]

## 2011-05-05 NOTE — Telephone Encounter (Signed)
I did not see this on his chart, can let me know what dosage and I will send it to [facility]?

## 2011-05-05 NOTE — Progress Notes (Signed)
Nutrition Follow-up  Refusing Resource Breeze. Intake of meals slowly improving. Pt ate almost 100% of breakfast. Currently too tired to eat lunch, per pt. Encouraged him to consume as much as he can at meal times.  Diet Order:  Regular Supplements: Resource Breeze PO BID  Meds: Scheduled Meds:   . ipratropium  0.5 mg Nebulization TID   And  . albuterol  2.5 mg Nebulization TID  . antiseptic oral rinse  15 mL Mouth Rinse BID  . darunavir  800 mg Oral Q breakfast  . docusate sodium  100 mg Oral BID  . emtricitabine-tenofovir  1 tablet Oral Daily  . feeding supplement  1 Container Oral TID BM  . ferrous sulfate  325 mg Oral TID WC  . heparin  5,000 Units Subcutaneous Q8H  . mirtazapine  15 mg Oral QHS  . penicillin g benzathine (BICILLIN-LA) IM  2.4 Million Units Intramuscular Weekly  . predniSONE  40 mg Oral BID WC  . ritonavir  100 mg Oral Daily  . sodium chloride  3 mL Intravenous Q12H  . sulfamethoxazole-trimethoprim  2 tablet Oral Q8H  . white petrolatum      . DISCONTD: methylPREDNISolone (SOLU-MEDROL) injection  125 mg Intravenous Q6H  . DISCONTD: sulfamethoxazole-trimethoprim  255 mg Intravenous Q8H   Continuous Infusions:   . sodium chloride 75 mL/hr at 05/04/11 0700   PRN Meds:.acetaminophen, acetaminophen, albuterol, albuterol, alum & mag hydroxide-simeth, benzonatate, ondansetron (ZOFRAN) IV, ondansetron, oxyCODONE, polyethylene glycol, sodium chloride, zolpidem  Labs:  CMP     Component Value Date/Time   NA 130* 05/04/2011 0515   K 4.9 05/04/2011 0515   CL 103 05/04/2011 0515   CO2 19 05/04/2011 0515   GLUCOSE 99 05/04/2011 0515   BUN 24* 05/04/2011 0515   CREATININE 1.11 05/04/2011 0515   CALCIUM 8.6 05/04/2011 0515   PROT 6.1 05/03/2011 0500   ALBUMIN 1.8* 05/03/2011 0500   AST 54* 05/03/2011 0500   ALT 17 05/03/2011 0500   ALKPHOS 125* 05/03/2011 0500   BILITOT 0.2* 05/03/2011 0500   GFRNONAA 84* 05/04/2011 0515   GFRAA >90 05/04/2011 0515     Intake/Output  Summary (Last 24 hours) at 05/05/11 1252 Last data filed at 05/05/11 1200  Gross per 24 hour  Intake 2710.94 ml  Output   1125 ml  Net 1585.94 ml   Ht: 6' (182.9 cm)  Weight Status:  49.9 kg Weights fluctuating between 47 - 56 kg  Estimated needs:  1500 - 1700 kcal, 70 - 80 grams protein  Nutrition Dx:  Inadequate oral intake r/t poor appetite AEB pt report.  Goal:  Meet >/=90% of estimated nutrition needs to promote energy and protein repletion.  Intervention:   1. Discontinue Resource Breeze - pt refusing 2. If intake does not continue to improve and more aggressive nutrition support desired, recommend placing enteral feeding tube and initiating Osmolite 1.2 at 20 ml/hr, advance 10 ml/hr every 8 hours (or as tolerated) to goal of 60 ml/hr. This will provide: 1728 kcal, 80 grams protein, 1180 ml free water. Pt may be at risk for refeeding syndrome given dx of severe malnutrition. Recommend monitoring potassium, phosphorus, and magnesium. Replete prn. 3. RD to continue to follow  Monitor:  PO intake, weights, labs  Adair Laundry Pager #:  [phone]

## 2011-05-05 NOTE — Evaluation (Signed)
Occupational Therapy Evaluation Patient Details Name: Daniel House MRN: [id number] DOB: September 13, 1975 Today's Date: 05/05/2011  Problem List:  Patient Active Problem List  Diagnoses  . Hypoxia  . Acute respiratory failure with hypoxia  . HIV disease  . Pulmonary infiltrates    Past Medical History: History reviewed. No pertinent past medical history. Past Surgical History:  Past Surgical History  Procedure Date  . Appendectomy     OT Assessment/Plan/Recommendation OT Assessment Clinical Impression Statement: This 36 yo male admitted with shortness of breath and found to have probable PNA (PCP) as well as new diagnosis of HIV presents to acute OT with primary limiting factor decreased O2 sats with any activity requiring increased time to regain O2 levels > than 92 ( had to turn O2 up). Will benefit from acute OT as well as follow-up OT at SNF.  OT Recommendation/Assessment: Patient will need skilled OT in the acute care venue OT Problem List: Decreased strength;Decreased activity tolerance;Cardiopulmonary status limiting activity Barriers to Discharge: Decreased caregiver support OT Therapy Diagnosis : Generalized weakness OT Plan OT Frequency: Min 2X/week OT Treatment/Interventions: Self-care/ADL training;DME and/or AE instruction;Therapeutic activities;Energy conservation;Patient/family education;Balance training OT Recommendation Follow Up Recommendations: Skilled nursing facility Equipment Recommended: Defer to next venue Individuals Consulted Consulted and Agree with Results and Recommendations: Patient OT Goals Acute Rehab OT Goals OT Goal Formulation: With patient Time For Goal Achievement: 2 weeks ADL Goals Pt Will Perform Grooming: with set-up;with supervision;Supported;Sitting, chair (2 tasks in less than 5 minutes with <= 3 rest breaks) ADL Goal: Grooming - Progress: Goal set today Pt Will Perform Upper Body Bathing: with set-up;with supervision;Supported;Sitting,  chair (2 tasks in less than 5 minutes with <= 3 rest breaks) ADL Goal: Upper Body Bathing - Progress: Goal set today Pt Will Perform Lower Body Bathing: with set-up;with supervision;Supported;Sit to stand from chair (2 tasks in less than 5 minutes with <= 3 rest breaks) ADL Goal: Lower Body Bathing - Progress: Goal set today Miscellaneous OT Goals Miscellaneous OT Goal #1: Pt wil independently use purse lipped breathing during other goal activities. OT Goal: Miscellaneous Goal #1 - Progress: Goal set today  OT Evaluation Precautions/Restrictions  Precautions Precautions: Fall Restrictions Weight Bearing Restrictions: No Prior Functioning Home Living Lives With:  (1 other adult and one child) Available Help at Discharge: Family;Friend(s) (definitely not in mornings) Type of Home: House Home Access: Level entry Home Layout: Two level Alternate Level Stairs-Number of Steps: 12 Alternate Level Stairs-Rails: Left (going up) Bathroom Shower/Tub: Tub/shower unit;Curtain Bathroom Toilet: Standard Bathroom Accessibility: No Home Adaptive Equipment: None (sink on right of toilet) Prior Function Level of Independence: Independent (prior to last 3-4 weeks) Able to Take Stairs?: No Driving: No Vocation: Unemployed  ADL ADL Eating/Feeding: Simulated;Independent Where Assessed - Eating/Feeding: Bed level Grooming: Simulated;Set up Where Assessed - Grooming: Supine, head of bed up;Supported Upper Body Bathing: Simulated;Supervision/safety;Set up;Other (comment) Upper Body Bathing Details (indicate cue type and reason): Pt can do this; however any exertion causes sats to decrease and pt does not recover quickly even with pursed lipped breathing; had to turn up O2 from 5 to 7 for about 1 minute. Where Assessed - Upper Body Bathing: Unsupported;Sitting, bed Lower Body Bathing: Simulated;Supervision/safety;Set up Lower Body Bathing Details (indicate cue type and reason): Pt can do this;  however any exertion causes sats to decrease and pt does not recover quickly even with pursed lipped breathing; had to turn up O2 from 5 to 7 for about 1 minute. Where Assessed - Lower Body Bathing: Supported;Sit  to stand from bed Upper Body Dressing: Simulated;Supervision/safety;Set up Upper Body Dressing Details (indicate cue type and reason): Pt can do this; however any exertion causes sats to decrease and pt does not recover quickly even with pursed lipped breathing; had to turn up O2 from 5 to 7 for about 1 minute. Where Assessed - Upper Body Dressing: Unsupported;Sitting, bed Lower Body Dressing: Performed;Supervision/safety;Set up (don socks) Lower Body Dressing Details (indicate cue type and reason): Pt can do this; however any exertion causes sats to decrease and pt does not recover quickly even with pursed lipped breathing; had to turn up O2 from 5 to 7 for about 1 minute. Where Assessed - Lower Body Dressing: Supported;Sit to stand from bed Toilet Transfer: Simulated;Minimal assistance Toilet Transfer Details (indicate cue type and reason): Bed to chair right beside bed Toilet Transfer Method: Stand pivot Toileting - Clothing Manipulation: Simulated;Supervision/safety Toileting - Clothing Manipulation Details (indicate cue type and reason): Pt can do this; however any exertion causes sats to decrease and pt does not recover quickly even with pursed lipped breathing; had to turn up O2 from 5 to 7 for about 1 minute. Where Assessed - Toileting Clothing Manipulation: Standing Toileting - Hygiene: Simulated;Supervision/safety Toileting - Hygiene Details (indicate cue type and reason): Pt can do this; however any exertion causes sats to decrease and pt does not recover quickly even with pursed lipped breathing; had to turn up O2 from 5 to 7 for about 1 minute. Where Assessed - Toileting Hygiene: Sit on 3-in-1 or toilet Tub/Shower Transfer: Not assessed Tub/Shower Transfer Method: Not  assessed Equipment Used:  (None--only up to chair) Ambulation Related to ADLs: Unable due to drop in sats with activity ADL Comments: Pt educated on pursed lipped breathing and RN to check on getting order for inspirometer. Vision/Perception  Vision - History Baseline Vision: No visual deficits Cognition Cognition Arousal/Alertness: Awake/alert Overall Cognitive Status: Appears within functional limits for tasks assessed Orientation Level: Oriented X4 Sensation/Coordination   Extremity Assessment RUE Assessment RUE Assessment: Within Functional Limits LUE Assessment LUE Assessment: Within Functional Limits Mobility  Bed Mobility Bed Mobility: Yes Supine to Sit: 5: Supervision;HOB elevated (Comment degrees) (30 degrees) Sitting - Scoot to Edge of Bed: 7: Independent Transfers Transfers: Yes Sit to Stand: 4: Min assist;With upper extremity assist;From bed Stand to Sit: 4: Min assist;With upper extremity assist;With armrests;To chair/3-in-1 Exercises   End of Session OT - End of Session Activity Tolerance:  (limited by drop in sats with activity) Patient left: in chair;with call bell in reach Nurse Communication: Mobility status for transfers (drop in sats with activity) General Behavior During Session: [facility] for tasks performed Cognition: [facility] for tasks performed   Evette Georges [phone] 05/05/2011, 1:19 PM

## 2011-05-05 NOTE — Progress Notes (Signed)
Patient ID: Daniel House, male   DOB: 10/12/1975, 36 y.o.   MRN: [id number]  Daily Progress Note Family Medicine Teaching Service Service Pager: [phone]  Subjective:  Transferred from SDU yesterday. On [place] in last 24 hours; did not require venturi mask. Appetite somewhat improved. States his nose is dry from O2. He has sat up on side of bed, but has not ambulated.   I have reviewed the patient's medications.  Objective Temp:  [97.6 F (36.4 C)-98.2 F (36.8 C)] 97.7 F (36.5 C) (04/15 0534) Pulse Rate:  [81-91] 87  (04/15 0534) Resp:  [19-22] 19  (04/15 0534) BP: (112-116)/(75-83) 116/76 mmHg (04/15 0534) SpO2:  [96 %-100 %] 100 % (04/15 0534) Weight:  [110 lb (49.896 kg)] 110 lb (49.896 kg) (04/14 2145)   Intake/Output Summary (Last 24 hours) at 05/05/11 0843 Last data filed at 05/05/11 0700  Gross per 24 hour  Intake 2830.94 ml  Output   1325 ml  Net 1505.94 ml   General: awake, alert, lying flat in bed, appears comfortable. [place] in place HEENT: AT/[place], sclera white, MMM Lymph: no cervical or supraclavicular LAD CV: RRR, no murmurs Pulm: CTAB  Abd: firm abdomen but not tense (likely secondary to body habitus) +BS, NTND Ext: no edema, warm.  Neuro: alert, oriented  Labs and Imaging  Lab 05/04/11 0515 05/03/11 0500 05/02/11 0435  WBC 6.7 6.3 6.2  HGB 8.5* 7.8* 7.8*  HCT 24.8* 23.6* 23.6*  PLT 414* 441* 433*    Lab 05/04/11 0515 05/03/11 0500 05/02/11 1000 05/02/11 0435 05/01/11 0430 04/30/11 0500  NA 130* 132* -- 130* -- --  K 4.9 4.8 5.4* -- -- --  CL 103 102 -- 104 -- --  CO2 19 20 -- 18* -- --  BUN 24* 25* -- 28* -- --  CREATININE 1.11 1.13 -- 1.18 -- --  CALCIUM 8.6 8.6 -- 8.6 -- --  PROT -- 6.1 -- -- 6.1 6.5  BILITOT -- 0.2* -- -- <0.1* 0.1*  ALKPHOS -- 125* -- -- 120* 130*  ALT -- 17 -- -- 9 10  AST -- 54* -- -- 52* 57*  GLUCOSE 99 101* -- 96 -- --   TSH 0.792 ACE 38 CD4 20 HIV Quant 837,756 LDH 715 Crytpo Neg RPR+, Titer 1:16   Assessment and  Plan 36 year old Male with no significant PMHx who presents with 1 week of dyspnea, found to be tachycardic and hypoxic in ER, imagining concerning for interstitial lung disease:   1. Pulmonary - Imaging and clinical picture concerning for PCP pneumonia, or atypical infection. Transferred to SDU on 04/30/11 due to worsening respiratory status. Respiratory status seems stable, but when ambulating yesterday still required venturi mask to keep oxygen sats up. Transferred back to tele floor on 05/04/11.  -HIV positive. CD4 count 20. HIV viral load 837K. - ID consulted. We appreciate their help with the care of this patient, as well as arranging for patient to receive HIV meds. - RPR came back last night reactive, with titer of 1:16.  Confirmation test sent, which should be available this afertoon.  There is an increased incidence of false + RPR in HIV patients, will defer to ID to decide if/when to treat while awaiting confirmation test.  -Continue on Bactrim IV 15 mg/kg of TMP divided q8 hours on 04/29/11. This is day #8 of antibiotics. Unsure how long patient should be on high dose IV antibiotics. Will await ID recs. -Continue Solumedrol (day #7) Consider transitioning to PO steroids -  Antiretroviral therapy per ID - Continue O2 via [place], but use mask as needed to maintain sats. Stable on [place] on floor overnight. Will start humidified air, as well as saline spray as needed - Will switch neb treatments to prn - PPD negative -We truly appreciate ID's consult and help with caring for this patient. - [name], whose phone number is listed in patient's room if needed.  2. Acute renal insufficiency- Noted on admission; may be due to dehydration +/- NSAID use, but again concerning for pulmonary-renal cause of illness or HIVAN. UA significant for spec grav at 1.040, 100 of protein, and red cells on admission. Resolved. - Creatinine continues to improve.   - IVF @ 42 cc's/hr,  patient has good urine output and normotensive.  Will continue for now pending appetite improvement/fluid intake.   3. Anemia- HgB improved. No signs of active bleeding. Will recheck CBC tomorrow. - Discussed his HgB with patient . States he is not having frequent stools. - Denies dark/bloody bowel movement.  - Continue Iron - Continue to monitor. - Transfusion threshold is less than 6.0  4. FEN/GI- patient appears malnourished, albumin low at 2.1. Will allow regular diet, give zofran prn, and protonix for GI prophylaxis. NS decrease to 75 cc/hr. Miralax as needed for constipation. - Continue Remeron to help with appetite.  - Encourage patient to eat whatever he wants. Continue to monitor I/O and daily weights.  5. DVT ppx- SQ heparin   6. Disposition- Pending further clinical improvement. Will get PT/OT evaluations today Continue to follow ID recommendations.  Daniel House, Daniel House Pager: [phone] 05/05/2011, 8:43 AM

## 2011-05-05 NOTE — Telephone Encounter (Signed)
The ARV regimen is: prezista 800mg  norvir 100mg  truvada daily  He is also getting bactrim DS two tablets three times daily for 21 day course He should be on prednisone 40mg  bid for 5 days, then 40 daily for 5 days then 20 daily for 11 days  He also should get azithromycin 1200mg  weekly

## 2011-05-05 NOTE — Progress Notes (Signed)
Physical Therapy Evaluation Patient Details Name: Daniel House MRN: [id number] DOB: 1975/05/05 Today's Date: 05/05/2011  Problem List:  Patient Active Problem List  Diagnoses  . Hypoxia  . Acute respiratory failure with hypoxia  . HIV disease  . Pulmonary infiltrates    Past Medical History: History reviewed. No pertinent past medical history. Past Surgical History:  Past Surgical History  Procedure Date  . Appendectomy     PT Assessment/Plan/Recommendation PT Assessment Clinical Impression Statement:  36 yo male admitted with hypoxia, pneumonia, and with newly diagnosed HIV presents with cardiopulmonary status significantly limiting functional mobility;  Session conducted on supplemental O2 5 LPM via [place], and O2 sats decreased as low as 81% (observed lowest); titrated O2 up to 7 LPM and sats increased to high 80s/low 90s; Ended session with pt on O2 5.5 LPM in chair and O2 sats 92%; RN aware;  Will benefit from PT in acute care and at SNF post dc from hospital to maximize independence and safety with mobility, amb, activity tolerance PT Recommendation/Assessment: Patient will need skilled PT in the acute care venue PT Problem List: Decreased activity tolerance;Decreased mobility;Decreased knowledge of precautions;Cardiopulmonary status limiting activity;Pain PT Therapy Diagnosis :  (Cardiopulmonary status limiting functional mobility) PT Plan PT Frequency: Min 3X/week PT Treatment/Interventions: DME instruction;Gait training;Functional mobility training;Therapeutic activities;Therapeutic exercise;Patient/family education PT Recommendation Follow Up Recommendations: Skilled nursing facility Equipment Recommended: Defer to next venue PT Goals  Acute Rehab PT Goals PT Goal Formulation: With patient Time For Goal Achievement: 2 weeks Pt will go Supine/Side to Sit: with modified independence (with O2 sats stable) PT Goal: Supine/Side to Sit - Progress: Goal set today Pt will Sit at  [facility] of Bed: with modified independence (with O2 sats stable) PT Goal: Sit at [facility] Of Bed - Progress: Goal set today Pt will go Sit to Stand: with modified independence (with O2 sats stable) PT Goal: Sit to Stand - Progress: Goal set today Pt will go Stand to Sit: with modified independence (with O2 sats stable) PT Goal: Stand to Sit - Progress: Goal set today Pt will Transfer Bed to Chair/Chair to Bed: with modified independence (with O2 sats stable) PT Transfer Goal: Bed to Chair/Chair to Bed - Progress: Goal set today Pt will Ambulate: 51 - 150 feet;with supervision;with rolling walker (with O2 sats stable) PT Goal: Ambulate - Progress: Goal set today  PT Evaluation Precautions/Restrictions  Precautions Precautions: Fall Restrictions Other Position/Activity Restrictions: Desats considerably with activity, even with supplemental O2 Prior Functioning  Home Living Lives With: Family Available Help at Discharge: Family;Friend(s) Type of Home: House Home Access: Level entry Home Layout: Two level Alternate Level Stairs-Number of Steps: 12 Alternate Level Stairs-Rails: Left Bathroom Shower/Tub: Tub/shower unit;Curtain Bathroom Toilet: Standard Bathroom Accessibility: No Home Adaptive Equipment: None Additional Comments: Is alone for most of the morning Prior Function Level of Independence: Independent Able to Take Stairs?: No Driving: No Vocation: Unemployed Financial risk analyst Arousal/Alertness: Awake/alert Overall Cognitive Status: Appears within functional limits for tasks assessed Orientation Level: Oriented X4 Extremity/Trunk Assessment   Mobility (including Balance) Bed Mobility Bed Mobility: Yes Supine to Sit: 5: Supervision;HOB elevated (Comment degrees) Supine to Sit Details (indicate cue type and reason): Cues to initiate; cues to self monitor; Stopped to work on controlled breathing, pursed-lip breathing sitting EOB Sitting - Scoot to [place] of Bed: 7:  Independent Transfers Transfers: Yes Sit to Stand: 4: Min assist;With upper extremity assist;From bed Sit to Stand Details (indicate cue type and reason): cues for safety, self-monitor Stand to Sit:  4: Min assist;With upper extremity assist;With armrests;To chair/3-in-1 Stand to Sit Details: Cues to control descent Ambulation/Gait Ambulation/Gait: Yes Ambulation/Gait Assistance: 4: Min assist Ambulation/Gait Assistance Details (indicate cue type and reason): Pivot steps bed to chair; cues to self-monitor for activity tol Ambulation Distance (Feet): 1 Feet Assistive device: None       End of Session PT - End of Session Equipment Utilized During Treatment:  (Supplemental O2) Activity Tolerance: Treatment limited secondary to medical complications (Comment) (significant desatting with activity ) Patient left: in chair;with call bell in reach Nurse Communication: Mobility status for transfers General Behavior During Session: [facility] for tasks performed Cognition: [facility] for tasks performed  Van Clines [facility] [place], Petronila [phone]  05/05/2011, 4:59 PM

## 2011-05-05 NOTE — Telephone Encounter (Signed)
I called them in to [facility].

## 2011-05-05 NOTE — Progress Notes (Signed)
Subjective: Pt still with significant DOE and still on 5L via [place] at rest  Antibiotics:  Anti-infectives     Start     Dose/Rate Route Frequency Ordered Stop   05/03/11 1100   Darunavir Ethanolate (PREZISTA) tablet 800 mg        800 mg Oral Daily with breakfast 05/03/11 0801     05/02/11 1000   Darunavir Ethanolate (PREZISTA) tablet 800 mg  Status:  Discontinued        800 mg Oral Daily 05/02/11 0928 05/03/11 0801   05/02/11 1000   ritonavir (NORVIR) tablet 100 mg        100 mg Oral Daily 05/02/11 0928     05/02/11 1000   emtricitabine-tenofovir (TRUVADA) 200-300 MG per tablet 1 tablet        1 tablet Oral Daily 05/02/11 0928     05/01/11 2000  sulfamethoxazole-trimethoprim (BACTRIM) 255 mg in dextrose 5 % 250 mL IVPB       255 mg 265.9 mL/hr over 60 Minutes Intravenous Every 8 hours 05/01/11 1908     04/30/11 1600   azithromycin (ZITHROMAX) 500 mg in dextrose 5 % 250 mL IVPB  Status:  Discontinued        500 mg 250 mL/hr over 60 Minutes Intravenous Every 24 hours 04/30/11 1455 05/02/11 1044   04/30/11 1600   ceFEPIme (MAXIPIME) 1 g in dextrose 5 % 50 mL IVPB  Status:  Discontinued        1 g 100 mL/hr over 30 Minutes Intravenous Every 12 hours 04/30/11 1455 05/02/11 1044   04/29/11 1100   sulfamethoxazole-trimethoprim (BACTRIM) 255 mg in dextrose 5 % 250 mL IVPB  Status:  Discontinued        255 mg 265.9 mL/hr over 60 Minutes Intravenous Every 8 hours 04/29/11 1008 05/01/11 1909   04/28/11 1600   sulfamethoxazole-trimethoprim (BACTRIM DS) 800-160 MG per tablet 2 tablet  Status:  Discontinued        2 tablet Oral 3 times daily 04/28/11 1348 04/29/11 1008   04/28/11 1330   sulfamethoxazole-trimethoprim (BACTRIM,SEPTRA) 400-80 MG per tablet 2.5 tablet  Status:  Discontinued        2.5 tablet Oral 4 times per day 04/28/11 1224 04/28/11 1348   04/28/11 1100   sulfamethoxazole-trimethoprim (BACTRIM DS) 800-160 MG per tablet 1 tablet  Status:  Discontinued        1 tablet Oral  Daily 04/28/11 1046 04/28/11 1224   04/27/11 2345   cefTRIAXone (ROCEPHIN) 1 g in dextrose 5 % 50 mL IVPB        1 g 100 mL/hr over 30 Minutes Intravenous  Once 04/27/11 2338 04/28/11 0028   04/27/11 2345   azithromycin (ZITHROMAX) 500 mg in dextrose 5 % 250 mL IVPB        500 mg 250 mL/hr over 60 Minutes Intravenous  Once 04/27/11 2338 04/28/11 0128          Medications: Scheduled Meds:    . ipratropium  0.5 mg Nebulization TID   And  . albuterol  2.5 mg Nebulization TID  . antiseptic oral rinse  15 mL Mouth Rinse BID  . darunavir  800 mg Oral Q breakfast  . docusate sodium  100 mg Oral BID  . emtricitabine-tenofovir  1 tablet Oral Daily  . feeding supplement  1 Container Oral TID BM  . ferrous sulfate  325 mg Oral TID WC  . heparin  5,000 Units Subcutaneous Q8H  . methylPREDNISolone (SOLU-MEDROL) injection  125 mg Intravenous Q6H  . mirtazapine  15 mg Oral QHS  . ritonavir  100 mg Oral Daily  . sodium chloride  3 mL Intravenous Q12H  . sulfamethoxazole-trimethoprim  255 mg Intravenous Q8H  . white petrolatum       Continuous Infusions:    . sodium chloride 75 mL/hr at 05/04/11 0700   PRN Meds:.acetaminophen, acetaminophen, albuterol, albuterol, alum & mag hydroxide-simeth, benzonatate, ondansetron (ZOFRAN) IV, ondansetron, oxyCODONE, polyethylene glycol, sodium chloride, zolpidem   Objective: Weight change: -3.7 oz (-0.104 kg)  Intake/Output Summary (Last 24 hours) at 05/05/11 1215 Last data filed at 05/05/11 1200  Gross per 24 hour  Intake 2710.94 ml  Output   1125 ml  Net 1585.94 ml   Blood pressure 106/67, pulse 95, temperature 98.6 F (37 C), temperature source Oral, resp. rate 22, height 6' (1.829 m), weight 110 lb (49.896 kg), SpO2 92.00%. Temp:  [97.6 F (36.4 C)-98.6 F (37 C)] 98.6 F (37 C) (04/15 1000) Pulse Rate:  [81-103] 95  (04/15 1116) Resp:  [19-22] 22  (04/15 1000) BP: (106-116)/(67-83) 106/67 mmHg (04/15 1000) SpO2:  [83 %-100 %] 92 %  (04/15 1116) Weight:  [110 lb (49.896 kg)] 110 lb (49.896 kg) (04/14 2145)  Physical Exam: General: Alert and awake, oriented x3, a HEENT: anicteric sclera, pupils reactive to light and accommodation, EOMI CVS, normal r,  no murmur rubs or gallops Chest: wheezes and rhonchi esp posteriorly Abdomen: soft nontender, nondistended, normal bowel sounds, Extremities: no  clubbing or edema noted bilaterally Skin: no rashes  Neuro: nonfocal  Lab Results:  Basename 05/04/11 0515 05/03/11 0500  WBC 6.7 6.3  HGB 8.5* 7.8*  HCT 24.8* 23.6*  PLT 414* 441*    BMET  Basename 05/04/11 0515 05/03/11 0500  NA 130* 132*  K 4.9 4.8  CL 103 102  CO2 19 20  GLUCOSE 99 101*  BUN 24* 25*  CREATININE 1.11 1.13  CALCIUM 8.6 8.6    Micro Results: Recent Results (from the past 240 hour(s))  CULTURE, BLOOD (ROUTINE X 2)     Status: Normal   Collection Time   04/28/11 12:30 AM      Component Value Range Status Comment   Specimen Description BLOOD RIGHT ARM   Final    Special Requests BOTTLES DRAWN AEROBIC ONLY 5CC   Final    Culture  Setup Time [id number]   Final    Culture NO GROWTH 5 DAYS   Final    Report Status 05/04/2011 FINAL   Final   CULTURE, BLOOD (ROUTINE X 2)     Status: Normal   Collection Time   04/28/11 12:40 AM      Component Value Range Status Comment   Specimen Description BLOOD RIGHT WRIST   Final    Special Requests BOTTLES DRAWN AEROBIC ONLY [facility]   Final    Culture  Setup Time [id number]   Final    Culture NO GROWTH 5 DAYS   Final    Report Status 05/04/2011 FINAL   Final   MRSA PCR SCREENING     Status: Normal   Collection Time   04/28/11  4:14 AM      Component Value Range Status Comment   MRSA by PCR NEGATIVE  NEGATIVE  Final     Studies/Results: No results found.    Assessment/Plan: Daniel House is a 36 y.o. male with  Newly diagnosed HIV/AIDS, and PCP pneumonia:   1) PCP Pneumonia: He is slow to improve from resp  standpoint. Kennon Portela has premature COPD  underlying this PCP pneumonia --will change bactrim to high dose PO and change steroids to high dose prednisone  2) Syphilis; has had years ago. Possibly reinfected. Neurosyphilis could also be concern> Will give him IM PCN today and weekly x 3 doses and follow titers   3) HIV/AIDS:  Genotype pending.  --continue prezista/norvir and truvada -- per [employer] Poff  Pt close to approvaal for emergency ADAP, I put additional orders into epic for outpt world to get him meds at dc --azithromycin weekly for MAI prophylaxis  Disp: Pt is going to need help with placement> I anticipate him needing O2 in SNF vs with home health. Would be VERY careful with him given his tenuous resp status. WOuld not dc him until he is improving with [facility] LESS O2 requirements and with stable environement to get O2 if needed. His O2 requirements likely even higher with ambulation      LOS: 8 days   Acey Lav 05/05/2011, 12:15 PM

## 2011-05-05 NOTE — Telephone Encounter (Signed)
Trying to get [employer] uploaded so that ADAP can be processed to get him meds as an outpatient. He will also ned steroid taper

## 2011-05-05 NOTE — Progress Notes (Signed)
Family Medicine Teaching Service Attending Note  I interviewed and examined patient Daniel House and reviewed their tests and x-rays.  I discussed with Dr. Mikel Cella and reviewed their note for today.  I agree with their assessment and plan.     Additionally  Feeling tired but slowly improving Appreciate ID recommendations Continue Ph Therapy and anti-infectives

## 2011-05-06 LAB — CBC
HCT: 21.3 % — ABNORMAL LOW (ref 39.0–52.0)
Hemoglobin: 7.2 g/dL — ABNORMAL LOW (ref 13.0–17.0)
MCH: 29.9 pg (ref 26.0–34.0)
MCV: 88.4 fL (ref 78.0–100.0)
RBC: 2.41 MIL/uL — ABNORMAL LOW (ref 4.22–5.81)

## 2011-05-06 LAB — ANCA SCREEN W REFLEX TITER
Atypical p-ANCA Screen: NEGATIVE
c-ANCA Screen: NEGATIVE
p-ANCA Screen: NEGATIVE

## 2011-05-06 LAB — HLA B*5701: HLA B 5701: NEGATIVE

## 2011-05-06 LAB — BASIC METABOLIC PANEL
BUN: 24 mg/dL — ABNORMAL HIGH (ref 6–23)
CO2: 20 mEq/L (ref 19–32)
Calcium: 8.3 mg/dL — ABNORMAL LOW (ref 8.4–10.5)
Glucose, Bld: 147 mg/dL — ABNORMAL HIGH (ref 70–99)
Sodium: 134 mEq/L — ABNORMAL LOW (ref 135–145)

## 2011-05-06 LAB — GLUCOSE 6 PHOSPHATE DEHYDROGENASE
G6PDH: 18.3 U/g Hgb (ref 7.0–20.5)
Hemoglobin: 8.5

## 2011-05-06 MED ORDER — DIPHENHYDRAMINE HCL 25 MG PO CAPS: 25.0000 mg | ORAL_CAPSULE | Freq: Four times a day (QID) | ORAL | Status: DC | PRN

## 2011-05-06 MED ORDER — RITONAVIR 100 MG PO TABS
100.0000 mg | ORAL_TABLET | Freq: Every day | ORAL | Status: DC
  Administered 2011-05-07 – 2011-05-15 (×9): 100 mg via ORAL
  Filled 2011-05-06 (×11): qty 1

## 2011-05-06 NOTE — Progress Notes (Signed)
Patient ID: Daniel House, male   DOB: August 24, 1975, 36 y.o.   MRN: [id number]  Daily Progress Note Family Medicine Teaching Service Service Pager: [phone]  Subjective:  Patient required 6L O2 via [place] overnight. Continues to have SOB and desats with exertion. Eating breakfast and states he is overall feeling better. Discussed SNF with patient and states he thinks rehab is a good idea, but he does not feel he is ready to go yet.  I have reviewed the patient's medications.  Objective Temp:  [97.8 F (36.6 C)-98.7 F (37.1 C)] 97.8 F (36.6 C) (04/16 0442) Pulse Rate:  [79-103] 79  (04/16 0442) Resp:  [18-22] 18  (04/16 0442) BP: (103-112)/(63-75) 112/75 mmHg (04/16 0442) SpO2:  [83 %-97 %] 96 % (04/16 0735)   Intake/Output Summary (Last 24 hours) at 05/06/11 0909 Last data filed at 05/06/11 0800  Gross per 24 hour  Intake    480 ml  Output   1145 ml  Net   -665 ml   General: awake, alert, sitting up in bed eating cereal, appears comfortable. [place] in place HEENT: AT/[place], sclera white, MMM Lymph: no cervical or supraclavicular LAD CV: RRR, no murmurs Pulm: CTAB  Abd:  Soft, +BS, NTND Ext: no edema, warm.  Neuro: alert, oriented  Labs and Imaging  Lab 05/06/11 0500 05/04/11 0515 05/03/11 0500  WBC 6.1 6.7 6.3  HGB 7.2* 8.5* 7.8*  HCT 21.3* 24.8* 23.6*  PLT 387 414* 441*    Lab 05/06/11 0500 05/04/11 0515 05/03/11 0500 05/01/11 0430 04/30/11 0500  NA 134* 130* 132* -- --  K 4.3 4.9 4.8 -- --  CL 105 103 102 -- --  CO2 20 19 20  -- --  BUN 24* 24* 25* -- --  CREATININE 0.96 1.11 1.13 -- --  CALCIUM 8.3* 8.6 8.6 -- --  PROT -- -- 6.1 6.1 6.5  BILITOT -- -- 0.2* <0.1* 0.1*  ALKPHOS -- -- 125* 120* 130*  ALT -- -- 17 9 10   AST -- -- 54* 52* 57*  GLUCOSE 147* 99 101* -- --   TSH 0.792 ACE 38 CD4 20 HIV Quant 837,756 LDH 715 Crytpo Neg RPR+, Titer 1:16   Assessment and Plan 36 year old Male with no significant PMHx who presents with 1 week of dyspnea, found to be  tachycardic and hypoxic in ER, imagining concerning for interstitial lung disease:   1. Pulmonary - Imaging and clinical picture concerning for PCP pneumonia, or atypical infection. Transferred to SDU on 04/30/11 due to worsening respiratory status. Respiratory status seems stable, but when ambulating yesterday still required venturi mask to keep oxygen sats up. Transferred back to tele floor on 05/04/11.  - HIV positive. CD4 count 20. HIV viral load 837K. - ID consulted. We appreciate their help with the care of this patient, as well as arranging for patient to receive HIV meds. - RPR came back last night reactive, with titer of 1:16.  Confirmation test sent, with TP-PA >8.0.  There is an increased incidence of false + RPR in HIV patients, but given patient's history of syphilis, treated with Penicillin yesterday, and weekly for next 3 weeks.  - Patient has been on Bactrim IV 15 mg/kg of TMP divided q8 hours on started 04/29/11 after not improving on PO. This is day #9 of antibiotics. Transitioned back to PO Bactrim on 05/05/14. - Transitioned to Prednisone from Solumedrol (day #7) - Antiretroviral therapy per ID - Continue O2 via [place]. Stable on [place] on floor overnight. Will  start humidified air, as well as saline spray as needed - Will switch neb treatments to prn - PPD negative - We truly appreciate ID's consult and help with caring for this patient. - His point of contact is his sister Daniel House, whose phone number is listed in patient's room if needed.  2. Acute renal insufficiency- Noted on admission; may be due to dehydration +/- NSAID use, but again concerning for pulmonary-renal cause of illness or HIVAN. UA significant for spec grav at 1.040, 100 of protein, and red cells on admission. Resolved. - Creatinine continues to improve.   - IVF @ 62 cc's/hr, patient has good urine output and normotensive.  Will continue for now pending appetite improvement/fluid intake.   3. Anemia- HgB improved.  No signs of active bleeding. - Discussed his HgB with patient . States he is not having frequent stools. Denies dark/bloody bowel movement.  - Continue Iron - Continue to monitor. Repeat CBC tomorrow - Transfusion threshold is less than 6.0  4. FEN/GI- patient appears malnourished, albumin low at 2.1. Will allow regular diet, give zofran prn, and protonix for GI prophylaxis. NS decrease to 75 cc/hr. Miralax as needed for constipation. - Continue Remeron to help with appetite.  - Encourage patient to eat whatever he wants. Continue to monitor I/O and daily weights. - Nutrition following  5. DVT ppx- SQ heparin   6. Disposition- Pending further clinical improvement. Continue to follow ID recommendations. Final dispo will be to SNF for short term rehab. Patient is not stable yet. Continue PT/OT inpatient, and continue current medical treatments as described above.  [name], Caryl Manas Pager: [phone] 05/06/2011, 9:09 AM

## 2011-05-06 NOTE — Progress Notes (Signed)
Subjective: Pt still with need 5L at restt  Antibiotics:  Anti-infectives     Start     Dose/Rate Route Frequency Ordered Stop   05/07/11 0800   ritonavir (NORVIR) tablet 100 mg        100 mg Oral Daily with breakfast 05/06/11 1010     05/05/11 1400   penicillin g benzathine (BICILLIN LA) 1200000 UNIT/2ML injection 2.4 Million Units        2.4 Million Units Intramuscular Weekly 05/05/11 1224 05/26/11 1359   05/05/11 1400  sulfamethoxazole-trimethoprim (BACTRIM DS) 800-160 MG per tablet 2 tablet       2 tablet Oral 3 times per day 05/05/11 1225     05/03/11 1100   Darunavir Ethanolate (PREZISTA) tablet 800 mg        800 mg Oral Daily with breakfast 05/03/11 0801     05/02/11 1000   Darunavir Ethanolate (PREZISTA) tablet 800 mg  Status:  Discontinued        800 mg Oral Daily 05/02/11 0928 05/03/11 0801   05/02/11 1000   ritonavir (NORVIR) tablet 100 mg  Status:  Discontinued        100 mg Oral Daily 05/02/11 0928 05/06/11 1010   05/02/11 1000   emtricitabine-tenofovir (TRUVADA) 200-300 MG per tablet 1 tablet        1 tablet Oral Daily 05/02/11 0928     05/01/11 2000   sulfamethoxazole-trimethoprim (BACTRIM) 255 mg in dextrose 5 % 250 mL IVPB  Status:  Discontinued        255 mg 265.9 mL/hr over 60 Minutes Intravenous Every 8 hours 05/01/11 1908 05/05/11 1225   04/30/11 1600   azithromycin (ZITHROMAX) 500 mg in dextrose 5 % 250 mL IVPB  Status:  Discontinued        500 mg 250 mL/hr over 60 Minutes Intravenous Every 24 hours 04/30/11 1455 05/02/11 1044   04/30/11 1600   ceFEPIme (MAXIPIME) 1 g in dextrose 5 % 50 mL IVPB  Status:  Discontinued        1 g 100 mL/hr over 30 Minutes Intravenous Every 12 hours 04/30/11 1455 05/02/11 1044   04/29/11 1100   sulfamethoxazole-trimethoprim (BACTRIM) 255 mg in dextrose 5 % 250 mL IVPB  Status:  Discontinued        255 mg 265.9 mL/hr over 60 Minutes Intravenous Every 8 hours 04/29/11 1008 05/01/11 1909   04/28/11 1600    sulfamethoxazole-trimethoprim (BACTRIM DS) 800-160 MG per tablet 2 tablet  Status:  Discontinued        2 tablet Oral 3 times daily 04/28/11 1348 04/29/11 1008   04/28/11 1330   sulfamethoxazole-trimethoprim (BACTRIM,SEPTRA) 400-80 MG per tablet 2.5 tablet  Status:  Discontinued        2.5 tablet Oral 4 times per day 04/28/11 1224 04/28/11 1348   04/28/11 1100   sulfamethoxazole-trimethoprim (BACTRIM DS) 800-160 MG per tablet 1 tablet  Status:  Discontinued        1 tablet Oral Daily 04/28/11 1046 04/28/11 1224   04/27/11 2345   cefTRIAXone (ROCEPHIN) 1 g in dextrose 5 % 50 mL IVPB        1 g 100 mL/hr over 30 Minutes Intravenous  Once 04/27/11 2338 04/28/11 0028   04/27/11 2345   azithromycin (ZITHROMAX) 500 mg in dextrose 5 % 250 mL IVPB        500 mg 250 mL/hr over 60 Minutes Intravenous  Once 04/27/11 2338 04/28/11 0128  Medications: Scheduled Meds:    . ipratropium  0.5 mg Nebulization TID   And  . albuterol  2.5 mg Nebulization TID  . antiseptic oral rinse  15 mL Mouth Rinse BID  . darunavir  800 mg Oral Q breakfast  . docusate sodium  100 mg Oral BID  . emtricitabine-tenofovir  1 tablet Oral Daily  . ferrous sulfate  325 mg Oral TID WC  . heparin  5,000 Units Subcutaneous Q8H  . mirtazapine  15 mg Oral QHS  . penicillin g benzathine (BICILLIN-LA) IM  2.4 Million Units Intramuscular Weekly  . predniSONE  40 mg Oral BID WC  . ritonavir  100 mg Oral Q breakfast  . sodium chloride  3 mL Intravenous Q12H  . sulfamethoxazole-trimethoprim  2 tablet Oral Q8H  . DISCONTD: ritonavir  100 mg Oral Daily   Continuous Infusions:    . sodium chloride 75 mL/hr at 05/04/11 0700   PRN Meds:.acetaminophen, acetaminophen, albuterol, albuterol, alum & mag hydroxide-simeth, benzonatate, diphenhydrAMINE, ondansetron (ZOFRAN) IV, ondansetron, oxyCODONE, polyethylene glycol, sodium chloride, zolpidem   Objective: Weight change:   Intake/Output Summary (Last 24 hours) at  05/06/11 1610 Last data filed at 05/06/11 1300  Gross per 24 hour  Intake    600 ml  Output    921 ml  Net   -321 ml   Blood pressure 110/70, pulse 83, temperature 97.8 F (36.6 C), temperature source Oral, resp. rate 18, height 6' (1.829 m), weight 110 lb (49.896 kg), SpO2 93.00%. Temp:  [97.8 F (36.6 C)-98.7 F (37.1 C)] 97.8 F (36.6 C) (04/16 1400) Pulse Rate:  [79-96] 83  (04/16 1400) Resp:  [18-22] 18  (04/16 1400) BP: (103-113)/(63-75) 110/70 mmHg (04/16 1400) SpO2:  [91 %-97 %] 93 % (04/16 1514)  Physical Exam: General: Alert and awake, oriented x3, a HEENT: anicteric sclera, pupils reactive to light and accommodation, EOMI CVS, normal r,  no murmur rubs or gallops Chest: wheezes and rhonchi esp posteriorly Abdomen: soft nontender, nondistended, normal bowel sounds, Extremities: no  clubbing or edema noted bilaterally Skin: no rashes  Neuro: nonfocal  Lab Results:  Basename 05/06/11 0500 05/04/11 0515  WBC 6.1 6.7  HGB 7.2* 8.5*  HCT 21.3* 24.8*  PLT 387 414*    BMET  Basename 05/06/11 0500 05/04/11 0515  NA 134* 130*  K 4.3 4.9  CL 105 103  CO2 20 19  GLUCOSE 147* 99  BUN 24* 24*  CREATININE 0.96 1.11  CALCIUM 8.3* 8.6    Micro Results: Recent Results (from the past 240 hour(s))  CULTURE, BLOOD (ROUTINE X 2)     Status: Normal   Collection Time   04/28/11 12:30 AM      Component Value Range Status Comment   Specimen Description BLOOD RIGHT ARM   Final    Special Requests BOTTLES DRAWN AEROBIC ONLY 5CC   Final    Culture  Setup Time [id number]   Final    Culture NO GROWTH 5 DAYS   Final    Report Status 05/04/2011 FINAL   Final   CULTURE, BLOOD (ROUTINE X 2)     Status: Normal   Collection Time   04/28/11 12:40 AM      Component Value Range Status Comment   Specimen Description BLOOD RIGHT WRIST   Final    Special Requests BOTTLES DRAWN AEROBIC ONLY [facility]   Final    Culture  Setup Time [id number]   Final    Culture NO GROWTH 5 DAYS  Final      Report Status 05/04/2011 FINAL   Final   MRSA PCR SCREENING     Status: Normal   Collection Time   04/28/11  4:14 AM      Component Value Range Status Comment   MRSA by PCR NEGATIVE  NEGATIVE  Final   CULTURE, SPUTUM-ASSESSMENT     Status: Normal   Collection Time   05/05/11 12:05 PM      Component Value Range Status Comment   Specimen Description SPUTUM   Final    Special Requests NONE   Final    Sputum evaluation     Final    Value: THIS SPECIMEN IS ACCEPTABLE. RESPIRATORY CULTURE REPORT TO FOLLOW.   Report Status 05/05/2011 FINAL   Final   CULTURE, RESPIRATORY     Status: Normal (Preliminary result)   Collection Time   05/05/11 12:05 PM      Component Value Range Status Comment   Specimen Description SPUTUM   Final    Special Requests NONE   Final    Gram Stain     Final    Value: RARE WBC PRESENT,BOTH PMN AND MONONUCLEAR     NO SQUAMOUS EPITHELIAL CELLS SEEN     NO ORGANISMS SEEN   Culture Culture reincubated for better growth   Final    Report Status PENDING   Incomplete     Studies/Results: No results found.    Assessment/Plan: Daniel House is a 36 y.o. male with  Newly diagnosed HIV/AIDS, and PCP pneumonia:   1) PCP Pneumonia: He is slow to improve from resp standpoint. Lik[place] has premature COPD underlying this PCP pneumonia --changed to bactrim to high dose PO and  Prednisone --will need 21 day course of anti PCP therapy with steroids --would NOT taper steroids yet  2) Syphilis; has had years ago. Possibly reinfected. Neurosyphilis could also be concern> Will give him IM PCN today and weekly x 3 doses and follow titers   3) HIV/AIDS:  Genotype pending.  --continue prezista/norvir and truvada -- per [employer] Poff  Pt close to approvaal for emergency ADAP, I put additional orders into epic for outpt world to get him meds at dc --azithromycin weekly for MAI prophylaxis  4) ANemia: Agree with blood transfusion. Wouldn't hesitate to give him 2 U  PRBC  Disp: Pt is going to need help with placement> I anticipate him needing O2 in SNF vs with home health. Would be VERY careful with him given his tenuous resp status. WOuld not dc him until he is improving with [facility] LESS O2 requirements and with stable environement to get O2 if needed. His O2 requirements likely even higher with ambulation      LOS: 9 days   Acey Lav 05/06/2011, 4:10 PM

## 2011-05-06 NOTE — Progress Notes (Signed)
Family Medicine Teaching Service Attending Note  I discussed patient Daniel House  with Dr. Mikel Cella and reviewed their note for today.  I agree with their assessment and plan.     Gradual steady improvement

## 2011-05-06 NOTE — Telephone Encounter (Signed)
perfect

## 2011-05-06 NOTE — Progress Notes (Signed)
Clinical Child psychotherapist (CSW) observed PT has recommended SNF for pt. CSW visited pt room and explored pt thoughts regarding placement. Pt states he prefers to return home with his sister Missy at [employer] (who he was staying with prior to being admitted) however will discuss placement option with family and have a decision for CSW by 11am. CSW informed pt that there is also a possibility that pt will not receive bed offers in [facility] and therefore consider a facility in a surrounding county. Pt states he understands. CSW will follow up with pt later today.  Theresia Bough, MSW, Theresia Majors [phone]

## 2011-05-06 NOTE — Progress Notes (Signed)
Clinical Child psychotherapist (CSW) contacted the Artist to inquire whether an application has been completed. CSW was informed that the financial counselor will review pt to determine whether he qualifies for long term disability. Financial counselor will also complete a Medicaid application for pt tomorrow.   Theresia Bough, MSW, Theresia Majors [phone]

## 2011-05-06 NOTE — Progress Notes (Signed)
Clinical Social Work Department CLINICAL SOCIAL WORK PLACEMENT NOTE 05/06/2011  Patient:  Daniel House, Daniel House  Account Number:  [id number] Admit date:  04/27/2011  Clinical Social Worker:  Theresia Bough, Theresia Majors  Date/time:  05/06/2011 04:09 PM  Clinical Social Work is seeking post-discharge placement for this patient at the following level of care:   SKILLED NURSING   (*CSW will update this form in Epic as items are completed)     Patient/family provided with [facility] Department of Clinical Social Work's list of facilities offering this level of care within the geographic area requested by the patient (or if unable, by the patient's family).    Patient/family informed of their freedom to choose among providers that offer the needed level of care, that participate in Medicare, Medicaid or managed care program needed by the patient, have an available bed and are willing to accept the patient.    Patient/family informed of [facility]' ownership interest in [facility], as well as of the fact that they are under no obligation to receive care at this facility.  PASARR submitted to EDS on 05/06/2011 PASARR number received from EDS on   FL2 transmitted to all facilities in geographic area requested by pt/family on  05/06/2011 FL2 transmitted to all facilities within larger geographic area on 05/06/2011  Patient informed that his/her managed care company has contracts with or will negotiate with  certain facilities, including the following:     Patient/family informed of bed offers received:   Patient chooses bed at  Physician recommends and patient chooses bed at    Patient to be transferred to  on   Patient to be transferred to facility by   The following physician request were entered in Epic:   Additional Comments:  Theresia Bough, MSW, [employer] [phone]

## 2011-05-06 NOTE — Progress Notes (Signed)
RT completed a follow up assessment of patient.  Patient is still getting short of breath when walking and requiring more O2 for any activity.  Patient states that he feels that the treatments are helping him.  BBS are diminished. No Wheezing heard.  RT will leave treatments scheduled three times a day.  Will continue to monitor.

## 2011-05-07 LAB — TYPE AND SCREEN
Antibody Screen: NEGATIVE
Unit division: 0

## 2011-05-07 LAB — BASIC METABOLIC PANEL
CO2: 20 mEq/L (ref 19–32)
Calcium: 8.4 mg/dL (ref 8.4–10.5)
Creatinine, Ser: 0.95 mg/dL (ref 0.50–1.35)
Glucose, Bld: 111 mg/dL — ABNORMAL HIGH (ref 70–99)

## 2011-05-07 LAB — CULTURE, RESPIRATORY W GRAM STAIN

## 2011-05-07 LAB — CBC
Hemoglobin: 9.2 g/dL — ABNORMAL LOW (ref 13.0–17.0)
MCH: 31.1 pg (ref 26.0–34.0)
MCV: 89.5 fL (ref 78.0–100.0)
RBC: 2.96 MIL/uL — ABNORMAL LOW (ref 4.22–5.81)

## 2011-05-07 NOTE — Progress Notes (Signed)
Occupational Therapy Treatment Patient Details Name: Daniel House MRN: [id number] DOB: Jun 21, 1975 Today's Date: 05/07/2011  OT Assessment/Plan OT Assessment/Plan Comments on Treatment Session: Pt beginning to wean off O2 but continues to decrease sats considerably during minimal activity.  May benefit from CIR. OT Plan: Discharge plan needs to be updated OT Frequency: Min 2X/week Recommendations for Other Services: Rehab consult Follow Up Recommendations: Inpatient Rehab;Skilled nursing facility Equipment Recommended: Defer to next venue OT Goals ADL Goals Pt Will Perform Grooming: with set-up;with supervision;Supported;Sitting, chair ADL Goal: Grooming - Progress: Progressing toward goals Pt Will Perform Upper Body Bathing: with set-up;with supervision;Supported;Sitting, chair ADL Goal: Upper Body Bathing - Progress: Progressing toward goals Pt Will Perform Lower Body Bathing: with set-up;with supervision;Supported;Sit to stand from chair ADL Goal: Lower Body Bathing - Progress: Progressing toward goals Miscellaneous OT Goals Miscellaneous OT Goal #1: Pt wil independently use purse lipped breathing during other goal activities. OT Goal: Miscellaneous Goal #1 - Progress: Progressing toward goals  OT Treatment Precautions/Restrictions  Precautions Precautions: Fall Restrictions Weight Bearing Restrictions: No Other Position/Activity Restrictions: Desats considerably with activity, even with supplemental O2   ADL ADL Grooming: Performed;Wash/dry face;Set up Grooming Details (indicate cue type and reason): Setup to gather grooming items. Where Assessed - Grooming: Sitting, bed Upper Body Bathing: Performed;Chest;Right arm;Left arm;Abdomen;Set up Upper Body Bathing Details (indicate cue type and reason): Pt's sats decreased quickly to 83% and required increase from 4L to 5L. Pt continuous use of pursed lip breathing throughout task, Where Assessed - Upper Body Bathing: Sitting,  bed;Unsupported Lower Body Bathing: Performed;Minimal assistance Lower Body Bathing Details (indicate cue type and reason): Required increased time, ~12 minutes. Min assist for sit to stand during peri care. Pt required 4 rest breaks throughout task.  Decreased sats to 81%; had to increase O2 from 4L to 5L for ~2 min.  Where Assessed - Lower Body Bathing: Supported;Sit to stand from bed Lower Body Dressing: Performed;Supervision/safety Lower Body Dressing Details (indicate cue type and reason): Pt donned/doffed bil. socks  Where Assessed - Lower Body Dressing: Sitting, bed;Unsupported Ambulation Related to ADLs: Unable due to fatigue ADL Comments: Pt self initiating rest breaks and use of pursed lipped breathing techniques. Mobility    Exercises    End of Session OT - End of Session Activity Tolerance: Patient limited by fatigue Patient left: in bed;with call bell in reach Nurse Communication: Mobility status for transfers General Behavior During Session: [facility] for tasks performed Cognition: [facility] for tasks performed   5:20 PM 05/07/2011 Cipriano Mile OTR/L Pager [phone] Office [phone]

## 2011-05-07 NOTE — Progress Notes (Signed)
Patient ID: Daniel House, male   DOB: Dec 01, 1975, 36 y.o.   MRN: [id number]  Daily Progress Note Family Medicine Teaching Service Service Pager: [phone]  Subjective:  Patient doing much better today. S/P 1 unit of PRBC, patient states he has more energy and maintaining sats. He does have edema of ankles. No complaints this morning  I have reviewed the patient's medications.  Objective Temp:  [97.8 F (36.6 C)-98.5 F (36.9 C)] 98.5 F (36.9 C) (04/17 0547) Pulse Rate:  [83-90] 86  (04/17 0547) Resp:  [18-21] 18  (04/17 0547) BP: (107-119)/(70-97) 119/81 mmHg (04/17 0547) SpO2:  [91 %-100 %] 98 % (04/17 0757)   Intake/Output Summary (Last 24 hours) at 05/07/11 0841 Last data filed at 05/07/11 0547  Gross per 24 hour  Intake    840 ml  Output   1126 ml  Net   -286 ml   General: awake, alert, sitting on side of bed, appears comfortable. [place] in place HEENT: AT/[place], sclera white, MMM Lymph: no cervical or supraclavicular LAD CV: RRR, no murmurs Pulm: CTAB  Abd:  Soft, +BS, NTND Ext: mild nonpitting edema of feet bilaterally  Neuro: alert, oriented  Labs and Imaging  Lab 05/07/11 0717 05/06/11 0500 05/04/11 0515  WBC 7.6 6.1 6.7  HGB 9.2* 7.2* 8.5*  HCT 26.5* 21.3* 24.8*  PLT 361 387 414*    Lab 05/06/11 0500 05/04/11 0515 05/03/11 0500 05/01/11 0430  NA 134* 130* 132* --  K 4.3 4.9 4.8 --  CL 105 103 102 --  CO2 20 19 20  --  BUN 24* 24* 25* --  CREATININE 0.96 1.11 1.13 --  CALCIUM 8.3* 8.6 8.6 --  PROT -- -- 6.1 6.1  BILITOT -- -- 0.2* <0.1*  ALKPHOS -- -- 125* 120*  ALT -- -- 17 9  AST -- -- 54* 52*  GLUCOSE 147* 99 101* --   TSH 0.792 ACE 38 CD4 20 HIV Quant 837,756 LDH 715 Crytpo Neg RPR+, Titer 1:16   Assessment and Plan 36 year old Male with no significant PMHx who presents with 1 week of dyspnea, found to be tachycardic and hypoxic in ER, imagining concerning for interstitial lung disease:   1. Pulmonary - Imaging and clinical picture concerning  for PCP pneumonia, or atypical infection. Transferred to SDU on 04/30/11 due to worsening respiratory status. Respiratory status seems stable, but when ambulating yesterday still required venturi mask to keep oxygen sats up. Transferred back to tele floor on 05/04/11. Overall patient is continue to improve - HIV positive. CD4 count 20. HIV viral load 837K. - ID consulted. We appreciate their excellent care of this patient, as well as arranging for patient to receive HIV meds. - RPR came back last night reactive, with titer of 1:16.  Confirmation test sent, with TP-PA >8.0.  There is an increased incidence of false + RPR in HIV patients, but given patient's history of syphilis, treated with Penicillin yesterday, and weekly for next 3 weeks.  - Patient has been on Bactrim IV 15 mg/kg of TMP divided q8 hours on started 04/29/11 after not improving on PO. This is day #10 of antibiotics. Transitioned back to PO Bactrim on 05/05/14. Tolerating well - Transitioned to Prednisone from Solumedrol (day #8). Will not taper until ID suggests  - Antiretroviral therapy per ID - Continue O2 via [place]. Stable on [place] on floor overnight. Will start humidified air, as well as saline spray as needed. Continue to wean as necessary - Will switch neb treatments  to prn - [name], whose phone number is listed in patient's room if needed.  2. Acute renal insufficiency- Noted on admission; may be due to dehydration +/- NSAID use, but again concerning for pulmonary-renal cause of illness or HIVAN. UA significant for spec grav at 1.040, 100 of protein, and red cells on admission. Resolved. - Creatinine continues to improve.   - IVF @ 27 cc's/hr, patient has good urine output and normotensive.  Will continue for now pending appetite improvement/fluid intake.   3. Anemia- HgB improved. No signs of active bleeding. S/p 1 unit PRBC on 05/06/11. Hgb 9.2 post-transfusion - Discussed his HgB with patient  . States he is not having frequent stools. Denies dark/bloody bowel movement.  - Continue Iron - Continue to monitor. Repeat CBC tomorrow. Can consider repeat transfusion if patient remains symptomatic.  4. FEN/GI- patient appears malnourished, albumin low at 2.1. Will allow regular diet, give zofran prn, and protonix for GI prophylaxis. NS decrease to 75 cc/hr. Miralax as needed for constipation. - Continue Remeron to help with appetite.  - Encourage patient to eat whatever he wants. Continue to monitor I/O and daily weights. - Nutrition following  5. DVT ppx- SQ heparin   6. Disposition- Pending further clinical improvement. Continue to follow ID recommendations. Final dispo will be to SNF for short term rehab. Patient is not stable yet. Continue PT/OT inpatient, and continue current medical treatments as described above.  [name], Dajanae Brophy Pager: [phone] 05/07/2011, 8:41 AM

## 2011-05-07 NOTE — Progress Notes (Signed)
Family Medicine Teaching Service Attending Note  I discussed patient Daniel House  with Dr. Mikel Cella and reviewed their note for today.  I agree with their assessment and plan.    Check to see if rehab candidate

## 2011-05-07 NOTE — Progress Notes (Signed)
CSW informed pt that currently he has no bed offers however facilities outside of [facility] are considering him. Pt stated he would prefer [facility] if he needs to be placed outside of [facility]. CSW will continue following up with facilities and facilitate with dc when a bed becomes available.  Theresia Bough, MSW, Theresia Majors [phone]

## 2011-05-07 NOTE — Progress Notes (Signed)
Physical Therapy Note   05/07/11 1048  PT Visit Information  Last PT Received On 05/07/11  Precautions  Precautions Fall  Restrictions  Other Position/Activity Restrictions Desats considerably with activity, even with supplemental O2  Bed Mobility  Supine to Sit 5: Supervision;HOB elevated (Comment degrees)  Supine to Sit Details (indicate cue type and reason) Better activity tol on less O2 today  Sitting - Scoot to Edge of Bed 7: Independent  Transfers  Sit to Stand 4: Min assist;With upper extremity assist;From bed  Sit to Stand Details (indicate cue type and reason) Cues for safety, self-monitor; deep breathing  Stand to Sit 4: Min assist;With upper extremity assist;With armrests;To chair/3-in-1  Stand to Sit Details cues for control  Ambulation/Gait  Ambulation/Gait Assistance 4: Min assist  Ambulation/Gait Assistance Details (indicate cue type and reason) Cues for self monitor for activity tol; and to use RW t decr work of walking  Aeronautical engineer (Feet) 20 Feet  Assistive device Rolling walker  PT - End of Session  Equipment Utilized During Treatment (Supplemental O@)  Activity Tolerance Patient tolerated treatment well (on less O2 than 2 days ago)  Patient left in chair;with call bell in reach  Nurse Communication Mobility status for ambulation  General  Behavior During Session [facility] for tasks performed  Cognition [facility] for tasks performed  PT - Assessment/Plan  Comments on Treatment Session Improving activity tolerance;   Initiated session on 4 liters O2with amb, desatted to 83% observed lowest, increased O2 to 5 liters and pt recovered O2 sats to 92% with seated rest; Pt on 4 liters O2 in chair with O2 sats at 93% end of session;   Considering imporvement in activity tol and functional mobility between eval and today's session, feel CIR is a viable option for postacute rehab  PT Plan Discharge plan needs to be updated  PT Frequency Min 3X/week  Follow Up  Recommendations Inpatient Rehab  Equipment Recommended Defer to next venue  Acute Rehab PT Goals  Time For Goal Achievement 2 weeks  Pt will go Supine/Side to Sit with modified independence  PT Goal: Supine/Side to Sit - Progress Progressing toward goal  Pt will Sit at [facility] of Bed with modified independence  PT Goal: Sit at [facility] Of Bed - Progress Progressing toward goal  Pt will go Sit to Stand with modified independence  PT Goal: Sit to Stand - Progress Progressing toward goal  Pt will go Stand to Sit with modified independence  PT Goal: Stand to Sit - Progress Progressing toward goal  Pt will Transfer Bed to Chair/Chair to Bed with modified independence  Pt will Ambulate 51 - 150 feet;with supervision;with rolling walker  PT Goal: Ambulate - Progress Progressing toward goal    [place], [place] [phone]

## 2011-05-07 NOTE — Progress Notes (Signed)
Subjective: Pt still with need 5L at restt  Antibiotics:  Anti-infectives     Start     Dose/Rate Route Frequency Ordered Stop   05/07/11 0800   ritonavir (NORVIR) tablet 100 mg        100 mg Oral Daily with breakfast 05/06/11 1010     05/05/11 1400   penicillin g benzathine (BICILLIN LA) 1200000 UNIT/2ML injection 2.4 Million Units        2.4 Million Units Intramuscular Weekly 05/05/11 1224 05/26/11 1359   05/05/11 1400  sulfamethoxazole-trimethoprim (BACTRIM DS) 800-160 MG per tablet 2 tablet       2 tablet Oral 3 times per day 05/05/11 1225     05/03/11 1100   Darunavir Ethanolate (PREZISTA) tablet 800 mg        800 mg Oral Daily with breakfast 05/03/11 0801     05/02/11 1000   Darunavir Ethanolate (PREZISTA) tablet 800 mg  Status:  Discontinued        800 mg Oral Daily 05/02/11 0928 05/03/11 0801   05/02/11 1000   ritonavir (NORVIR) tablet 100 mg  Status:  Discontinued        100 mg Oral Daily 05/02/11 0928 05/06/11 1010   05/02/11 1000   emtricitabine-tenofovir (TRUVADA) 200-300 MG per tablet 1 tablet        1 tablet Oral Daily 05/02/11 0928     05/01/11 2000   sulfamethoxazole-trimethoprim (BACTRIM) 255 mg in dextrose 5 % 250 mL IVPB  Status:  Discontinued        255 mg 265.9 mL/hr over 60 Minutes Intravenous Every 8 hours 05/01/11 1908 05/05/11 1225   04/30/11 1600   azithromycin (ZITHROMAX) 500 mg in dextrose 5 % 250 mL IVPB  Status:  Discontinued        500 mg 250 mL/hr over 60 Minutes Intravenous Every 24 hours 04/30/11 1455 05/02/11 1044   04/30/11 1600   ceFEPIme (MAXIPIME) 1 g in dextrose 5 % 50 mL IVPB  Status:  Discontinued        1 g 100 mL/hr over 30 Minutes Intravenous Every 12 hours 04/30/11 1455 05/02/11 1044   04/29/11 1100   sulfamethoxazole-trimethoprim (BACTRIM) 255 mg in dextrose 5 % 250 mL IVPB  Status:  Discontinued        255 mg 265.9 mL/hr over 60 Minutes Intravenous Every 8 hours 04/29/11 1008 05/01/11 1909   04/28/11 1600    sulfamethoxazole-trimethoprim (BACTRIM DS) 800-160 MG per tablet 2 tablet  Status:  Discontinued        2 tablet Oral 3 times daily 04/28/11 1348 04/29/11 1008   04/28/11 1330   sulfamethoxazole-trimethoprim (BACTRIM,SEPTRA) 400-80 MG per tablet 2.5 tablet  Status:  Discontinued        2.5 tablet Oral 4 times per day 04/28/11 1224 04/28/11 1348   04/28/11 1100   sulfamethoxazole-trimethoprim (BACTRIM DS) 800-160 MG per tablet 1 tablet  Status:  Discontinued        1 tablet Oral Daily 04/28/11 1046 04/28/11 1224   04/27/11 2345   cefTRIAXone (ROCEPHIN) 1 g in dextrose 5 % 50 mL IVPB        1 g 100 mL/hr over 30 Minutes Intravenous  Once 04/27/11 2338 04/28/11 0028   04/27/11 2345   azithromycin (ZITHROMAX) 500 mg in dextrose 5 % 250 mL IVPB        500 mg 250 mL/hr over 60 Minutes Intravenous  Once 04/27/11 2338 04/28/11 0128  Medications: Scheduled Meds:    . ipratropium  0.5 mg Nebulization TID   And  . albuterol  2.5 mg Nebulization TID  . antiseptic oral rinse  15 mL Mouth Rinse BID  . darunavir  800 mg Oral Q breakfast  . docusate sodium  100 mg Oral BID  . emtricitabine-tenofovir  1 tablet Oral Daily  . ferrous sulfate  325 mg Oral TID WC  . heparin  5,000 Units Subcutaneous Q8H  . mirtazapine  15 mg Oral QHS  . penicillin g benzathine (BICILLIN-LA) IM  2.4 Million Units Intramuscular Weekly  . predniSONE  40 mg Oral BID WC  . ritonavir  100 mg Oral Q breakfast  . sodium chloride  3 mL Intravenous Q12H  . sulfamethoxazole-trimethoprim  2 tablet Oral Q8H   Continuous Infusions:    . sodium chloride 75 mL/hr at 05/07/11 1622   PRN Meds:.acetaminophen, acetaminophen, albuterol, albuterol, alum & mag hydroxide-simeth, benzonatate, diphenhydrAMINE, ondansetron (ZOFRAN) IV, ondansetron, oxyCODONE, polyethylene glycol, sodium chloride, zolpidem   Objective: Weight change:   Intake/Output Summary (Last 24 hours) at 05/07/11 1704 Last data filed at 05/07/11  1500  Gross per 24 hour  Intake    720 ml  Output   1425 ml  Net   -705 ml   Blood pressure 117/72, pulse 85, temperature 98.3 F (36.8 C), temperature source Oral, resp. rate 20, height 6' (1.829 m), weight 110 lb (49.896 kg), SpO2 97.00%. Temp:  [98 F (36.7 C)-98.6 F (37 C)] 98.3 F (36.8 C) (04/17 1400) Pulse Rate:  [83-90] 85  (04/17 1400) Resp:  [18-21] 20  (04/17 1400) BP: (107-119)/(72-97) 117/72 mmHg (04/17 1400) SpO2:  [83 %-100 %] 97 % (04/17 1446)  Physical Exam: General: Alert and awake, oriented x3, a HEENT: anicteric sclera, pupils reactive to light and accommodation, EOMI CVS, normal r,  no murmur rubs or gallops Chest: wheezes and rhonchi esp posteriorly Abdomen: soft nontender, nondistended, normal bowel sounds, Extremities: no  clubbing or edema noted bilaterally Skin: no rashes  Neuro: nonfocal  Lab Results:  Basename 05/07/11 0717 05/06/11 0500  WBC 7.6 6.1  HGB 9.2* 7.2*  HCT 26.5* 21.3*  PLT 361 387    BMET  Basename 05/07/11 0717 05/06/11 0500  NA 133* 134*  K 4.4 4.3  CL 106 105  CO2 20 20  GLUCOSE 111* 147*  BUN 20 24*  CREATININE 0.95 0.96  CALCIUM 8.4 8.3*    Micro Results: Recent Results (from the past 240 hour(s))  CULTURE, BLOOD (ROUTINE X 2)     Status: Normal   Collection Time   04/28/11 12:30 AM      Component Value Range Status Comment   Specimen Description BLOOD RIGHT ARM   Final    Special Requests BOTTLES DRAWN AEROBIC ONLY 5CC   Final    Culture  Setup Time [id number]   Final    Culture NO GROWTH 5 DAYS   Final    Report Status 05/04/2011 FINAL   Final   CULTURE, BLOOD (ROUTINE X 2)     Status: Normal   Collection Time   04/28/11 12:40 AM      Component Value Range Status Comment   Specimen Description BLOOD RIGHT WRIST   Final    Special Requests BOTTLES DRAWN AEROBIC ONLY [facility]   Final    Culture  Setup Time [id number]   Final    Culture NO GROWTH 5 DAYS   Final    Report Status 05/04/2011 FINAL  Final     MRSA PCR SCREENING     Status: Normal   Collection Time   04/28/11  4:14 AM      Component Value Range Status Comment   MRSA by PCR NEGATIVE  NEGATIVE  Final   CULTURE, SPUTUM-ASSESSMENT     Status: Normal   Collection Time   05/05/11 12:05 PM      Component Value Range Status Comment   Specimen Description SPUTUM   Final    Special Requests NONE   Final    Sputum evaluation     Final    Value: THIS SPECIMEN IS ACCEPTABLE. RESPIRATORY CULTURE REPORT TO FOLLOW.   Report Status 05/05/2011 FINAL   Final   CULTURE, RESPIRATORY     Status: Normal   Collection Time   05/05/11 12:05 PM      Component Value Range Status Comment   Specimen Description SPUTUM   Final    Special Requests NONE   Final    Gram Stain     Final    Value: RARE WBC PRESENT,BOTH PMN AND MONONUCLEAR     NO SQUAMOUS EPITHELIAL CELLS SEEN     NO ORGANISMS SEEN   Culture NORMAL OROPHARYNGEAL FLORA   Final    Report Status 05/07/2011 FINAL   Final     Studies/Results: No results found.    Assessment/Plan: Daniel House is a 36 y.o. male with  Newly diagnosed HIV/AIDS, and PCP pneumonia:   1) PCP Pneumonia: He is slow to improve from resp standpoint. Likley has premature COPD underlying this PCP pneumonia --changed to bactrim to high dose PO and  Prednisone --will need 21 day course of anti PCP therapy with steroids --plan on taper prednisone to 40 daily in next day or so  2) Syphilis; has had years ago. Possibly reinfected. Neurosyphilis could also be concern> Will give him IM PCN today and weekly x 3 doses and follow titers   3) HIV/AIDS:  Genotype pending.  --continue prezista/norvir and truvada -- per [employer]  Pt  Has ADAP approval --azithromycin weekly for MAI prophylaxis   Disp: Pt is going to need help with placement> I anticipate him needing O2 in SNF vs with home health. Would be VERY careful with him given his tenuous resp status. WOuld not dc him until he is improving with [facility]  LESS O2 requirements and with stable environement to get O2 if needed. His O2 requirements likely even higher with ambulation      LOS: 10 days   Acey Lav 05/07/2011, 5:04 PM

## 2011-05-08 DIAGNOSIS — R5381 Other malaise: Secondary | ICD-10-CM

## 2011-05-08 LAB — BASIC METABOLIC PANEL
Calcium: 8.2 mg/dL — ABNORMAL LOW (ref 8.4–10.5)
Creatinine, Ser: 0.82 mg/dL (ref 0.50–1.35)
GFR calc Af Amer: >90 mL/min (ref >90)

## 2011-05-08 LAB — CBC
MCH: 30.8 pg (ref 26.0–34.0)
MCV: 90.1 fL (ref 78.0–100.0)
Platelets: 346 10*3/uL (ref 150–400)
RDW: 14.9 % (ref 11.5–15.5)

## 2011-05-08 MED ORDER — PROMETHAZINE HCL 25 MG/ML IJ SOLN
12.5000 mg | Freq: Once | INTRAMUSCULAR | Status: AC
  Administered 2011-05-08: 12.5 mg via INTRAVENOUS
  Filled 2011-05-08: qty 1

## 2011-05-08 MED ORDER — SUMATRIPTAN 20 MG/ACT NA SOLN: 20.0000 mg | NASAL | Status: DC | PRN

## 2011-05-08 NOTE — Progress Notes (Signed)
Patient ID: Daniel House, male   DOB: 22-Aug-1975, 36 y.o.   MRN: [id number]  Daily Progress Note Family Medicine Teaching Service Service Pager: [phone]  Subjective:  Patient complaining of new headache behind right eye this morning. Some light sensitivities. No neck stiffness or chills. Otherwise, no acute events.  I have reviewed the patient's medications.  Objective Temp:  [98.3 F (36.8 C)-98.9 F (37.2 C)] 98.6 F (37 C) (04/18 0553) Pulse Rate:  [85-98] 98  (04/18 0553) Resp:  [18-20] 18  (04/18 0553) BP: (113-119)/(72-82) 119/79 mmHg (04/18 0553) SpO2:  [83 %-99 %] 93 % (04/18 0732) Weight:  [112 lb 3.2 oz (50.894 kg)] 112 lb 3.2 oz (50.894 kg) (04/17 2201)   Intake/Output Summary (Last 24 hours) at 05/08/11 0804 Last data filed at 05/08/11 0351  Gross per 24 hour  Intake    720 ml  Output   1550 ml  Net   -830 ml   General: awake, alert, lying in bed, seems more uncomfortable. [name] in place on 5L HEENT: AT/Progress, sclera white, MMM Lymph: no cervical or supraclavicular LAD CV: RRR, no murmurs Pulm: CTAB  Abd:  Soft, +BS, NTND Ext: mild nonpitting edema of feet bilaterally  Neuro: alert, oriented  Labs and Imaging  Lab 05/08/11 0520 05/07/11 0717 05/06/11 0500  WBC 7.8 7.6 6.1  HGB 9.0* 9.2* 7.2*  HCT 26.3* 26.5* 21.3*  PLT 346 361 387    Lab 05/08/11 0520 05/07/11 0717 05/06/11 0500 05/03/11 0500  NA 133* 133* 134* --  K 4.1 4.4 4.3 --  CL 104 106 105 --  CO2 20 20 20  --  BUN 16 20 24* --  CREATININE 0.82 0.95 0.96 --  CALCIUM 8.2* 8.4 8.3* --  PROT -- -- -- 6.1  BILITOT -- -- -- 0.2*  ALKPHOS -- -- -- 125*  ALT -- -- -- 17  AST -- -- -- 54*  GLUCOSE 129* 111* 147* --   TSH 0.792 ACE 38 CD4 20 HIV Quant 837,756 LDH 715 Crytpo Neg RPR+, Titer 1:16   Assessment and Plan 36 year old Male with no significant PMHx who presents with 1 week of dyspnea, found to be tachycardic and hypoxic in ER, imagining concerning for interstitial lung disease:   1.  Pulmonary - Imaging and clinical picture concerning for PCP pneumonia, or atypical infection. Transferred to SDU on 04/30/11 due to worsening respiratory status. Respiratory status seems stable, but when ambulating yesterday still required venturi mask to keep oxygen sats up. Transferred back to tele floor on 05/04/11. Overall patient is continue to improve - HIV positive. CD4 count 20. HIV viral load 837K. - ID consulted. We appreciate their excellent care of this patient, as well as arranging for patient to receive HIV meds. - RPR came back last night reactive, with titer of 1:16.  Confirmation test sent, with TP-PA >8.0.  There is an increased incidence of false + RPR in HIV patients, but given patient's history of syphilis, treated with Penicillin yesterday, and weekly for next 3 weeks.  - Patient has been on Bactrim IV 15 mg/kg of TMP divided q8 hours on started 04/29/11 after not improving on PO. This is day #10 of antibiotics. Transitioned back to PO Bactrim on 05/05/14. Tolerating well - Transitioned to Prednisone from Solumedrol (day #8). Will not taper until ID suggests  - Antiretroviral therapy per ID - Continue O2 via [place]. Stable on [place] on floor overnight. Will start humidified air, as well as saline spray as needed.  Continue to wean as necessary - Will switch neb treatments to prn - [name], whose phone number is listed in patient's room if needed. - HA this morning. Will closely follow. Seemed to improve with Tylenol. If does not resolve, will consider imaging head +/- LP. - Patient has done really well with PT/OT and making improvement. CIR consulted and will evaluate patient again this afternoon. Await their evaluation.  2. Acute renal insufficiency- Noted on admission; may be due to dehydration +/- NSAID use, but again concerning for pulmonary-renal cause of illness or HIVAN. UA significant for spec grav at 1.040, 100 of protein, and red cells on  admission. Resolved. - Creatinine continues to improve.   - IVF @ 31 cc's/hr, patient has good urine output and normotensive.  Will continue for now pending appetite improvement/fluid intake.   3. Anemia- HgB improved. No signs of active bleeding. S/p 1 unit PRBC on 05/06/11. Hgb 9.2 post-transfusion - Discussed his HgB with patient . States he is not having frequent stools. Denies dark/bloody bowel movement.  - Continue Iron - Continue to monitor. Repeat CBC tomorrow. Can consider repeat transfusion if patient remains symptomatic.  4. FEN/GI- patient appears malnourished, albumin low at 2.1. Will allow regular diet, give zofran prn, and protonix for GI prophylaxis. NS decrease to 75 cc/hr. Miralax as needed for constipation. - Continue Remeron to help with appetite.  - Encourage patient to eat whatever he wants. Continue to monitor I/O and daily weights. - Nutrition following  5. DVT ppx- SQ heparin   6. Disposition- Pending further clinical improvement. Continue to follow ID recommendations. Awaiting CIR recommendations  Code: Gae Bon, Thi Sisemore Pager: [phone] 05/08/2011, 8:04 AM

## 2011-05-08 NOTE — Progress Notes (Addendum)
Clinical Child psychotherapist (CSW) observed PT note that CIR is recommended for pt. At dc. CSW has spoken to pt and confirmed that pt lives with his sister who works during the day therefore pt does not have 24hr care at home. CSW will continue searching for placement for pt. At this time pt has no bed offers in 5 surrounding counties.  Theresia Bough, MSW, Theresia Majors [phone]

## 2011-05-08 NOTE — Progress Notes (Signed)
Family Medicine Teaching Service Attending Note  I discussed patient Daniel House  with Dr. Mikel Cella and reviewed their note for today.  I agree with their assessment and plan.

## 2011-05-08 NOTE — Progress Notes (Signed)
Subjective: Pt still with need 5L after trial at 3l  Antibiotics:  Anti-infectives     Start     Dose/Rate Route Frequency Ordered Stop   05/07/11 0800   ritonavir (NORVIR) tablet 100 mg        100 mg Oral Daily with breakfast 05/06/11 1010     05/05/11 1400   penicillin g benzathine (BICILLIN LA) 1200000 UNIT/2ML injection 2.4 Million Units        2.4 Million Units Intramuscular Weekly 05/05/11 1224 05/26/11 1359   05/05/11 1400  sulfamethoxazole-trimethoprim (BACTRIM DS) 800-160 MG per tablet 2 tablet       2 tablet Oral 3 times per day 05/05/11 1225     05/03/11 1100   Darunavir Ethanolate (PREZISTA) tablet 800 mg        800 mg Oral Daily with breakfast 05/03/11 0801     05/02/11 1000   Darunavir Ethanolate (PREZISTA) tablet 800 mg  Status:  Discontinued        800 mg Oral Daily 05/02/11 0928 05/03/11 0801   05/02/11 1000   ritonavir (NORVIR) tablet 100 mg  Status:  Discontinued        100 mg Oral Daily 05/02/11 0928 05/06/11 1010   05/02/11 1000   emtricitabine-tenofovir (TRUVADA) 200-300 MG per tablet 1 tablet        1 tablet Oral Daily 05/02/11 0928     05/01/11 2000   sulfamethoxazole-trimethoprim (BACTRIM) 255 mg in dextrose 5 % 250 mL IVPB  Status:  Discontinued        255 mg 265.9 mL/hr over 60 Minutes Intravenous Every 8 hours 05/01/11 1908 05/05/11 1225   04/30/11 1600   azithromycin (ZITHROMAX) 500 mg in dextrose 5 % 250 mL IVPB  Status:  Discontinued        500 mg 250 mL/hr over 60 Minutes Intravenous Every 24 hours 04/30/11 1455 05/02/11 1044   04/30/11 1600   ceFEPIme (MAXIPIME) 1 g in dextrose 5 % 50 mL IVPB  Status:  Discontinued        1 g 100 mL/hr over 30 Minutes Intravenous Every 12 hours 04/30/11 1455 05/02/11 1044   04/29/11 1100   sulfamethoxazole-trimethoprim (BACTRIM) 255 mg in dextrose 5 % 250 mL IVPB  Status:  Discontinued        255 mg 265.9 mL/hr over 60 Minutes Intravenous Every 8 hours 04/29/11 1008 05/01/11 1909   04/28/11 1600    sulfamethoxazole-trimethoprim (BACTRIM DS) 800-160 MG per tablet 2 tablet  Status:  Discontinued        2 tablet Oral 3 times daily 04/28/11 1348 04/29/11 1008   04/28/11 1330   sulfamethoxazole-trimethoprim (BACTRIM,SEPTRA) 400-80 MG per tablet 2.5 tablet  Status:  Discontinued        2.5 tablet Oral 4 times per day 04/28/11 1224 04/28/11 1348   04/28/11 1100   sulfamethoxazole-trimethoprim (BACTRIM DS) 800-160 MG per tablet 1 tablet  Status:  Discontinued        1 tablet Oral Daily 04/28/11 1046 04/28/11 1224   04/27/11 2345   cefTRIAXone (ROCEPHIN) 1 g in dextrose 5 % 50 mL IVPB        1 g 100 mL/hr over 30 Minutes Intravenous  Once 04/27/11 2338 04/28/11 0028   04/27/11 2345   azithromycin (ZITHROMAX) 500 mg in dextrose 5 % 250 mL IVPB        500 mg 250 mL/hr over 60 Minutes Intravenous  Once 04/27/11 2338 04/28/11 0128  Medications: Scheduled Meds:    . ipratropium  0.5 mg Nebulization TID   And  . albuterol  2.5 mg Nebulization TID  . antiseptic oral rinse  15 mL Mouth Rinse BID  . darunavir  800 mg Oral Q breakfast  . docusate sodium  100 mg Oral BID  . emtricitabine-tenofovir  1 tablet Oral Daily  . ferrous sulfate  325 mg Oral TID WC  . heparin  5,000 Units Subcutaneous Q8H  . mirtazapine  15 mg Oral QHS  . penicillin g benzathine (BICILLIN-LA) IM  2.4 Million Units Intramuscular Weekly  . predniSONE  40 mg Oral BID WC  . promethazine  12.5 mg Intravenous Once  . ritonavir  100 mg Oral Q breakfast  . sodium chloride  3 mL Intravenous Q12H  . sulfamethoxazole-trimethoprim  2 tablet Oral Q8H   Continuous Infusions:    . sodium chloride 75 mL/hr at 05/08/11 0544   PRN Meds:.acetaminophen, acetaminophen, albuterol, albuterol, alum & mag hydroxide-simeth, benzonatate, diphenhydrAMINE, ondansetron (ZOFRAN) IV, ondansetron, oxyCODONE, polyethylene glycol, sodium chloride, zolpidem, DISCONTD: SUMAtriptan   Objective: Weight change:   Intake/Output  Summary (Last 24 hours) at 05/08/11 1023 Last data filed at 05/08/11 1010  Gross per 24 hour  Intake    960 ml  Output   2100 ml  Net  -1140 ml   Blood pressure 116/75, pulse 84, temperature 98 F (36.7 C), temperature source Oral, resp. rate 20, height 6' (1.829 m), weight 112 lb 3.2 oz (50.894 kg), SpO2 99.00%. Temp:  [98 F (36.7 C)-98.9 F (37.2 C)] 98 F (36.7 C) (04/18 1000) Pulse Rate:  [84-98] 84  (04/18 1000) Resp:  [18-20] 20  (04/18 1000) BP: (114-119)/(72-82) 116/75 mmHg (04/18 1000) SpO2:  [93 %-99 %] 99 % (04/18 1000) Weight:  [112 lb 3.2 oz (50.894 kg)] 112 lb 3.2 oz (50.894 kg) (04/17 2201)  Physical Exam: General: Alert and awake, oriented x3, a HEENT: anicteric sclera, pupils reactive to light and accommodation, EOMI CVS, normal r,  no murmur rubs or gallops Chest: more clear Abdomen: soft nontender, nondistended, normal bowel sounds, Extremities: no  clubbing or edema noted bilaterally Skin: no rashes  Neuro: nonfocal  Lab Results:  Basename 05/08/11 0520 05/07/11 0717  WBC 7.8 7.6  HGB 9.0* 9.2*  HCT 26.3* 26.5*  PLT 346 361    BMET  Basename 05/08/11 0520 05/07/11 0717  NA 133* 133*  K 4.1 4.4  CL 104 106  CO2 20 20  GLUCOSE 129* 111*  BUN 16 20  CREATININE 0.82 0.95  CALCIUM 8.2* 8.4    Micro Results: Recent Results (from the past 240 hour(s))  CULTURE, SPUTUM-ASSESSMENT     Status: Normal   Collection Time   05/05/11 12:05 PM      Component Value Range Status Comment   Specimen Description SPUTUM   Final    Special Requests NONE   Final    Sputum evaluation     Final    Value: THIS SPECIMEN IS ACCEPTABLE. RESPIRATORY CULTURE REPORT TO FOLLOW.   Report Status 05/05/2011 FINAL   Final   CULTURE, RESPIRATORY     Status: Normal   Collection Time   05/05/11 12:05 PM      Component Value Range Status Comment   Specimen Description SPUTUM   Final    Special Requests NONE   Final    Gram Stain     Final    Value: RARE WBC PRESENT,BOTH  PMN AND MONONUCLEAR  NO SQUAMOUS EPITHELIAL CELLS SEEN     NO ORGANISMS SEEN   Culture NORMAL OROPHARYNGEAL FLORA   Final    Report Status 05/07/2011 FINAL   Final     Studies/Results: No results found.    Assessment/Plan: Daniel House is a 36 y.o. male with  Newly diagnosed HIV/AIDS, and PCP pneumonia:   1) PCP Pneumonia: He is slow to improve from resp standpoint. Likley has premature COPD underlying this PCP pneumonia --changed to bactrim to high dose PO and  Prednisone --will need 21 day course of anti PCP therapy with steroids -drop to 40mg  of prednisone and see how he does on this  2) Syphilis; has had years ago. Possibly reinfected. Neurosyphilis could also be concern> Will give him IM PCN today and weekly x 3 doses and follow titers   3) HIV/AIDS:   --continue prezista/norvir and truvada -- per [employer] P[employer]  Pt  Has ADAP approval --azithromycin weekly for MAI prophylaxis  5) Headache retro-orbital: If he has fevers OR if this persists without explanation would get LP with: --opening pressure --stat cryptococcal ag on CSF --csf cell count and differential -csf VDRL -csf FTA-abs -csf protein and glucose --csf cultures --csf for HSV by PCR, CMV PCR and save remaining CSF (for studies such as   toxo pcr, ebv pcr, JC virus should he had cns lesions on imaging suggesting toxo, primary cns lymphoma or PML)    Disp: Pt is going to need help with placement> I anticipate him needing O2 in SNF vs with home health. Would be VERY careful with him given his tenuous resp status. WOuld not dc him until he is improving with [facility] LESS O2 requirements and with stable environement to get O2 if needed. His O2 requirements likely even higher with ambulation      LOS: 11 days   Acey Lav 05/08/2011, 10:23 AM

## 2011-05-08 NOTE — Consult Note (Signed)
Physical Medicine and Rehabilitation Consult Reason for Consult: Deconditioning/pneumonia Referring Phsyician: Teaching services Daniel House is an 36 y.o. male.   HPI: 36 year old right-handed male with unremarkable past medical history. Admitted April 8 with complaints of shortness of breath over a 5 to six-day period slowly worsened. Noted productive cough and low-grade fever with chills. Chest x-ray revealed diffuse increased interstitial markings felt to represent interstitial infiltrate or pulmonary edema. CT of the chest negative for pulmonary emboli. Placed on broad-spectrum antibiotics for suspect community-acquired pneumonia. Placed on gentle intravenous fluids for elevated creatinine of 1.93. Workup showed RPR to be positive as well as HIV. CD4 count of 20. Patient was noted history of syphilis years ago possibly felt to be reinfected and received penicillin x3 doses. Infectious disease followup maintained on antivirals. Subcutaneous heparin for deep vein thrombosis prophylaxis. Bouts of anemia 7.2 and transfused. Respiratory status is improving however noted decrease in oxygen saturation when up ambulating requiring venturi mask. Physical and occupational therapy ongoing patient with severe deconditioning recommend physical medicine rehabilitation consult to consider inpatient rehabilitation services. Review of Systems  Constitutional: Positive for fever, chills, weight loss and malaise/fatigue.  Respiratory: Positive for cough, sputum production and shortness of breath.   Gastrointestinal: Positive for constipation.  Musculoskeletal: Positive for myalgias and back pain.  Neurological: Positive for weakness.  All other systems reviewed and are negative.   History reviewed. No pertinent past medical history. Past Surgical History  Procedure Date  . Appendectomy    No family history on file. Social History:  reports that he quit smoking about 2 weeks ago. He does not have any smokeless  tobacco history on file. He reports that he drinks alcohol. He reports that he does not use illicit drugs. Allergies: No Known Allergies Medications Prior to Admission  Medication Dose Route Frequency Provider Last Rate Last Dose  . 0.9 %  sodium chloride infusion   Intravenous Continuous Hilarie Fredrickson, MD 75 mL/hr at 05/08/11 0544    . acetaminophen (TYLENOL) tablet 650 mg  650 mg Oral Q6H PRN Ardyth Gal, MD   650 mg at 05/07/11 0444   Or  . acetaminophen (TYLENOL) suppository 650 mg  650 mg Rectal Q6H PRN Ardyth Gal, MD      . albuterol (PROVENTIL HFA;VENTOLIN HFA) 108 (90 BASE) MCG/ACT inhaler 2 puff  2 puff Inhalation Q4H PRN Ardyth Gal, MD      . albuterol (PROVENTIL) (5 MG/ML) 0.5% nebulizer solution 2.5 mg  2.5 mg Nebulization Q2H PRN Ardyth Gal, MD   2.5 mg at 05/02/11 1435  . ipratropium (ATROVENT) nebulizer solution 0.5 mg  0.5 mg Nebulization TID Tobin Chad, MD   0.5 mg at 05/07/11 2152   And  . albuterol (PROVENTIL) (5 MG/ML) 0.5% nebulizer solution 2.5 mg  2.5 mg Nebulization TID Tobin Chad, MD   2.5 mg at 05/07/11 2153  . alum & mag hydroxide-simeth (MAALOX/MYLANTA) 200-200-20 MG/5ML suspension 30 mL  30 mL Oral Q6H PRN Ardyth Gal, MD      . antiseptic oral rinse (BIOTENE) solution 15 mL  15 mL Mouth Rinse BID Tobin Chad, MD   15 mL at 05/07/11 2152  . azithromycin (ZITHROMAX) 500 mg in dextrose 5 % 250 mL IVPB  500 mg Intravenous Once Shaaron Adler, PA-C   500 mg at 04/28/11 0028  . benzonatate (TESSALON) capsule 100 mg  100 mg Oral TID PRN Ardyth Gal, MD   100 mg at 04/28/11 2232  . cefTRIAXone (ROCEPHIN) 1  g in dextrose 5 % 50 mL IVPB  1 g Intravenous Once Shaaron Adler, PA-C   1 g at 04/27/11 2358  . Darunavir Ethanolate (PREZISTA) tablet 800 mg  800 mg Oral Q breakfast Randall Hiss, MD   800 mg at 05/08/11 0541  . diphenhydrAMINE (BENADRYL) capsule 25 mg  25 mg Oral Q6H PRN Amber Nydia Bouton, MD      . docusate sodium (COLACE) capsule 100 mg  100 mg Oral BID Phebe Colla, MD   100 mg at 05/07/11 2147  . emtricitabine-tenofovir (TRUVADA) 200-300 MG per tablet 1 tablet  1 tablet Oral Daily Randall Hiss, MD   1 tablet at 05/07/11 1027  . ferrous sulfate tablet 325 mg  325 mg Oral TID WC Phebe Colla, MD   325 mg at 05/07/11 1622  . heparin injection 5,000 Units  5,000 Units Subcutaneous Q8H Ardyth Gal, MD   5,000 Units at 05/08/11 0541  . iohexol (OMNIPAQUE) 350 MG/ML injection 50 mL  50 mL Intravenous Once PRN Medication Radiologist, MD   50 mL at 04/27/11 2315  . mirtazapine (REMERON) tablet 15 mg  15 mg Oral QHS Phebe Colla, MD   15 mg at 05/07/11 2147  . ondansetron (ZOFRAN) tablet 4 mg  4 mg Oral Q6H PRN Ardyth Gal, MD       Or  . ondansetron [facility]) injection 4 mg  4 mg Intravenous Q6H PRN Ardyth Gal, MD   4 mg at 05/05/11 2220  . oxyCODONE (Oxy IR/ROXICODONE) immediate release tablet 5 mg  5 mg Oral Q6H PRN Hilarie Fredrickson, MD   5 mg at 05/06/11 1244  . penicillin g benzathine (BICILLIN LA) 1200000 UNIT/2ML injection 2.4 Million Units  2.4 Million Units Intramuscular Weekly Randall Hiss, MD   2.4 Million Units at 05/05/11 1636  . pneumococcal 23 valent vaccine (PNU-IMMUNE) injection 0.5 mL  0.5 mL Intramuscular Tomorrow-1000 Ardyth Gal, MD   0.5 mL at 04/29/11 0942  . polyethylene glycol (MIRALAX / GLYCOLAX) packet 17 g  17 g Oral Daily PRN Amber Nydia Bouton, MD      . predniSONE (DELTASONE) tablet 40 mg  40 mg Oral BID WC Randall Hiss, MD   40 mg at 05/07/11 1622  . ritonavir (NORVIR) tablet 100 mg  100 mg Oral Q breakfast Tobin Chad, MD   100 mg at 05/07/11 0842  . sodium chloride (OCEAN) 0.65 % nasal spray 1 spray  1 spray Each Nare PRN Hilarie Fredrickson, MD   1 spray at 05/05/11 1159  . sodium chloride 0.9 % bolus 1,000 mL  1,000 mL Intravenous Once Shaaron Adler, PA-C   1,000 mL at 04/27/11 2113  .  sodium chloride 0.9 % bolus 1,000 mL  1,000 mL Intravenous Once [employer], PA-C   1,000 mL at 04/28/11 0002  . sodium chloride 0.9 % bolus 1,000 mL  1,000 mL Intravenous Once Josalyn Funches, MD   1,000 mL at 04/28/11 1245  . sodium chloride 0.9 % injection 3 mL  3 mL Intravenous Q12H Ardyth Gal, MD   3 mL at 05/07/11 1101  . sodium polystyrene (KAYEXALATE) 15 GM/60ML suspension 15 g  15 g Oral Once Hilarie Fredrickson, MD   15 g at 05/02/11 1429  . sulfamethoxazole-trimethoprim (BACTRIM DS) 800-160 MG per tablet 2 tablet  2 tablet Oral Q8H Randall Hiss, MD   2 tablet at 05/08/11 0541  .  tuberculin injection 5 Units  5 Units Intradermal Once Ardyth Gal, MD   5 Units at 04/28/11 0603  . white petrolatum (VASELINE) gel           . zolpidem (AMBIEN) tablet 5 mg  5 mg Oral QHS PRN Ardyth Gal, MD   5 mg at 05/05/11 2209  . DISCONTD: albuterol (PROVENTIL) (5 MG/ML) 0.5% nebulizer solution 2.5 mg  2.5 mg Nebulization Q4H Amber Nydia Bouton, MD   2.5 mg at 04/30/11 0414  . DISCONTD: albuterol (PROVENTIL) (5 MG/ML) 0.5% nebulizer solution 2.5 mg  2.5 mg Nebulization Q4H Bishop Dublin, RRT      . DISCONTD: albuterol (PROVENTIL) (5 MG/ML) 0.5% nebulizer solution 2.5 mg  2.5 mg Nebulization QID Koren Bound Coro, RRT   2.5 mg at 05/02/11 1212  . DISCONTD: albuterol (PROVENTIL) (5 MG/ML) 0.5% nebulizer solution 2.5 mg  2.5 mg Nebulization TID Tobin Chad, MD      . DISCONTD: azithromycin (ZITHROMAX) 500 mg in dextrose 5 % 250 mL IVPB  500 mg Intravenous Q24H Ardyth Gal, MD   500 mg at 05/01/11 1629  . DISCONTD: ceFEPIme (MAXIPIME) 1 g in dextrose 5 % 50 mL IVPB  1 g Intravenous Q12H Ardyth Gal, MD   1 g at 05/02/11 1037  . DISCONTD: Darunavir Ethanolate (PREZISTA) tablet 800 mg  800 mg Oral Daily Randall Hiss, MD   800 mg at 05/02/11 1037  . DISCONTD: docusate sodium (COLACE) capsule 100 mg  100 mg Oral BID PRN Ardyth Gal, MD      .  DISCONTD: feeding supplement (ENSURE COMPLETE) liquid 237 mL  237 mL Oral BID BM Ailene Ards, RD   237 mL at 04/30/11 0918  . DISCONTD: feeding supplement (ENSURE) pudding 1 Container  1 Container Oral TID BM Hilarie Fredrickson, MD   1 Container at 05/02/11 1037  . DISCONTD: feeding supplement (RESOURCE BREEZE) liquid 1 Container  1 Container Oral TID BM Haynes Bast, RD   1 Container at 05/02/11 2025  . DISCONTD: iohexol (OMNIPAQUE) 300 MG/ML solution 100 mL  100 mL Intravenous Once PRN Medication Radiologist, MD      . DISCONTD: ipratropium (ATROVENT) nebulizer solution 0.5 mg  0.5 mg Nebulization Q4H Amber Nydia Bouton, MD   0.5 mg at 04/30/11 0414  . DISCONTD: ipratropium (ATROVENT) nebulizer solution 0.5 mg  0.5 mg Nebulization QID Bishop Dublin, RRT      . DISCONTD: ipratropium (ATROVENT) nebulizer solution 0.5 mg  0.5 mg Nebulization QID Koren Bound Coro, RRT   0.5 mg at 05/02/11 1212  . DISCONTD: ipratropium (ATROVENT) nebulizer solution 0.5 mg  0.5 mg Nebulization QID Tobin Chad, MD      . DISCONTD: methylPREDNISolone sodium succinate (SOLU-MEDROL) 125 mg/2 mL injection 125 mg  125 mg Intravenous Q6H Ardyth Gal, MD   125 mg at 05/05/11 0903  . DISCONTD: predniSONE (DELTASONE) tablet 40 mg  40 mg Oral Q breakfast Hilarie Fredrickson, MD   40 mg at 04/29/11 1143  . DISCONTD: predniSONE (DELTASONE) tablet 40 mg  40 mg Oral BID WC Randall Hiss, MD   40 mg at 04/30/11 6578  . DISCONTD: ritonavir (NORVIR) tablet 100 mg  100 mg Oral Daily Randall Hiss, MD   100 mg at 05/06/11 0933  . DISCONTD: sodium chloride 0.9 % bolus 1,000 mL  1,000 mL Intravenous Once Dessa Phi, MD      . DISCONTD: sulfamethoxazole-trimethoprim (BACTRIM DS) 800-160  MG per tablet 1 tablet  1 tablet Oral Daily Dessa Phi, MD   1 tablet at 04/28/11 1253  . DISCONTD: sulfamethoxazole-trimethoprim (BACTRIM DS) 800-160 MG per tablet 2 tablet  2 tablet Oral TID Herby Abraham, PHARMD    2 tablet at 04/29/11 0941  . DISCONTD: sulfamethoxazole-trimethoprim (BACTRIM) 255 mg in dextrose 5 % 250 mL IVPB  255 mg Intravenous Q8H Amber Nydia Bouton, MD   255 mg at 05/01/11 1206  . DISCONTD: sulfamethoxazole-trimethoprim (BACTRIM) 255 mg in dextrose 5 % 250 mL IVPB  255 mg Intravenous Q8H Tobin Chad, MD   255 mg at 05/05/11 0641  . DISCONTD: sulfamethoxazole-trimethoprim (BACTRIM,SEPTRA) 400-80 MG per tablet 2.5 tablet  2.5 tablet Oral Q6H Dessa Phi, MD       No current outpatient prescriptions on file as of 05/08/2011.    Home: Home Living Lives With: Family Available Help at Discharge: Family;Friend(s) Type of Home: House Home Access: Level entry Home Layout: Two level Alternate Level Stairs-Number of Steps: 12 Alternate Level Stairs-Rails: Left Bathroom Shower/Tub: Tub/shower unit;Curtain Bathroom Toilet: Standard Bathroom Accessibility: No Home Adaptive Equipment: None Additional Comments: Is alone for most of the morning  Functional History: Prior Function Able to Take Stairs?: No Driving: No Vocation: Unemployed Functional Status:  Mobility: Bed Mobility Bed Mobility: Yes Supine to Sit: 5: Supervision;HOB elevated (Comment degrees) Supine to Sit Details (indicate cue type and reason): Better activity tol on less O2 today Sitting - Scoot to Edge of Bed: 7: Independent Transfers Transfers: Yes Sit to Stand: 4: Min assist;With upper extremity assist;From bed Sit to Stand Details (indicate cue type and reason): Cues for safety, self-monitor; deep breathing Stand to Sit: 4: Min assist;With upper extremity assist;With armrests;To chair/3-in-1 Stand to Sit Details: cues for control Ambulation/Gait Ambulation/Gait: Yes Ambulation/Gait Assistance: 4: Min assist Ambulation/Gait Assistance Details (indicate cue type and reason): Cues for self monitor for activity tol; and to use RW t decr work of walking Aeronautical engineer (Feet): 20 Feet Assistive device:  Rolling walker    ADL: ADL Eating/Feeding: Simulated;Independent Where Assessed - Eating/Feeding: Bed level Grooming: Performed;Wash/dry face;Set up Grooming Details (indicate cue type and reason): Setup to gather grooming items. Where Assessed - Grooming: Sitting, bed Upper Body Bathing: Performed;Chest;Right arm;Left arm;Abdomen;Set up Upper Body Bathing Details (indicate cue type and reason): Pt's sats decreased quickly to 83% and required increase from 4L to 5L. Pt continuous use of pursed lip breathing throughout task, Where Assessed - Upper Body Bathing: Sitting, bed;Unsupported Lower Body Bathing: Performed;Minimal assistance Lower Body Bathing Details (indicate cue type and reason): Required increased time, ~12 minutes. Min assist for sit to stand during peri care. Pt required 4 rest breaks throughout task.  Decreased sats to 81%; had to increase O2 from 4L to 5L for ~2 min.  Where Assessed - Lower Body Bathing: Supported;Sit to stand from bed Upper Body Dressing: Simulated;Supervision/safety;Set up Upper Body Dressing Details (indicate cue type and reason): Pt can do this; however any exertion causes sats to decrease and pt does not recover quickly even with pursed lipped breathing; had to turn up O2 from 5 to 7 for about 1 minute. Where Assessed - Upper Body Dressing: Unsupported;Sitting, bed Lower Body Dressing: Performed;Supervision/safety Lower Body Dressing Details (indicate cue type and reason): Pt donned/doffed bil. socks  Where Assessed - Lower Body Dressing: Sitting, bed;Unsupported Toilet Transfer: Simulated;Minimal assistance Toilet Transfer Details (indicate cue type and reason): Bed to chair right beside bed Toilet Transfer Method: Stand pivot Toileting - Clothing Manipulation:  Simulated;Supervision/safety Toileting - Clothing Manipulation Details (indicate cue type and reason): Pt can do this; however any exertion causes sats to decrease and pt does not recover  quickly even with pursed lipped breathing; had to turn up O2 from 5 to 7 for about 1 minute. Where Assessed - Toileting Clothing Manipulation: Standing Toileting - Hygiene: Simulated;Supervision/safety Toileting - Hygiene Details (indicate cue type and reason): Pt can do this; however any exertion causes sats to decrease and pt does not recover quickly even with pursed lipped breathing; had to turn up O2 from 5 to 7 for about 1 minute. Where Assessed - Toileting Hygiene: Sit on 3-in-1 or toilet Tub/Shower Transfer: Not assessed Tub/Shower Transfer Method: Not assessed Equipment Used:  (None--only up to chair) Ambulation Related to ADLs: Unable due to fatigue ADL Comments: Pt self initiating rest breaks and use of pursed lipped breathing techniques.  Cognition: Cognition Arousal/Alertness: Awake/alert Orientation Level: Oriented X4 Cognition Arousal/Alertness: Awake/alert Overall Cognitive Status: Appears within functional limits for tasks assessed Orientation Level: Oriented X4  Blood pressure 114/74, pulse 95, temperature 98.6 F (37 C), temperature source Oral, resp. rate 19, height 6' (1.829 m), weight 50.894 kg (112 lb 3.2 oz), SpO2 97.00%. Physical Exam  Constitutional: He is oriented to person, place, and time.       Frail African American male appears older than stated age.  HENT:  Head: Normocephalic.  Neck: No thyromegaly present.  Cardiovascular: Regular rhythm.   Pulmonary/Chest:       Decreased breath sounds at the bases.  Abdominal: He exhibits no distension. There is no tenderness.  Musculoskeletal: He exhibits no edema.  Neurological: He is alert and oriented to person, place, and time.  Skin: Skin is warm and dry.  Psychiatric: He has a normal mood and affect.   motor strength is 5/5 in bilateral deltoid, biceps, triceps, grip 5/5 in bilateral hip flexion, knee extension, ankle dorsiflexion.  Sensation is intact to light touch in bilateral upper and lower  extremities Speech shows no evidence of dysarthria or aphasia Tone is normal  Results for orders placed during the hospital encounter of 04/27/11 (from the past 24 hour(s))  CBC     Status: Abnormal   Collection Time   05/07/11  7:17 AM      Component Value Range   WBC 7.6  4.0 - 10.5 (K/uL)   RBC 2.96 (*) 4.22 - 5.81 (MIL/uL)   Hemoglobin 9.2 (*) 13.0 - 17.0 (g/dL)   HCT 82.9 (*) 56.2 - 52.0 (%)   MCV 89.5  78.0 - 100.0 (fL)   MCH 31.1  26.0 - 34.0 (pg)   MCHC 34.7  30.0 - 36.0 (g/dL)   RDW 13.0  86.5 - 78.4 (%)   Platelets 361  150 - 400 (K/uL)  BASIC METABOLIC PANEL     Status: Abnormal   Collection Time   05/07/11  7:17 AM      Component Value Range   Sodium 133 (*) 135 - 145 (mEq/L)   Potassium 4.4  3.5 - 5.1 (mEq/L)   Chloride 106  96 - 112 (mEq/L)   CO2 20  19 - 32 (mEq/L)   Glucose, Bld 111 (*) 70 - 99 (mg/dL)   BUN 20  6 - 23 (mg/dL)   Creatinine, Ser 6.96  0.50 - 1.35 (mg/dL)   Calcium 8.4  8.4 - 29.5 (mg/dL)   GFR calc non Af Amer >90  >90 (mL/min)   GFR calc Af Amer >90  >90 (mL/min)  No results found.  Assessment/Plan: Diagnosis: Deconditioning with recent community-acquired pneumonia in this setting of HIV 1. Does the need for close, 24 hr/day medical supervision in concert with the patient's rehab needs make it unreasonable for this patient to be served in a less intensive setting? No 2. Co-Morbidities requiring supervision/potential complications: Hypoxia with pulmonary infiltrates 3. Due to bladder management, bowel management, skin/wound care, disease management, medication administration and patient education, does the patient require 24 hr/day rehab nursing? Potentially 4. Does the patient require coordinated care of a physician, rehab nurse, PT (0.5-1 hrs/day, 5 days/week) and OT (0.5-1 hrs/day, 5 days/week) to address physical and functional deficits in the context of the above medical diagnosis(es)? No Addressing deficits in the following areas: balance,  endurance, locomotion, strength, transferring, bathing and dressing 5. Can the patient actively participate in an intensive therapy program of at least 3 hrs of therapy per day at least 5 days per week? No 6. The potential for patient to make measurable gains while on inpatient rehab is fair 7. Anticipated functional outcomes upon discharge from inpatient rehab are supervision mobility with PT, supervision ADLs with OT, not applicable with SLP. 8. Estimated rehab length of stay to reach the above functional goals is: Three-week SNF stay 9. Does the patient have adequate social supports to accommodate these discharge functional goals? Potentially 10. Anticipated D/C setting: Home 11. Anticipated post D/C treatments: HH therapy 12. Overall Rehab/Functional Prognosis: fair  RECOMMENDATIONS: This patient's condition is appropriate for continued rehabilitative care in the following setting: SNF Patient has agreed to participate in recommended program. Potentially Note that insurance prior authorization may be required for reimbursement for recommended care.  Comment: Patient is too high level for CIR and also would require a lower intensity program based on his medical comorbidities   ANGIULLI,DANIEL J. 05/08/2011

## 2011-05-09 MED ORDER — MAGIC MOUTHWASH W/LIDOCAINE
5.0000 mL | Freq: Three times a day (TID) | ORAL | Status: DC | PRN
  Filled 2011-05-09: qty 5

## 2011-05-09 MED ORDER — AZITHROMYCIN 600 MG PO TABS
1200.0000 mg | ORAL_TABLET | ORAL | Status: DC
  Administered 2011-05-09: 1200 mg via ORAL
  Filled 2011-05-09: qty 2

## 2011-05-09 MED ORDER — FLUCONAZOLE 200 MG PO TABS
200.0000 mg | ORAL_TABLET | Freq: Every day | ORAL | Status: DC
  Administered 2011-05-09 – 2011-05-10 (×2): 200 mg via ORAL
  Filled 2011-05-09 (×2): qty 1

## 2011-05-09 MED ORDER — PREDNISONE 20 MG PO TABS
40.0000 mg | ORAL_TABLET | Freq: Every day | ORAL | Status: DC
  Administered 2011-05-10 – 2011-05-12 (×3): 40 mg via ORAL
  Filled 2011-05-09 (×4): qty 2

## 2011-05-09 NOTE — Progress Notes (Signed)
Family Medicine Teaching Service Attending Note  I interviewed and examined patient Daniel House and reviewed their tests and x-rays.  I discussed with Dr. Mikel Cella and reviewed their note for today.  I agree with their assessment and plan.     Additionally  Headache has resolved Complains of dysphagia = thrush Not yet medically stable for SNF - hopefully in a few days if his pulmonary function imprves

## 2011-05-09 NOTE — Progress Notes (Signed)
Rehab admissions - Evaluated for possible admission.  Please see rehab consult done by Dr. Wynn Banker yesterday.  Patient is doing well with therapies and does not need an intense program of 3 hours per day on rehab.  Recommend SNF per Dr. Wynn Banker.  Call me for questions.  #[phone]

## 2011-05-09 NOTE — Progress Notes (Signed)
Patient ID: Daniel House, male   DOB: October 12, 1975, 36 y.o.   MRN: [id number]  Daily Progress Note Family Medicine Teaching Service Service Pager: [phone]  Subjective:  Patient complaining of a "burning" in his throat and esophagus when he swallows. Denies any headaches. Breathing stable.  I have reviewed the patient's medications.  Objective Temp:  [98 F (36.7 C)-98.9 F (37.2 C)] 98.7 F (37.1 C) (04/19 0502) Pulse Rate:  [84-91] 84  (04/19 0502) Resp:  [19-20] 20  (04/19 0502) BP: (116-135)/(73-81) 135/80 mmHg (04/19 0502) SpO2:  [95 %-100 %] 98 % (04/19 0502) Weight:  [112 lb 9.6 oz (51.075 kg)] 112 lb 9.6 oz (51.075 kg) (04/18 2159)   Intake/Output Summary (Last 24 hours) at 05/09/11 0754 Last data filed at 05/09/11 0746  Gross per 24 hour  Intake   1860 ml  Output   1650 ml  Net    210 ml   General: awake, alert, lying in bed, seems more uncomfortable. [place] in place on 5L HEENT: AT/[place], sclera white, MMM. Large white plaques of soft palate and buccal mucosa. Lymph: no cervical or supraclavicular LAD CV: RRR, no murmurs Pulm: CTAB  Abd:  Soft, +BS, NTND Ext: mild nonpitting edema of feet bilaterally  Neuro: alert, oriented  Labs and Imaging  Lab 05/08/11 0520 05/07/11 0717 05/06/11 0500  WBC 7.8 7.6 6.1  HGB 9.0* 9.2* 7.2*  HCT 26.3* 26.5* 21.3*  PLT 346 361 387    Lab 05/08/11 0520 05/07/11 0717 05/06/11 0500 05/03/11 0500  NA 133* 133* 134* --  K 4.1 4.4 4.3 --  CL 104 106 105 --  CO2 20 20 20  --  BUN 16 20 24* --  CREATININE 0.82 0.95 0.96 --  CALCIUM 8.2* 8.4 8.3* --  PROT -- -- -- 6.1  BILITOT -- -- -- 0.2*  ALKPHOS -- -- -- 125*  ALT -- -- -- 17  AST -- -- -- 54*  GLUCOSE 129* 111* 147* --   TSH 0.792 ACE 38 CD4 20 HIV Quant 837,756 LDH 715 Crytpo Neg RPR+, Titer 1:16   Assessment and Plan 36 year old Male with no significant PMHx who presents with 1 week of dyspnea, found to be tachycardic and hypoxic in ER, imagining concerning for  interstitial lung disease:   1. Pulmonary - Imaging and clinical picture concerning for PCP pneumonia, or atypical infection. Transferred to SDU on 04/30/11 due to worsening respiratory status. Respiratory status seems stable, but when ambulating yesterday still required venturi mask to keep oxygen sats up. Transferred back to tele floor on 05/04/11. Overall patient is continue to improve - HIV positive. CD4 count 20. HIV viral load 837K. - ID consulted. We appreciate their excellent care of this patient, as well as arranging for patient to receive HIV meds. - RPR came back reactive, with titer of 1:16.  Confirmation test sent, with TP-PA >8.0.  There is an increased incidence of false + RPR in HIV patients, but given patient's history of syphilis, treated with Penicillin on Monday, and weekly for total of 3 weeks. - Patient has been on Bactrim IV 15 mg/kg of TMP divided q8 hours on started 04/29/11 after not improving on PO. This is day #12 of antibiotics. Transitioned back to PO Bactrim on 05/05/14. Tolerating well - Transitioned to Prednisone from Solumedrol (day #10). Will begin steroid taper soon - Antiretroviral therapy per ID. Patient has been approved for outpatient therapy and will be able to receive his medications at time of discharge. -  Continue O2 via [place]. Will start humidified air, as well as saline spray as needed. Continue to wean as necessary - Will switch neb treatments to prn Albuterol only. Patient encouraged to ask for treatments as needed - [name], whose phone number is listed in patient's room if needed. - Headache resolved from yesterday.  - Patient has done really well with PT/OT and making improvement. CIR consulted and will evaluate patient again this afternoon. Await their evaluation.  2. Acute renal insufficiency- Noted on admission; may be due to dehydration +/- NSAID use, but again concerning for pulmonary-renal cause of illness or  HIVAN. UA significant for spec grav at 1.040, 100 of protein, and red cells on admission. Resolved. - Creatinine continues to improve.   - IVF @ 41 cc's/hr, patient has good urine output and normotensive.  Will continue for now pending appetite improvement/fluid intake.   3. Anemia- HgB improved. No signs of active bleeding. S/p 1 unit PRBC on 05/06/11. Hgb 9.2 post-transfusion - Discussed his HgB with patient . States he is not having frequent stools. Denies dark/bloody bowel movement.  - Continue Iron - Continue to monitor. Repeat CBC tomorrow. Can consider repeat transfusion if patient remains symptomatic.  4. Yeast infection- Patient with thrush and complaining of pain when he swallows. - Fluconazole 200mg  PO daily - Magic mouthwash with lidocaine for symptomatic relief. - Perhaps he will have increased PO intake when this is treated  5. FEN/GI- patient appears malnourished, albumin low at 2.1. Will allow regular diet, give zofran prn, and protonix for GI prophylaxis. NS decrease to 75 cc/hr. Miralax as needed for constipation. - Continue Remeron to help with appetite.  - Encourage patient to eat whatever he wants. Continue to monitor I/O and daily weights. - Nutrition following  6. DVT ppx- SQ heparin   7. Disposition- Pending further clinical improvement. Continue to follow ID recommendations. Awaiting CIR recommendations  [name], Louay Myrie Pager: [phone] 05/09/2011, 7:54 AM

## 2011-05-09 NOTE — Progress Notes (Addendum)
Clinical Child psychotherapist (CSW) received a bed offer from [facility] in [place] contingent that his medications be paid for as the facility will not pay for his medications. CSW spoke with Selena Batten the bridge counselor who stated pt has been approved adep and his medications will be taken care. Pt will be able to take his medications with him to the facility. CSW will follow up with pt today and with facility next week when pt stable for dc.  Theresia Bough, MSW, Theresia Majors [phone]

## 2011-05-09 NOTE — Progress Notes (Signed)
Nutrition Follow-up  Intake of meals variable, eating mostly 50%. Pt states his intake is not better because he does not like the food here. Discussed snacks, pt declined. Pt with cereal and peanut butter on tray table, so is consuming some snacks throughout the day. Pt does not like supplements that have been offered in the past (Ensure and [place]agar).  Continues on Remeron for appetite.  Diet Order:  Regular  Meds: Scheduled Meds:   . antiseptic oral rinse  15 mL Mouth Rinse BID  . darunavir  800 mg Oral Q breakfast  . docusate sodium  100 mg Oral BID  . emtricitabine-tenofovir  1 tablet Oral Daily  . ferrous sulfate  325 mg Oral TID WC  . fluconazole  200 mg Oral Daily  . heparin  5,000 Units Subcutaneous Q8H  . mirtazapine  15 mg Oral QHS  . penicillin g benzathine (BICILLIN-LA) IM  2.4 Million Units Intramuscular Weekly  . predniSONE  40 mg Oral BID WC  . promethazine  12.5 mg Intravenous Once  . ritonavir  100 mg Oral Q breakfast  . sodium chloride  3 mL Intravenous Q12H  . sulfamethoxazole-trimethoprim  2 tablet Oral Q8H  . DISCONTD: albuterol  2.5 mg Nebulization TID  . DISCONTD: ipratropium  0.5 mg Nebulization TID   Continuous Infusions:   . sodium chloride 75 mL/hr at 05/09/11 1015   PRN Meds:.acetaminophen, acetaminophen, albuterol, albuterol, alum & mag hydroxide-simeth, benzonatate, diphenhydrAMINE, magic mouthwash w/lidocaine, ondansetron (ZOFRAN) IV, ondansetron, oxyCODONE, polyethylene glycol, sodium chloride, zolpidem  Labs:  CMP     Component Value Date/Time   NA 133* 05/08/2011 0520   K 4.1 05/08/2011 0520   CL 104 05/08/2011 0520   CO2 20 05/08/2011 0520   GLUCOSE 129* 05/08/2011 0520   BUN 16 05/08/2011 0520   CREATININE 0.82 05/08/2011 0520   CALCIUM 8.2* 05/08/2011 0520   PROT 6.1 05/03/2011 0500   ALBUMIN 1.8* 05/03/2011 0500   AST 54* 05/03/2011 0500   ALT 17 05/03/2011 0500   ALKPHOS 125* 05/03/2011 0500   BILITOT 0.2* 05/03/2011 0500   GFRNONAA >90  05/08/2011 0520   GFRAA >90 05/08/2011 0520     Intake/Output Summary (Last 24 hours) at 05/09/11 1021 Last data filed at 05/09/11 0936  Gross per 24 hour  Intake   1860 ml  Output   1700 ml  Net    160 ml    Weight Status:  51.1 kg (stable)  Estimated needs:  1500 - 1700 kcal, 70 - 80 grams protein  Nutrition Dx:  Inadequate oral intake r/t poor appetite AEB pt report. Ongoing.  Goal:  Meet >/=90% of estimated nutrition needs to promote energy and protein repletion.  Intervention:   1. Encouraged bland foods and variety in intake. Will continue to follow. Encourage meals as able. 2. If intake does not continue to improve and more aggressive nutrition support desired, recommend placing enteral feeding tube and initiating Osmolite 1.2 at 20 ml/hr, advance 10 ml/hr every 8 hours (or as tolerated) to goal of 60 ml/hr. This will provide: 1728 kcal, 80 grams protein, 1180 ml free water. Pt may be at risk for refeeding syndrome given dx of severe malnutrition. Recommend monitoring potassium, phosphorus, and magnesium. Replete prn. 3. RD to continue to follow  Monitor:  Weights, labs, PO intake, I/O's  Adair Laundry Pager #:  [phone]

## 2011-05-09 NOTE — Progress Notes (Signed)
PT TREATMENT NOTE  05/09/11 1445  PT Visit Information  Last PT Received On 05/09/11  PT Time Calculation  PT Start Time 0936  PT Stop Time 1014  PT Time Calculation (min) 38 min  Precautions  Precautions Fall  Restrictions  Weight Bearing Restrictions No  Other Position/Activity Restrictions Desats considerably with activity, even with supplemental O2  Cognition  Overall Cognitive Status Appears within functional limits for tasks assessed/performed  Arousal/Alertness Awake/alert  Behavior During Session [facility] for tasks performed  Bed Mobility  Bed Mobility Supine to Sit  Supine to Sit 5: Supervision;With rails  Sitting - Scoot to Edge of Bed 7: Independent  Details for Bed Mobility Assistance safe terchnique  Transfers  Transfers Sit to Stand  Sit to Stand 4: Min assist;With upper extremity assist;From bed  Stand to Sit 4: Min assist;With upper extremity assist;With armrests;To chair/3-in-1  Details for Transfer Assistance vc's for safe technique and hand placement  Ambulation/Gait  Ambulation/Gait Assistance 4: Min assist  Ambulation Distance (Feet) 40 Feet  Assistive device Rolling walker  Ambulation/Gait Assistance Details good gait dynamics  Gait Pattern Step-through pattern  PT - End of Session  Equipment Utilized During Treatment Gait belt  Activity Tolerance Treatment limited secondary to medical complications (Comment);Other (comment) (limited by significant drop in O2 sats even with portable O2)  Patient left in chair;with call bell/phone within reach  Nurse Communication Other (comment) (drop in O2 sats and associated numerical values)  PT - Assessment/Plan  Comments on Treatment Session Slowly improving with activity tolerancs.  His O2 sats at rest on 4L O2 was 89%; decreased to 78 on 6LO2 during ambulation (tank had no 5L value); Once returned to room and placed back on wall O2, pt. came up to 90% O2 sats after rest.  RN Toma Copier made aware of these values.    PT  Frequency Min 3X/week  Follow Up Recommendations Inpatient Rehab  Equipment Recommended Defer to next venue  Acute Rehab PT Goals  PT Goal: Supine/Side to Sit - Progress Progressing toward goal  PT Goal: Sit to Stand - Progress Progressing toward goal  PT Goal: Stand to Sit - Progress Progressing toward goal  PT Goal: Ambulate - Progress Progressing toward goal   Weldon Picking PT Acute Rehab Services [phone] Bee[place] [phone]

## 2011-05-09 NOTE — Progress Notes (Signed)
Clinical Child psychotherapist (CSW) vidited pt room and informed him of potential bed offer at [facility] in [place]. Pt is agreeable and appreciative of placement offer.  Theresia Bough, MSW, Theresia Majors [phone]

## 2011-05-09 NOTE — Progress Notes (Signed)
Subjective: Pt developed thrush and odynophagia  Antibiotics:  Anti-infectives     Start     Dose/Rate Route Frequency Ordered Stop   05/09/11 1800   azithromycin (ZITHROMAX) tablet 1,200 mg        1,200 mg Oral Weekly 05/09/11 1535     05/09/11 1200   fluconazole (DIFLUCAN) tablet 200 mg        200 mg Oral Daily 05/09/11 0921     05/07/11 0800   ritonavir (NORVIR) tablet 100 mg        100 mg Oral Daily with breakfast 05/06/11 1010     05/05/11 1400   penicillin g benzathine (BICILLIN LA) 1200000 UNIT/2ML injection 2.4 Million Units        2.4 Million Units Intramuscular Weekly 05/05/11 1224 05/26/11 1359   05/05/11 1400  sulfamethoxazole-trimethoprim (BACTRIM DS) 800-160 MG per tablet 2 tablet       2 tablet Oral 3 times per day 05/05/11 1225     05/03/11 1100   Darunavir Ethanolate (PREZISTA) tablet 800 mg        800 mg Oral Daily with breakfast 05/03/11 0801     05/02/11 1000   Darunavir Ethanolate (PREZISTA) tablet 800 mg  Status:  Discontinued        800 mg Oral Daily 05/02/11 0928 05/03/11 0801   05/02/11 1000   ritonavir (NORVIR) tablet 100 mg  Status:  Discontinued        100 mg Oral Daily 05/02/11 0928 05/06/11 1010   05/02/11 1000   emtricitabine-tenofovir (TRUVADA) 200-300 MG per tablet 1 tablet        1 tablet Oral Daily 05/02/11 0928     05/01/11 2000   sulfamethoxazole-trimethoprim (BACTRIM) 255 mg in dextrose 5 % 250 mL IVPB  Status:  Discontinued        255 mg 265.9 mL/hr over 60 Minutes Intravenous Every 8 hours 05/01/11 1908 05/05/11 1225   04/30/11 1600   azithromycin (ZITHROMAX) 500 mg in dextrose 5 % 250 mL IVPB  Status:  Discontinued        500 mg 250 mL/hr over 60 Minutes Intravenous Every 24 hours 04/30/11 1455 05/02/11 1044   04/30/11 1600   ceFEPIme (MAXIPIME) 1 g in dextrose 5 % 50 mL IVPB  Status:  Discontinued        1 g 100 mL/hr over 30 Minutes Intravenous Every 12 hours 04/30/11 1455 05/02/11 1044   04/29/11 1100    sulfamethoxazole-trimethoprim (BACTRIM) 255 mg in dextrose 5 % 250 mL IVPB  Status:  Discontinued        255 mg 265.9 mL/hr over 60 Minutes Intravenous Every 8 hours 04/29/11 1008 05/01/11 1909   04/28/11 1600   sulfamethoxazole-trimethoprim (BACTRIM DS) 800-160 MG per tablet 2 tablet  Status:  Discontinued        2 tablet Oral 3 times daily 04/28/11 1348 04/29/11 1008   04/28/11 1330   sulfamethoxazole-trimethoprim (BACTRIM,SEPTRA) 400-80 MG per tablet 2.5 tablet  Status:  Discontinued        2.5 tablet Oral 4 times per day 04/28/11 1224 04/28/11 1348   04/28/11 1100   sulfamethoxazole-trimethoprim (BACTRIM DS) 800-160 MG per tablet 1 tablet  Status:  Discontinued        1 tablet Oral Daily 04/28/11 1046 04/28/11 1224   04/27/11 2345   cefTRIAXone (ROCEPHIN) 1 g in dextrose 5 % 50 mL IVPB        1 g 100 mL/hr over 30 Minutes Intravenous  Once  04/27/11 2338 04/28/11 0028   04/27/11 2345   azithromycin (ZITHROMAX) 500 mg in dextrose 5 % 250 mL IVPB        500 mg 250 mL/hr over 60 Minutes Intravenous  Once 04/27/11 2338 04/28/11 0128          Medications: Scheduled Meds:    . antiseptic oral rinse  15 mL Mouth Rinse BID  . azithromycin  1,200 mg Oral Weekly  . darunavir  800 mg Oral Q breakfast  . docusate sodium  100 mg Oral BID  . emtricitabine-tenofovir  1 tablet Oral Daily  . ferrous sulfate  325 mg Oral TID WC  . fluconazole  200 mg Oral Daily  . heparin  5,000 Units Subcutaneous Q8H  . mirtazapine  15 mg Oral QHS  . penicillin g benzathine (BICILLIN-LA) IM  2.4 Million Units Intramuscular Weekly  . predniSONE  40 mg Oral Q breakfast  . ritonavir  100 mg Oral Q breakfast  . sodium chloride  3 mL Intravenous Q12H  . sulfamethoxazole-trimethoprim  2 tablet Oral Q8H  . DISCONTD: albuterol  2.5 mg Nebulization TID  . DISCONTD: ipratropium  0.5 mg Nebulization TID  . DISCONTD: predniSONE  40 mg Oral BID WC   Continuous Infusions:    . sodium chloride 75 mL/hr at  05/09/11 1015   PRN Meds:.acetaminophen, albuterol, albuterol, alum & mag hydroxide-simeth, benzonatate, diphenhydrAMINE, magic mouthwash w/lidocaine, ondansetron, oxyCODONE, polyethylene glycol, zolpidem, DISCONTD: acetaminophen, DISCONTD: magic mouthwash w/lidocaine, DISCONTD: ondansetron (ZOFRAN) IV, DISCONTD: sodium chloride   Objective: Weight change: 6.4 oz (0.181 kg)  Intake/Output Summary (Last 24 hours) at 05/09/11 1711 Last data filed at 05/09/11 1336  Gross per 24 hour  Intake   1500 ml  Output   2175 ml  Net   -675 ml   Blood pressure 124/88, pulse 94, temperature 98.5 F (36.9 C), temperature source Oral, resp. rate 18, height 6' (1.829 m), weight 112 lb 9.6 oz (51.075 kg), SpO2 96.00%. Temp:  [98.4 F (36.9 C)-98.9 F (37.2 C)] 98.5 F (36.9 C) (04/19 1335) Pulse Rate:  [84-98] 94  (04/19 1335) Resp:  [18-20] 18  (04/19 1335) BP: (110-135)/(73-88) 124/88 mmHg (04/19 1335) SpO2:  [90 %-100 %] 96 % (04/19 1335) Weight:  [112 lb 9.6 oz (51.075 kg)] 112 lb 9.6 oz (51.075 kg) (04/18 2159)  Physical Exam: General: Alert and awake, oriented x3, a HEENT: anicteric sclera, thrush  CVS, normal r,  no murmur rubs or gallops Chest: more clear Abdomen: soft nontender, nondistended, normal bowel sounds, Extremities: no  clubbing or edema noted bilaterally Skin: no rashes  Neuro: nonfocal  Lab Results:  Basename 05/08/11 0520 05/07/11 0717  WBC 7.8 7.6  HGB 9.0* 9.2*  HCT 26.3* 26.5*  PLT 346 361    BMET  Basename 05/08/11 0520 05/07/11 0717  NA 133* 133*  K 4.1 4.4  CL 104 106  CO2 20 20  GLUCOSE 129* 111*  BUN 16 20  CREATININE 0.82 0.95  CALCIUM 8.2* 8.4    Micro Results: Recent Results (from the past 240 hour(s))  CULTURE, SPUTUM-ASSESSMENT     Status: Normal   Collection Time   05/05/11 12:05 PM      Component Value Range Status Comment   Specimen Description SPUTUM   Final    Special Requests NONE   Final    Sputum evaluation     Final     Value: THIS SPECIMEN IS ACCEPTABLE. RESPIRATORY CULTURE REPORT TO FOLLOW.   Report Status 05/05/2011  FINAL   Final   CULTURE, RESPIRATORY     Status: Normal   Collection Time   05/05/11 12:05 PM      Component Value Range Status Comment   Specimen Description SPUTUM   Final    Special Requests NONE   Final    Gram Stain     Final    Value: RARE WBC PRESENT,BOTH PMN AND MONONUCLEAR     NO SQUAMOUS EPITHELIAL CELLS SEEN     NO ORGANISMS SEEN   Culture NORMAL OROPHARYNGEAL FLORA   Final    Report Status 05/07/2011 FINAL   Final     Studies/Results: No results found.    Assessment/Plan: Daniel House is a 36 y.o. male with  Newly diagnosed HIV/AIDS, and PCP pneumonia:   1) PCP Pneumonia: He is slow to improve from resp standpoint. Kennon Portela has premature COPD underlying this PCP pneumonia --changed to bactrim to high dose PO and  Prednisone --will need 21 day course of anti PCP therapy with steroids -drop to 40mg  of prednisone   2) Thrush: --agree with fluconazole and would be reasonable to keep on this while he is on steroids and high dose bactrim --could drop dose to 100mg  after 200mg  load  2) Syphilis; has had years ago. Possibly reinfected. Neurosyphilis could also be concern> Will give him IM PCN today and weekly x 3 doses and follow titers   3) HIV/AIDS:   --continue prezista/norvir and truvada -- per [employer] Poff  Pt  Has ADAP approval --azithromycin weekly for MAI prophylaxis  5) Headache retro-orbital: If he has fevers OR if this recurss without explanation would get LP with: --opening pressure --stat cryptococcal ag on CSF --csf cell count and differential -csf VDRL -csf FTA-abs -csf protein and glucose --csf cultures --csf for HSV by PCR, CMV PCR and save remaining CSF (for studies such as   toxo pcr, ebv pcr, JC virus should he had cns lesions on imaging suggesting toxo, primary cns lymphoma or PML)    Dr. Drue Second will be covering this weekend and  is available for questions.       LOS: 12 days   Acey Lav 05/09/2011, 5:11 PM

## 2011-05-10 LAB — CBC
Platelets: 288 10*3/uL (ref 150–400)
RBC: 3.05 MIL/uL — ABNORMAL LOW (ref 4.22–5.81)
RDW: 15.7 % — ABNORMAL HIGH (ref 11.5–15.5)
WBC: 8.7 10*3/uL (ref 4.0–10.5)

## 2011-05-10 LAB — BASIC METABOLIC PANEL
Calcium: 8.2 mg/dL — ABNORMAL LOW (ref 8.4–10.5)
GFR calc non Af Amer: >90 mL/min (ref >90)
Glucose, Bld: 81 mg/dL (ref 70–99)
Sodium: 131 mEq/L — ABNORMAL LOW (ref 135–145)

## 2011-05-10 MED ORDER — FLUCONAZOLE 100 MG PO TABS
100.0000 mg | ORAL_TABLET | Freq: Every day | ORAL | Status: DC
  Administered 2011-05-11 – 2011-05-15 (×5): 100 mg via ORAL
  Filled 2011-05-10 (×5): qty 1

## 2011-05-10 MED ORDER — CAMPHOR-MENTHOL 0.5-0.5 % EX LOTN
TOPICAL_LOTION | CUTANEOUS | Status: DC | PRN
  Filled 2011-05-10: qty 222

## 2011-05-10 NOTE — Progress Notes (Signed)
Family Medicine Teaching Service Attending Note  I discussed patient Daniel House  with Dr. Hairford and reviewed their note for today.  I agree with their assessment and plan.      

## 2011-05-10 NOTE — Progress Notes (Signed)
Patient ID: Daniel House, male   DOB: [birth year], 36 y.o.   MRN: [id number]  Daily Progress Note Family Medicine Teaching Service Service Pager: [phone]  Subjective:  Patient states he is feeling much better today. Still requires 4L oxygen but is using IS and getting OOB to chair. No acute events overnight.  I have reviewed the patient's medications.  Objective Temp:  [98 F (36.7 C)-98.5 F (36.9 C)] 98 F (36.7 C) (04/20 0404) Pulse Rate:  [89-98] 89  (04/20 0404) Resp:  [18] 18  (04/20 0404) BP: (111-124)/(69-97) 122/97 mmHg (04/20 0404) SpO2:  [96 %-99 %] 98 % (04/20 0404) Weight:  [110 lb 4.8 oz (50.032 kg)] 110 lb 4.8 oz (50.032 kg) (04/19 2144)   Intake/Output Summary (Last 24 hours) at 05/10/11 0939 Last data filed at 05/10/11 0847  Gross per 24 hour  Intake 1247.5 ml  Output   3875 ml  Net -2627.5 ml   General: awake, alert, sitting up in bed. [place] in place at 4L HEENT: AT/[place], sclera white, MMM. Large white plaques of soft palate and buccal mucosa. Lymph: no cervical or supraclavicular LAD CV: RRR, no murmurs Pulm: CTAB  Abd:  Soft, +BS, NTND Ext: mild nonpitting edema of feet bilaterally  Neuro: alert, oriented  Labs and Imaging  Lab 05/10/11 0605 05/08/11 0520 05/07/11 0717  WBC 8.7 7.8 7.6  HGB 9.2* 9.0* 9.2*  HCT 27.0* 26.3* 26.5*  PLT 288 346 361    Lab 05/10/11 0605 05/08/11 0520 05/07/11 0717  NA 131* 133* 133*  K 4.5 4.1 4.4  CL 102 104 106  CO2 21 20 20   BUN 11 16 20   CREATININE 0.80 0.82 0.95  CALCIUM 8.2* 8.2* 8.4  PROT -- -- --  BILITOT -- -- --  ALKPHOS -- -- --  ALT -- -- --  AST -- -- --  GLUCOSE 81 129* 111*   TSH 0.792 ACE 38 CD4 20 HIV Quant 837,756 LDH 715 Crytpo Neg RPR+, Titer 1:16   Assessment and Plan 36 year old Male with no significant PMHx who presents with 1 week of dyspnea, found to be tachycardic and hypoxic in ER, imagining concerning for interstitial lung disease:   1. Pulmonary - Imaging and clinical  picture concerning for PCP pneumonia, or atypical infection. Transferred to SDU on 04/30/11 due to worsening respiratory status. Transferred back to tele floor on 05/04/11. Telemetry monitoring d/c on 05/10/11. Overall patient is continue to improve - HIV positive. CD4 count 20. HIV viral load 837K. - ID consulted. We appreciate their excellent care of this patient, as well as arranging for patient to receive HIV meds. - RPR came back reactive, with titer of 1:16.  Confirmation test sent, with TP-PA >8.0.  There is an increased incidence of false + RPR in HIV patients, but given patient's history of syphilis, treated with Penicillin on Monday, and weekly for total of 3 weeks. - Patient has been on Bactrim IV 15 mg/kg of TMP divided q8 hours on started 04/29/11 after not improving on PO. This is day #13 of antibiotics. Transitioned back to PO Bactrim on 05/05/14. Tolerating well - Transitioned to Prednisone from Solumedrol (day #11). Steroid taper started on day #10 to 40mg  daily. - Antiretroviral therapy per ID. Patient has been approved for outpatient therapy and will be able to receive his medications at time of discharge. - Continue O2 via [place]. Will start humidified air, as well as saline spray as needed. Continue to wean as necessary. Continue IS -  Will switch neb treatments to prn Albuterol only. Patient encouraged to ask for treatments as needed - [name], whose phone number is listed in patient's room if needed. - Headache resolved - Patient has done really well with PT/OT and making improvement. Not a candidate for CIR  2. Acute renal insufficiency- Noted on admission; may be due to dehydration +/- NSAID use, but again concerning for pulmonary-renal cause of illness or HIVAN. UA significant for spec grav at 1.040, 100 of protein, and red cells on admission. Resolved. - Creatinine continues to improve.   - IVF @ 76 cc's/hr, patient has good urine output and  normotensive.  Will continue for now pending appetite improvement/fluid intake, but anticipate KVO in next few days  3. Anemia- HgB improved. No signs of active bleeding. S/p 1 unit PRBC on 05/06/11. Hgb 9.2 post-transfusion - Discussed his HgB with patient . States he is not having frequent stools. Denies dark/bloody bowel movement.  - Continue Iron - Continue to monitor. Repeat CBC tomorrow. Can consider repeat transfusion if patient remains symptomatic.  4. Yeast infection- Patient with thrush and complaining of pain when he swallows. - Fluconazole PO started on 05/09/11 with 200mg  load and then continued on 100mg  daily - Magic mouthwash with lidocaine for symptomatic relief. (This has maalox, nystatin and benadryl in it which should help) - Perhaps he will have increased PO intake when this is treated  5. FEN/GI- patient appears malnourished, albumin low at 2.1. Will allow regular diet, give zofran prn, and protonix for GI prophylaxis. NS decrease to 75 cc/hr. Miralax as needed for constipation. - Continue Remeron to help with appetite.  - Encourage patient to eat whatever he wants. Continue to monitor I/O and daily weights. - Nutrition following  6. DVT ppx- SQ heparin   7. Disposition- Pending further clinical improvement. Continue to follow ID recommendations. Patient has SNF bed offer as of yesterday, but patient not medically stable for discharge at this time. Continue to monitor.  [name], Kyshon Tolliver Pager: [phone] 05/10/2011, 9:39 AM

## 2011-05-11 LAB — BASIC METABOLIC PANEL
Chloride: 103 mEq/L (ref 96–112)
GFR calc Af Amer: >90 mL/min (ref >90)
Potassium: 4.3 mEq/L (ref 3.5–5.1)
Sodium: 133 mEq/L — ABNORMAL LOW (ref 135–145)

## 2011-05-11 LAB — CBC
HCT: 26.3 % — ABNORMAL LOW (ref 39.0–52.0)
Hemoglobin: 8.9 g/dL — ABNORMAL LOW (ref 13.0–17.0)
RBC: 2.97 MIL/uL — ABNORMAL LOW (ref 4.22–5.81)
RDW: 16.2 % — ABNORMAL HIGH (ref 11.5–15.5)
WBC: 6.3 10*3/uL (ref 4.0–10.5)

## 2011-05-11 LAB — GLUCOSE, CAPILLARY

## 2011-05-11 NOTE — Progress Notes (Signed)
Patient ID: Daniel House, male   DOB: 31-Oct-1975, 36 y.o.   MRN: [id number]  Daily Progress Note Family Medicine Teaching Service Service Pager: [phone]  Subjective:  He has no complaints. Had a slight headache this AM that resolved with tylenol. Reports persistent SOB with exertion. Required 4 L via [place] overnight. On 3 L via [place] currently.   Objective Temp:  [98.1 F (36.7 C)-98.8 F (37.1 C)] 98.6 F (37 C) (04/21 0844) Pulse Rate:  [90-100] 96  (04/21 0844) Resp:  [17-18] 18  (04/21 0844) BP: (109-119)/(69-80) 109/70 mmHg (04/21 0844) SpO2:  [93 %-97 %] 95 % (04/21 1145) Weight:  [107 lb 3.2 oz (48.626 kg)] 107 lb 3.2 oz (48.626 kg) (04/20 2015)   Intake/Output Summary (Last 24 hours) at 05/11/11 1149 Last data filed at 05/11/11 0700  Gross per 24 hour  Intake   1665 ml  Output   2250 ml  Net   -585 ml   General: awake, alert, sitting up in bed. [place] in place at 3L CV: RRR, no murmurs Pulm: CTAB  Abd:  Soft, +BS, NTND Ext: mild nonpitting edema of feet bilaterally  Neuro: alert, oriented  Labs and Imaging:   Lab 05/11/11 0605 05/10/11 0605 05/08/11 0520  WBC 6.3 8.7 7.8  HGB 8.9* 9.2* 9.0*  HCT 26.3* 27.0* 26.3*  PLT 244 288 346    Lab 05/11/11 0605 05/10/11 0605 05/08/11 0520  NA 133* 131* 133*  K 4.3 4.5 4.1  CL 103 102 104  CO2 22 21 20   BUN 10 11 16   CREATININE 0.89 0.80 0.82  CALCIUM 8.0* 8.2* 8.2*  PROT -- -- --  BILITOT -- -- --  ALKPHOS -- -- --  ALT -- -- --  AST -- -- --  GLUCOSE 102* 81 129*   TSH 0.792 ACE 38 CD4 20 HIV Quant 837,756 LDH 715 Crytpo Neg RPR+, Titer 1:16   Assessment and Plan 36 year old Male with no significant PMHx who presents with 1 week of dyspnea, found to be tachycardic and hypoxic in ER, imagining concerning for interstitial lung disease:   1. Pulmonary - Imaging and clinical picture concerning for PCP pneumonia, or atypical infection. Transferred to SDU on 04/30/11 due to worsening respiratory status. Transferred  back to tele floor on 05/04/11. Telemetry monitoring d/c on 05/10/11. Overall patient is continue to improve - HIV positive. CD4 count 20. HIV viral load 837K. - ID consulted. We appreciate their excellent care of this patient, as well as arranging for patient to receive HIV meds. - RPR came back reactive, with titer of 1:16.  Confirmation test sent, with TP-PA >8.0.  There is an increased incidence of false + RPR in HIV patients, but given patient's history of syphilis, treated with Penicillin on Monday, and weekly for total of 3 weeks. - Patient has been on Bactrim IV 15 mg/kg of TMP divided q8 hours on started 04/29/11 after not improving on PO. This is day #13 of antibiotics. Transitioned back to PO Bactrim on 05/05/14. Tolerating well - Transitioned to Prednisone from Solumedrol (day #11). Steroid taper started on day #10 to 40mg  daily. - Antiretroviral therapy per ID. Patient has been approved for outpatient therapy and will be able to receive his medications at time of discharge. - Continue O2 via [place]. Will start humidified air, as well as saline spray as needed. Continue to wean as necessary. Continue IS - Will switch neb treatments to prn Albuterol only. Patient encouraged to ask for treatments as  needed - His point of contact is his sister Daniel House, whose phone number is listed in patient's room if needed. - Headache resolved - Patient has done really well with PT/OT and making improvement. Not a candidate for CIR -Has placement at a skilled nursing facility once medically  Stable.   2. Acute renal insufficiency- Noted on admission; may be due to dehydration +/- NSAID use, but again concerning for pulmonary-renal cause of illness or HIVAN. UA significant for spec grav at 1.040, 100 of protein, and red cells on admission. Resolved. - Creatinine within normal limits and stable.    - IVF @ 75 cc's/hr, patient has good urine output and normotensive.   -Will KVO IVF and repeat Cr in AM.   3.  Anemia- HgB improved. No signs of active bleeding. S/p 1 unit PRBC on 05/06/11. Hgb 9.2 post-transfusion - Discussed his HgB with patient . States he is not having frequent stools. Denies dark/bloody bowel movement.  - Continue Iron - Continue to monitor. Repeat CBC tomorrow. Can consider repeat transfusion if patient has significant fatigue or there is difficult in weaning O2.   4. Yeast infection- Patient with thrush and complaining of pain when he swallows. - Fluconazole PO started on 05/09/11 with 200mg  load and then continued on 100mg  daily - Magic mouthwash with lidocaine for symptomatic relief. (This has maalox, nystatin and benadryl in it which should help) - Perhaps he will have increased PO intake when this is treated  5. FEN/GI- patient appears malnourished, albumin low at 2.1. Will allow regular diet, give zofran prn, and protonix for GI prophylaxis. NS decrease to 75 cc/hr. Miralax as needed for constipation. - Continue Remeron to help with appetite.  - Encourage patient to eat whatever he wants. Continue to monitor I/O and daily weights. - Nutrition following  6. DVT ppx- SQ heparin   7. Disposition- Pending further clinical improvement. Continue to follow ID recommendations. Patient has SNF bed offer but patient not medically stable for discharge at this time. Continue to monitor.  CodeErlene Quan Pager: [phone] 05/11/2011, 12:10 PM

## 2011-05-12 ENCOUNTER — Inpatient Hospital Stay (HOSPITAL_COMMUNITY): Payer: Medicaid Other

## 2011-05-12 DIAGNOSIS — N179 Acute kidney failure, unspecified: Secondary | ICD-10-CM | POA: Diagnosis present

## 2011-05-12 DIAGNOSIS — B59 Pneumocystosis: Secondary | ICD-10-CM | POA: Diagnosis present

## 2011-05-12 DIAGNOSIS — A539 Syphilis, unspecified: Secondary | ICD-10-CM | POA: Diagnosis present

## 2011-05-12 DIAGNOSIS — B3781 Candidal esophagitis: Secondary | ICD-10-CM | POA: Diagnosis not present

## 2011-05-12 HISTORY — DX: Pneumocystosis: B59

## 2011-05-12 LAB — CBC
HCT: 27 % — ABNORMAL LOW (ref 39.0–52.0)
Hemoglobin: 9.3 g/dL — ABNORMAL LOW (ref 13.0–17.0)
MCH: 30.7 pg (ref 26.0–34.0)
RBC: 3.03 MIL/uL — ABNORMAL LOW (ref 4.22–5.81)

## 2011-05-12 LAB — BASIC METABOLIC PANEL
CO2: 24 mEq/L (ref 19–32)
Calcium: 8.2 mg/dL — ABNORMAL LOW (ref 8.4–10.5)
Glucose, Bld: 88 mg/dL (ref 70–99)
Sodium: 131 mEq/L — ABNORMAL LOW (ref 135–145)

## 2011-05-12 MED ORDER — PREDNISONE 20 MG PO TABS
20.0000 mg | ORAL_TABLET | Freq: Every day | ORAL | Status: DC
  Administered 2011-05-13 – 2011-05-15 (×3): 20 mg via ORAL
  Filled 2011-05-12 (×4): qty 1

## 2011-05-12 NOTE — Progress Notes (Signed)
Family Medicine Teaching Service Attending Note  On 4/21 I discussed patient [name]  with Dr. Armen Pickup and reviewed their note for today.  I agree with their assessment and plan.

## 2011-05-12 NOTE — Progress Notes (Signed)
Patient ID: Daniel House, male   DOB: 08-05-1975, 36 y.o.   MRN: [id number]  Daily Progress Note Family Medicine Teaching Service Jaryd Drew M. Navjot Pilgrim, MD Service Pager: [phone]  Subjective:  Patient states he is feeling much better this morning. States his appetite is improving and he is not having any difficulty swallowing. Having regular bowel movements. No complaints today.  Objective Temp:  [98.2 F (36.8 C)-98.6 F (37 C)] 98.2 F (36.8 C) (04/22 0416) Pulse Rate:  [87-104] 87  (04/22 0416) Resp:  [18] 18  (04/22 0416) BP: (108-113)/(70-79) 113/79 mmHg (04/22 0416) SpO2:  [95 %-98 %] 97 % (04/22 0416) Weight:  [104 lb 12.8 oz (47.537 kg)] 104 lb 12.8 oz (47.537 kg) (04/21 2040)   Intake/Output Summary (Last 24 hours) at 05/12/11 0802 Last data filed at 05/12/11 0650  Gross per 24 hour  Intake   1120 ml  Output   2225 ml  Net  -1105 ml   General: awake, alert, sitting up in chair. [place] in place at 3L. CV: RRR, no murmurs Pulm: CTAB  Abd:  Soft, +BS, NTND Ext: mild nonpitting edema of feet bilaterally  Neuro: alert, oriented  Labs and Imaging:   Lab 05/12/11 0645 05/11/11 0605 05/10/11 0605  WBC 7.2 6.3 8.7  HGB 9.3* 8.9* 9.2*  HCT 27.0* 26.3* 27.0*  PLT 222 244 288    Lab 05/11/11 0605 05/10/11 0605 05/08/11 0520  NA 133* 131* 133*  K 4.3 4.5 4.1  CL 103 102 104  CO2 22 21 20   BUN 10 11 16   CREATININE 0.89 0.80 0.82  CALCIUM 8.0* 8.2* 8.2*  PROT -- -- --  BILITOT -- -- --  ALKPHOS -- -- --  ALT -- -- --  AST -- -- --  GLUCOSE 102* 81 129*   TSH 0.792 ACE 38 CD4 20 HIV Quant 837,756 LDH 715 Crytpo Neg RPR+, Titer 1:16   Assessment and Plan 36 year old Male with no significant PMHx who presents with 1 week of dyspnea, found to be tachycardic and hypoxic in ER, imagining concerning for interstitial lung disease:   1. Pulmonary - Imaging and clinical picture concerning for PCP pneumonia, or atypical infection. Transferred to SDU on 04/30/11 due to  worsening respiratory status. Transferred back to tele floor on 05/04/11. Telemetry monitoring d/c on 05/10/11. Overall patient is continue to improve - HIV positive. CD4 count 20. HIV viral load 837K. - ID consulted. We appreciate their excellent care of this patient, as well as arranging for patient to receive HIV meds. - RPR came back reactive, with titer of 1:16.  Confirmation test sent, with TP-PA >8.0.  There is an increased incidence of false + RPR in HIV patients, but given patient's history of syphilis, treated with Penicillin weekly x1 dose, next dose due today. - Patient has been on Bactrim IV 15 mg/kg of TMP divided q8 hours on started 04/29/11 after not improving on PO. This is day #14 of antibiotics. Transitioned back to PO Bactrim on 05/05/14. Tolerating well - Transitioned to Prednisone from Solumedrol (day #13). Steroid taper started on day #10 to 40mg  daily. - Antiretroviral therapy per ID. Patient has been approved for outpatient therapy and will be able to receive his medications at time of discharge. - Continue O2 via [place]. Will start humidified air, as well as saline spray as needed. Continue to wean as necessary (currently doing well at 3L). Continue IS (patient at 1200 now) - Will switch neb treatments to prn Albuterol only.  Patient encouraged to ask for treatments as needed - [name], whose phone number is listed in patient's room if needed. - Patient has done really well with PT/OT and making improvement. Not a candidate for CIR. Has placement at a skilled nursing facility once medically stable - All nebs d/c'd today. MDI as needed. Repeat CXR ordered for comparison   2. Acute renal insufficiency- Noted on admission; may be due to dehydration +/- NSAID use, but again concerning for pulmonary-renal cause of illness or HIVAN. UA significant for spec grav at 1.040, 100 of protein, and red cells on admission. Resolved. - Creatinine within normal  limits and stable.    - IVF @ 20 cc's/hr, patient has good urine output and normotensive.   - IVF to saline lock  3. Anemia- HgB improved. No signs of active bleeding. S/p 1 unit PRBC on 05/06/11. Hgb 9.2 post-transfusion - Discussed his HgB with patient . States he is not having frequent stools. Denies dark/bloody bowel movement.  - Continue Iron - Continue to monitor. Repeat CBC tomorrow. Can consider repeat transfusion if patient has significant fatigue or there is difficult in weaning O2.   4. Yeast infection- Patient with thrush and complaining of pain when he swallows. - Fluconazole PO started on 05/09/11 with 200mg  load and then continued on 100mg  daily - Magic mouthwash with lidocaine for symptomatic relief. (This has maalox, nystatin and benadryl in it which should help)  5. FEN/GI- patient appears malnourished, albumin low at 2.1. Will allow regular diet, give zofran prn, and protonix for GI prophylaxis. NS decrease to 75 cc/hr. Miralax as needed for constipation. - Continue Remeron to help with appetite. Patient reports increased appetite but continues to have weight loss. Can consider adding Megace. - Encourage patient to eat whatever he wants. Continue to monitor I/O and daily weights. - Nutrition following  6. DVT ppx- SQ heparin   7. Disposition- Pending further clinical improvement. Continue to follow ID recommendations. Will d/c to SNF when medically stable  Code: Full   Brayam Boeke Pager: [phone] 05/12/2011, 8:02 AM
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
## 2011-05-12 NOTE — Progress Notes (Signed)
Physical Therapy Treatment Patient Details Name: Daniel House MRN: [id number] DOB: [birth year] Today's Date: 05/12/2011 Time: 9562-1308 PT Time Calculation (min): 24 min  PT Assessment / Plan / Recommendation Comments on Treatment Session  Continuing improvements with activity tolerance and oxygen consumption;   Initiated ambulation on 4 liters via Nasal Cannula, O2 sats dropped to 83% (lowest observed), increased O2 to 6 liters and O2 sats increased to between 87-88%; Stayed on 6 liters once seated, increased to 90 to 92% with approx 5 minutes of seated rest; ended session on 3 liters; RN aware   Considering pt's excellent progress, will discuss pt's dc plan with OT . . . . Is he progressing well enough to dc home with [facility], HHPT/OT follow-up?   Follow Up Recommendations  Skilled nursing facility    Equipment Recommendations  Defer to next venue    Frequency Min 3X/week   Plan Discharge plan needs to be updated    Precautions / Restrictions Precautions Precautions: Other (comment) (Monitor O2 sats with activity) Restrictions Other Position/Activity Restrictions: Desats with activity   Pertinent Vitals/Pain     Mobility  Bed Mobility Bed Mobility: Supine to Sit Supine to Sit: 6: Modified independent (Device/Increase time) Sitting - Scoot to Edge of Bed: 7: Independent Details for Bed Mobility Assistance: Was able to manage his lines for IV and O2 Transfers Transfers: Sit to Stand;Stand to Sit Sit to Stand: 4: Min guard Stand to Sit: 4: Min guard Details for Transfer Assistance: vc's for safe technique and hand placement Ambulation/Gait Ambulation/Gait Assistance: 4: Min guard Ambulation Distance (Feet): 80 Feet Assistive device: Rolling walker Ambulation/Gait Assistance Details: cues for upright posture, deep breathing; Started amb on 4 liters, had to incr to 6 liters as pt's O2 sats decr to 83% Gait Pattern: Step-through pattern (Approaching WNL)         PT  Goals Acute Rehab PT Goals Time For Goal Achievement: 05/19/11 Potential to Achieve Goals: Good Pt will go Supine/Side to Sit: with modified independence PT Goal: Supine/Side to Sit - Progress: Met Pt will go Sit to Stand: with modified independence PT Goal: Sit to Stand - Progress: Progressing toward goal Pt will go Stand to Sit: with modified independence PT Goal: Stand to Sit - Progress: Progressing toward goal Pt will Ambulate: 51 - 150 feet;with supervision;with rolling walker PT Goal: Ambulate - Progress: Progressing toward goal  Visit Information  Last PT Received On: 05/12/11 Assistance Needed: +1    Subjective Data  Subjective: Agreeable to getting up Patient Stated Goal: Get better and get healthier   Cognition  Overall Cognitive Status: Appears within functional limits for tasks assessed/performed Arousal/Alertness: Awake/alert Orientation Level: Oriented X4 / Intact Behavior During Session: [facility] for tasks performed    Balance     End of Session PT - End of Session Equipment Utilized During Treatment: Oxygen Activity Tolerance: Patient tolerated treatment well Patient left: in chair;with call bell/phone within reach Nurse Communication: Mobility status    Van Clines [facility] [place], [place] [phone]  05/12/2011, 1:19 PM

## 2011-05-12 NOTE — Progress Notes (Signed)
Subjective: Pt feels much better today  Antibiotics:  Anti-infectives     Start     Dose/Rate Route Frequency Ordered Stop   05/11/11 1000   fluconazole (DIFLUCAN) tablet 100 mg        100 mg Oral Daily 05/10/11 1414     05/09/11 1800   azithromycin (ZITHROMAX) tablet 1,200 mg        1,200 mg Oral Weekly 05/09/11 1535     05/09/11 1200   fluconazole (DIFLUCAN) tablet 200 mg  Status:  Discontinued        200 mg Oral Daily 05/09/11 0921 05/10/11 1414   05/07/11 0800   ritonavir (NORVIR) tablet 100 mg        100 mg Oral Daily with breakfast 05/06/11 1010     05/05/11 1400   penicillin g benzathine (BICILLIN LA) 1200000 UNIT/2ML injection 2.4 Million Units        2.4 Million Units Intramuscular Weekly 05/05/11 1224 05/26/11 1359   05/05/11 1400  sulfamethoxazole-trimethoprim (BACTRIM DS) 800-160 MG per tablet 2 tablet       2 tablet Oral 3 times per day 05/05/11 1225     05/03/11 1100   Darunavir Ethanolate (PREZISTA) tablet 800 mg        800 mg Oral Daily with breakfast 05/03/11 0801     05/02/11 1000   Darunavir Ethanolate (PREZISTA) tablet 800 mg  Status:  Discontinued        800 mg Oral Daily 05/02/11 0928 05/03/11 0801   05/02/11 1000   ritonavir (NORVIR) tablet 100 mg  Status:  Discontinued        100 mg Oral Daily 05/02/11 0928 05/06/11 1010   05/02/11 1000   emtricitabine-tenofovir (TRUVADA) 200-300 MG per tablet 1 tablet        1 tablet Oral Daily 05/02/11 0928     05/01/11 2000   sulfamethoxazole-trimethoprim (BACTRIM) 255 mg in dextrose 5 % 250 mL IVPB  Status:  Discontinued        255 mg 265.9 mL/hr over 60 Minutes Intravenous Every 8 hours 05/01/11 1908 05/05/11 1225   04/30/11 1600   azithromycin (ZITHROMAX) 500 mg in dextrose 5 % 250 mL IVPB  Status:  Discontinued        500 mg 250 mL/hr over 60 Minutes Intravenous Every 24 hours 04/30/11 1455 05/02/11 1044   04/30/11 1600   ceFEPIme (MAXIPIME) 1 g in dextrose 5 % 50 mL IVPB  Status:  Discontinued        1  g 100 mL/hr over 30 Minutes Intravenous Every 12 hours 04/30/11 1455 05/02/11 1044   04/29/11 1100   sulfamethoxazole-trimethoprim (BACTRIM) 255 mg in dextrose 5 % 250 mL IVPB  Status:  Discontinued        255 mg 265.9 mL/hr over 60 Minutes Intravenous Every 8 hours 04/29/11 1008 05/01/11 1909   04/28/11 1600   sulfamethoxazole-trimethoprim (BACTRIM DS) 800-160 MG per tablet 2 tablet  Status:  Discontinued        2 tablet Oral 3 times daily 04/28/11 1348 04/29/11 1008   04/28/11 1330   sulfamethoxazole-trimethoprim (BACTRIM,SEPTRA) 400-80 MG per tablet 2.5 tablet  Status:  Discontinued        2.5 tablet Oral 4 times per day 04/28/11 1224 04/28/11 1348   04/28/11 1100   sulfamethoxazole-trimethoprim (BACTRIM DS) 800-160 MG per tablet 1 tablet  Status:  Discontinued        1 tablet Oral Daily 04/28/11 1046 04/28/11 1224   04/27/11 2345  cefTRIAXone (ROCEPHIN) 1 g in dextrose 5 % 50 mL IVPB        1 g 100 mL/hr over 30 Minutes Intravenous  Once 04/27/11 2338 04/28/11 0028   04/27/11 2345   azithromycin (ZITHROMAX) 500 mg in dextrose 5 % 250 mL IVPB        500 mg 250 mL/hr over 60 Minutes Intravenous  Once 04/27/11 2338 04/28/11 0128          Medications: Scheduled Meds:    . antiseptic oral rinse  15 mL Mouth Rinse BID  . azithromycin  1,200 mg Oral Weekly  . darunavir  800 mg Oral Q breakfast  . docusate sodium  100 mg Oral BID  . emtricitabine-tenofovir  1 tablet Oral Daily  . ferrous sulfate  325 mg Oral TID WC  . fluconazole  100 mg Oral Daily  . heparin  5,000 Units Subcutaneous Q8H  . mirtazapine  15 mg Oral QHS  . penicillin g benzathine (BICILLIN-LA) IM  2.4 Million Units Intramuscular Weekly  . predniSONE  40 mg Oral Q breakfast  . ritonavir  100 mg Oral Q breakfast  . sodium chloride  3 mL Intravenous Q12H  . sulfamethoxazole-trimethoprim  2 tablet Oral Q8H   Continuous Infusions:    . DISCONTD: sodium chloride 1,000 mL (05/11/11 1733)   PRN  Meds:.acetaminophen, albuterol, alum & mag hydroxide-simeth, benzonatate, camphor-menthol, diphenhydrAMINE, magic mouthwash w/lidocaine, ondansetron, oxyCODONE, polyethylene glycol, zolpidem, DISCONTD: albuterol   Objective: Weight change: -2 lb 6.4 oz (-1.089 kg)  Intake/Output Summary (Last 24 hours) at 05/12/11 1417 Last data filed at 05/12/11 1222  Gross per 24 hour  Intake   1120 ml  Output   2226 ml  Net  -1106 ml   Blood pressure 115/77, pulse 102, temperature 98.7 F (37.1 C), temperature source Oral, resp. rate 18, height 6' (1.829 m), weight 104 lb 12.8 oz (47.537 kg), SpO2 97.00%. Temp:  [98.2 F (36.8 C)-98.7 F (37.1 C)] 98.7 F (37.1 C) (04/22 0942) Pulse Rate:  [87-102] 102  (04/22 0942) Resp:  [18] 18  (04/22 0942) BP: (108-115)/(77-79) 115/77 mmHg (04/22 0942) SpO2:  [97 %-98 %] 97 % (04/22 0942) Weight:  [104 lb 12.8 oz (47.537 kg)] 104 lb 12.8 oz (47.537 kg) (04/21 2040)  Physical Exam: General: Alert and awake, oriented x3, a HEENT: anicteric sclera, much less thrush CVS, normal r,  no murmur rubs or gallops Chest: more clear Abdomen: soft nontender, nondistended, normal bowel sounds, Extremities: no  clubbing or edema noted bilaterally Skin: no rashes  Neuro: nonfocal  Lab Results:  Basename 05/12/11 0645 05/11/11 0605  WBC 7.2 6.3  HGB 9.3* 8.9*  HCT 27.0* 26.3*  PLT 222 244    BMET  Basename 05/12/11 0645 05/11/11 0605  NA 131* 133*  K 4.4 4.3  CL 99 103  CO2 24 22  GLUCOSE 88 102*  BUN 14 10  CREATININE 0.89 0.89  CALCIUM 8.2* 8.0*    Micro Results: Recent Results (from the past 240 hour(s))  CULTURE, SPUTUM-ASSESSMENT     Status: Normal   Collection Time   05/05/11 12:05 PM      Component Value Range Status Comment   Specimen Description SPUTUM   Final    Special Requests NONE   Final    Sputum evaluation     Final    Value: THIS SPECIMEN IS ACCEPTABLE. RESPIRATORY CULTURE REPORT TO FOLLOW.   Report Status 05/05/2011 FINAL    Final   CULTURE, RESPIRATORY  Status: Normal   Collection Time   05/05/11 12:05 PM      Component Value Range Status Comment   Specimen Description SPUTUM   Final    Special Requests NONE   Final    Gram Stain     Final    Value: RARE WBC PRESENT,BOTH PMN AND MONONUCLEAR     NO SQUAMOUS EPITHELIAL CELLS SEEN     NO ORGANISMS SEEN   Culture NORMAL OROPHARYNGEAL FLORA   Final    Report Status 05/07/2011 FINAL   Final     Studies/Results: Dg Chest 2 View  05/12/2011  *RADIOLOGY REPORT*  Clinical Data: Follow up pneumonia and shortness of breath  CHEST - 2 VIEW  Comparison: 05/02/2011  Findings: Heart size appears normal.  No pleural effusion identified.  The lungs appear hyperinflated.  Bilateral hazy lung opacities are again noted.  When compared with the previous exam there is improved aeration of both lower lobes.  No new findings identified.  IMPRESSION:  1.  Persistent but improved bilateral lung opacities.  Original Report Authenticated By: Rosealee Albee, M.D.      Assessment/Plan: Karmelo Bass is a 36 y.o. male with  Newly diagnosed HIV/AIDS, and PCP pneumonia:   1) PCP Pneumonia: He was slow to improve from resp standpoint. Kennon Portela has premature COPD underlying this PCP pneumonia --changed to bactrim to high dose PO and  Prednisone --will need 21 day course of anti PCP therapy with steroids (day #1= 04/29/11) -drop to 20mg  of prednisone today  2) Thrush: --agree with fluconazole and would be reasonable to keep on this while he is on steroids and high dose bactrim --could drop dose to 100mg  after 200mg  load  2) Syphilis; has had years ago. Possibly reinfected. Neurosyphilis could also be concern> Will give him IM PCN today and weekly x 3 doses and follow titers   3) HIV/AIDS:   --continue prezista/norvir and truvada -- per [employer]  Pt  Has ADAP approval --azithromycin weekly for MAI prophylaxis    LOS: 15 days   Acey Lav 05/12/2011, 2:17  PM

## 2011-05-12 NOTE — Progress Notes (Signed)
Seen and examined.  Good spirits.  Feels fine.  Unfortunately, still drops O2 sats significantly with activity.  We need to have him maintain his O2 sat with activity before we feel safe with DC.

## 2011-05-13 DIAGNOSIS — B59 Pneumocystosis: Secondary | ICD-10-CM

## 2011-05-13 DIAGNOSIS — B1920 Unspecified viral hepatitis C without hepatic coma: Secondary | ICD-10-CM | POA: Diagnosis present

## 2011-05-13 LAB — PREPARE RBC (CROSSMATCH)

## 2011-05-13 MED ORDER — MEGESTROL ACETATE 40 MG PO TABS
40.0000 mg | ORAL_TABLET | Freq: Every day | ORAL | Status: DC
  Administered 2011-05-13: 40 mg via ORAL
  Filled 2011-05-13: qty 1

## 2011-05-13 MED ORDER — MEGESTROL ACETATE 40 MG/ML PO SUSP
400.0000 mg | Freq: Every day | ORAL | Status: DC
  Administered 2011-05-14 – 2011-05-15 (×2): 400 mg via ORAL
  Filled 2011-05-13 (×3): qty 10

## 2011-05-13 MED ORDER — ALBUTEROL SULFATE HFA 108 (90 BASE) MCG/ACT IN AERS: 2.0000 | INHALATION_SPRAY | RESPIRATORY_TRACT | Status: DC | PRN

## 2011-05-13 MED ORDER — ALBUTEROL SULFATE HFA 108 (90 BASE) MCG/ACT IN AERS
2.0000 | INHALATION_SPRAY | RESPIRATORY_TRACT | Status: DC
  Administered 2011-05-13: 2 via RESPIRATORY_TRACT
  Filled 2011-05-13: qty 6.7

## 2011-05-13 NOTE — Progress Notes (Signed)
Clinical Child psychotherapist (CSW) refaxed pt out to facilities [place] the [place]. Pt now has a pending Medicaid number [id number] T. CSW still also awaiting to hear back from [facility] as at this moment they are not sure whether they can accept pt. CSW to continue following.  Theresia Bough, MSW, Theresia Majors [phone]

## 2011-05-13 NOTE — Progress Notes (Signed)
Patient ID: Daniel House, male   DOB: 1975-03-20, 36 y.o.   MRN: [id number]  Daily Progress Note [name]. [name], M.D., M.B.A  Family Medicine PGY-1 Pager [phone]   Subjective:   PT Assesment 4/22 - Desaturated to 83% on 4L with ambulation; required 6L O2 to maintain O2 saturation at 88%   Patient in chair this morning and states that he is comfortable now, but easily fatigued.   Hepatitis - I discussed the results of his hepatitis screening with the patient this morning. He denied knowing what hepatitis was or having spoken to a physician about it before. I explained that this was a virus that could affect his liver and will need to be monitored in the future. After looking through our records and talking to the lab, no confirmatory testing was ordered by [place].    Objective Temp:  [97.8 F (36.6 C)-98.9 F (37.2 C)] 98.2 F (36.8 C) (04/23 0502) Pulse Rate:  [81-105] 81  (04/23 0502) Resp:  [18-20] 18  (04/23 0502) BP: (112-122)/(77-86) 122/86 mmHg (04/23 0502) SpO2:  [96 %-100 %] 100 % (04/23 0502)   Intake/Output Summary (Last 24 hours) at 05/13/11 0811 Last data filed at 05/13/11 0500  Gross per 24 hour  Intake    600 ml  Output   1951 ml  Net  -1351 ml   General: awake, alert, sitting up in chair. [place] in place at 3L. CV: RRR, no murmurs Pulm: CTAB  Abd:  Soft, +BS, NTND Ext: mild nonpitting edema of feet bilaterally  Neuro: alert, oriented  Labs and Imaging:   Lab 05/12/11 0645 05/11/11 0605 05/10/11 0605  WBC 7.2 6.3 8.7  HGB 9.3* 8.9* 9.2*  HCT 27.0* 26.3* 27.0*  PLT 222 244 288    Lab 05/12/11 0645 05/11/11 0605 05/10/11 0605  NA 131* 133* 131*  K 4.4 4.3 4.5  CL 99 103 102  CO2 24 22 21   BUN 14 10 11   CREATININE 0.89 0.89 0.80  CALCIUM 8.2* 8.0* 8.2*  PROT -- -- --  BILITOT -- -- --  ALKPHOS -- -- --  ALT -- -- --  AST -- -- --  GLUCOSE 88 102* 81   TSH 0.792 ACE 38 CD4 20 HIV Quant 837,756 LDH 715 Crytpo Neg RPR+, Titer 1:16    Assessment and Plan 35 year old Male with no significant PMHx who presents with 1 week of dyspnea, found to be tachycardic and hypoxic in ER, imagining concerning for interstitial lung disease:   1. Infectious Disease:  A. PCP Pneumonia - Imaging and clinical picture concerning for PCP pneumonia, or atypical infection. Transferred to SDU on 04/30/11 due to worsening respiratory status. Transferred back to tele floor on 05/04/11. Telemetry monitoring d/c on 05/10/11. Overall patient is continue to improve  - Transitioned to Prednisone from Solumedrol (day #13). Steroid taper started on day #10 to 40mg  daily. - Patient has been on Bactrim IV 15 mg/kg of TMP divided q8 hours on started 04/29/11 after not improving on PO. This is day #15 of antibiotics. Transitioned back to PO Bactrim on 05/05/14. Tolerating wel - Continue O2 via [place]. Will start humidified air, as well as saline spray as needed. Continue to wean as necessary (currently doing well at 3L). Continue IS (patient at 1200 now) - given desats with therapy, albuterol scheduled q 4 now to see if it improves hypoxemia  B. HIV positive. CD4 count 20. HIV viral load 837K. - ID consulted. We appreciate their excellent care of  this patient, as well as arranging for patient to receive HIV meds. - Antiretroviral therapy per ID. Patient has been approved for outpatient therapy and will be able to receive his medications at time of discharge.  C. Syphillis: - RPR came back reactive, with titer of 1:16.  Confirmation test sent, with TP-PA >8.0.  There is an increased incidence of false + RPR in HIV patients, but given patient's history of syphilis, treated with Penicillin weekly x1 dose, s/p 2 doses.  D. Hep C - positive HCV Ab, no reflex test ordered - will order Hep C PCR today     2. Acute renal insufficiency- Noted on admission; may be due to dehydration +/- NSAID use, but again concerning for pulmonary-renal cause of illness or HIVAN. UA  significant for spec grav at 1.040, 100 of protein, and red cells on admission. Resolved. - Creatinine within normal limits and stable.      - IVF to saline lock  3. Anemia- HgB improved. No signs of active bleeding. S/p 1 unit PRBC on 05/06/11. Hgb 9.2 post-transfusion - Discussed his HgB with patient . States he is not having frequent stools. Denies dark/bloody bowel movement.  - Continue Iron  - I'm going to order 1 units of blood to see if that improves hypoxemia; follow-up CBC in AM  4. Yeast infection- Patient with thrush and complaining of pain when he swallows. - Fluconazole PO started on 05/09/11 with 200mg  load and then continued on 100mg  daily - Magic mouthwash with lidocaine for symptomatic relief. (This has maalox, nystatin and benadryl in it which should help) - improved, but continue fluconazole while on antibiotics   5. FEN/GI- patient appears malnourished, albumin low at 2.1. Will allow regular diet, give zofran prn, and protonix for GI prophylaxis. NS decrease to 75 cc/hr. Miralax as needed for constipation. - Continue Remeron to help with appetite. Patient reports increased appetite but continues to have weight loss. Can consider adding Megace. - Encourage patient to eat whatever he wants. Continue to monitor I/O and daily weights. 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 DVT ppx- SQ heparin   7. Disposition- Pending further clinical improvement. Continue to follow ID recommendations. Will d/c to SNF when medically stable  Code: Full   Mat Carne Pager: [phone] 05/13/2011, 8:11 AM

## 2011-05-13 NOTE — Progress Notes (Signed)
Seen and examined.  Unfortunately continues to desaturate with minimal exertion.  Will cont current treatment.

## 2011-05-13 NOTE — Progress Notes (Signed)
Occupational Therapy Treatment Patient Details Name: Daniel House MRN: [id number] DOB: 09-Sep-1975 Today's Date: 05/13/2011 Time: 4098-1191 OT Time Calculation (min): 30 min  OT Assessment / Plan / Recommendation Comments on Treatment Session Pt did well with completing bathing task with supervision and only 2 rest breaks for all of task; however his O2 sats on 3L still drop to lowest of 81% and increased to 96% with total rest and PLB. When pt sat EOB sats even dropped to 86-89% on 3L then up to 90% with some deep breaths.     Follow Up Recommendations  Inpatient Rehab;Skilled nursing facility    Equipment Recommendations  Defer to next venue    Frequency Min 2X/week   Plan Discharge plan remains appropriate    Precautions / Restrictions Precautions Precaution Comments: monitor O2 sats with activity as he desats easily Restrictions Weight Bearing Restrictions: No   Pertinent Vitals/Pain No complaint of pain    ADL  Upper Body Bathing: Performed;Chest;Right arm;Left arm;Abdomen;Supervision/safety;Set up Where Assessed - Upper Body Bathing: Sitting, chair;Unsupported;Supported Lower Body Bathing: Performed;Supervision/safety;Set up Where Assessed - Lower Body Bathing: Sit to stand from chair ADL Comments: Pt needed some reinforcement and cueing of when to stop and rest with reports of feeling short of breath today. Pt did well with PLB once initiated but needed those cues to stop and rest.     OT Goals ADL Goals ADL Goal: Upper Body Bathing - Progress: Met ADL Goal: Lower Body Bathing - Progress: Met Miscellaneous OT Goals OT Goal: Miscellaneous Goal #1 - Progress: Progressing toward goals Miscellaneous OT Goal #2: goal added 05/13/11 pt will gather all items to perform his bath/dress with supervision and assistive device PRN. OT Goal: Miscellaneous Goal #2 - Progress: Goal set today Miscellaneous OT Goal #3:goal added 05/13/11  Pt will perform all aspects of dressing with  supervision and only 1 rest break.  OT Goal: Miscellaneous Goal #3 - Progress: Goal set today  Visit Information  Last OT Received On: 05/13/11 Assistance Needed: +1    Subjective Data  Subjective: pt smiles when OT greets pt Patient Stated Goal: to go to rehab and get stronger   Prior Functioning       Cognition  Overall Cognitive Status: Appears within functional limits for tasks assessed/performed Arousal/Alertness: Awake/alert Orientation Level: Appears intact for tasks assessed Behavior During Session: [facility] for tasks performed    Mobility Bed Mobility Bed Mobility: Supine to Sit Supine to Sit: 6: Modified independent (Device/Increase time);HOB elevated Sitting - Scoot to Edge of Bed: 7: Independent Transfers Transfers: Sit to Stand;Stand to Sit Transfers: Yes Sit to Stand: 5: Supervision;From bed;From chair/3-in-1 Stand to Sit: 5: Supervision;To chair/3-in-1   Exercises    Balance    End of Session OT - End of Session Activity Tolerance: Patient limited by fatigue Patient left: in chair;with call bell/phone within reach Nurse Communication: Mobility status   Lennox Laity [phone] 05/13/2011, 11:21 AM

## 2011-05-13 NOTE — Progress Notes (Signed)
Subjective: Pt feels much better today  Antibiotics:  Anti-infectives     Start     Dose/Rate Route Frequency Ordered Stop   05/11/11 1000   fluconazole (DIFLUCAN) tablet 100 mg        100 mg Oral Daily 05/10/11 1414     05/09/11 1800   azithromycin (ZITHROMAX) tablet 1,200 mg        1,200 mg Oral Weekly 05/09/11 1535     05/09/11 1200   fluconazole (DIFLUCAN) tablet 200 mg  Status:  Discontinued        200 mg Oral Daily 05/09/11 0921 05/10/11 1414   05/07/11 0800   ritonavir (NORVIR) tablet 100 mg        100 mg Oral Daily with breakfast 05/06/11 1010     05/05/11 1400   penicillin g benzathine (BICILLIN LA) 1200000 UNIT/2ML injection 2.4 Million Units        2.4 Million Units Intramuscular Weekly 05/05/11 1224 05/26/11 1359   05/05/11 1400  sulfamethoxazole-trimethoprim (BACTRIM DS) 800-160 MG per tablet 2 tablet       2 tablet Oral 3 times per day 05/05/11 1225     05/03/11 1100   Darunavir Ethanolate (PREZISTA) tablet 800 mg        800 mg Oral Daily with breakfast 05/03/11 0801     05/02/11 1000   Darunavir Ethanolate (PREZISTA) tablet 800 mg  Status:  Discontinued        800 mg Oral Daily 05/02/11 0928 05/03/11 0801   05/02/11 1000   ritonavir (NORVIR) tablet 100 mg  Status:  Discontinued        100 mg Oral Daily 05/02/11 0928 05/06/11 1010   05/02/11 1000   emtricitabine-tenofovir (TRUVADA) 200-300 MG per tablet 1 tablet        1 tablet Oral Daily 05/02/11 0928     05/01/11 2000   sulfamethoxazole-trimethoprim (BACTRIM) 255 mg in dextrose 5 % 250 mL IVPB  Status:  Discontinued        255 mg 265.9 mL/hr over 60 Minutes Intravenous Every 8 hours 05/01/11 1908 05/05/11 1225   04/30/11 1600   azithromycin (ZITHROMAX) 500 mg in dextrose 5 % 250 mL IVPB  Status:  Discontinued        500 mg 250 mL/hr over 60 Minutes Intravenous Every 24 hours 04/30/11 1455 05/02/11 1044   04/30/11 1600   ceFEPIme (MAXIPIME) 1 g in dextrose 5 % 50 mL IVPB  Status:  Discontinued        1  g 100 mL/hr over 30 Minutes Intravenous Every 12 hours 04/30/11 1455 05/02/11 1044   04/29/11 1100   sulfamethoxazole-trimethoprim (BACTRIM) 255 mg in dextrose 5 % 250 mL IVPB  Status:  Discontinued        255 mg 265.9 mL/hr over 60 Minutes Intravenous Every 8 hours 04/29/11 1008 05/01/11 1909   04/28/11 1600   sulfamethoxazole-trimethoprim (BACTRIM DS) 800-160 MG per tablet 2 tablet  Status:  Discontinued        2 tablet Oral 3 times daily 04/28/11 1348 04/29/11 1008   04/28/11 1330   sulfamethoxazole-trimethoprim (BACTRIM,SEPTRA) 400-80 MG per tablet 2.5 tablet  Status:  Discontinued        2.5 tablet Oral 4 times per day 04/28/11 1224 04/28/11 1348   04/28/11 1100   sulfamethoxazole-trimethoprim (BACTRIM DS) 800-160 MG per tablet 1 tablet  Status:  Discontinued        1 tablet Oral Daily 04/28/11 1046 04/28/11 1224   04/27/11 2345  cefTRIAXone (ROCEPHIN) 1 g in dextrose 5 % 50 mL IVPB        1 g 100 mL/hr over 30 Minutes Intravenous  Once 04/27/11 2338 04/28/11 0028   04/27/11 2345   azithromycin (ZITHROMAX) 500 mg in dextrose 5 % 250 mL IVPB        500 mg 250 mL/hr over 60 Minutes Intravenous  Once 04/27/11 2338 04/28/11 0128          Medications: Scheduled Meds:    . albuterol  2 puff Inhalation Q4H  . antiseptic oral rinse  15 mL Mouth Rinse BID  . azithromycin  1,200 mg Oral Weekly  . darunavir  800 mg Oral Q breakfast  . docusate sodium  100 mg Oral BID  . emtricitabine-tenofovir  1 tablet Oral Daily  . ferrous sulfate  325 mg Oral TID WC  . fluconazole  100 mg Oral Daily  . megestrol  400 mg Oral Daily  . mirtazapine  15 mg Oral QHS  . penicillin g benzathine (BICILLIN-LA) IM  2.4 Million Units Intramuscular Weekly  . predniSONE  20 mg Oral Q breakfast  . ritonavir  100 mg Oral Q breakfast  . sodium chloride  3 mL Intravenous Q12H  . sulfamethoxazole-trimethoprim  2 tablet Oral Q8H  . DISCONTD: heparin  5,000 Units Subcutaneous Q8H  . DISCONTD: megestrol  40  mg Oral Daily   Continuous Infusions:   PRN Meds:.acetaminophen, alum & mag hydroxide-simeth, benzonatate, camphor-menthol, diphenhydrAMINE, magic mouthwash w/lidocaine, ondansetron, oxyCODONE, polyethylene glycol, zolpidem, DISCONTD: albuterol   Objective: Weight change:   Intake/Output Summary (Last 24 hours) at 05/13/11 1704 Last data filed at 05/13/11 1300  Gross per 24 hour  Intake    600 ml  Output   2050 ml  Net  -1450 ml   Blood pressure 115/72, pulse 96, temperature 98.1 F (36.7 C), temperature source Oral, resp. rate 18, height 6' (1.829 m), weight 104 lb 12.8 oz (47.537 kg), SpO2 100.00%. Temp:  [98.1 F (36.7 C)-98.9 F (37.2 C)] 98.1 F (36.7 C) (04/23 1400) Pulse Rate:  [81-110] 96  (04/23 1400) Resp:  [18-20] 18  (04/23 1400) BP: (113-122)/(72-86) 115/72 mmHg (04/23 1400) SpO2:  [81 %-100 %] 100 % (04/23 1400)  Physical Exam: General: Alert and awake, oriented x3, a HEENT: anicteric sclera, much less thrush CVS, normal r,  no murmur rubs or gallops Chest: more clear Abdomen: soft nontender, nondistended, normal bowel sounds, Extremities: no  clubbing or edema noted bilaterally Skin: no rashes  Neuro: nonfocal  Lab Results:  Basename 05/12/11 0645 05/11/11 0605  WBC 7.2 6.3  HGB 9.3* 8.9*  HCT 27.0* 26.3*  PLT 222 244    BMET  Basename 05/12/11 0645 05/11/11 0605  NA 131* 133*  K 4.4 4.3  CL 99 103  CO2 24 22  GLUCOSE 88 102*  BUN 14 10  CREATININE 0.89 0.89  CALCIUM 8.2* 8.0*    Micro Results: Recent Results (from the past 240 hour(s))  CULTURE, SPUTUM-ASSESSMENT     Status: Normal   Collection Time   05/05/11 12:05 PM      Component Value Range Status Comment   Specimen Description SPUTUM   Final    Special Requests NONE   Final    Sputum evaluation     Final    Value: THIS SPECIMEN IS ACCEPTABLE. RESPIRATORY CULTURE REPORT TO FOLLOW.   Report Status 05/05/2011 FINAL   Final   CULTURE, RESPIRATORY     Status: Normal  Collection  Time   05/05/11 12:05 PM      Component Value Range Status Comment   Specimen Description SPUTUM   Final    Special Requests NONE   Final    Gram Stain     Final    Value: RARE WBC PRESENT,BOTH PMN AND MONONUCLEAR     NO SQUAMOUS EPITHELIAL CELLS SEEN     NO ORGANISMS SEEN   Culture NORMAL OROPHARYNGEAL FLORA   Final    Report Status 05/07/2011 FINAL   Final     Studies/Results: Dg Chest 2 View  05/12/2011  *RADIOLOGY REPORT*  Clinical Data: Follow up pneumonia and shortness of breath  CHEST - 2 VIEW  Comparison: 05/02/2011  Findings: Heart size appears normal.  No pleural effusion identified.  The lungs appear hyperinflated.  Bilateral hazy lung opacities are again noted.  When compared with the previous exam there is improved aeration of both lower lobes.  No new findings identified.  IMPRESSION:  1.  Persistent but improved bilateral lung opacities.  Original Report Authenticated By: Rosealee Albee, M.D.      Assessment/Plan: Daniel House is a 36 y.o. male with  Newly diagnosed HIV/AIDS, and PCP pneumonia:   1) PCP Pneumonia: He was slow to improve from resp standpoint. Daniel House has premature COPD underlying this PCP pneumonia --changed to bactrim to high dose PO and  Prednisone --will need 21 day course of anti PCP therapy with steroids (day #1= 04/29/11)   2) Thrush: --agree with fluconazole and would be reasonable to keep on this while he is on steroids and high dose bactrim   2) Syphilis; has had years ago. Possibly reinfected. Neurosyphilis could also be concern Needs total of three weekly doses and follow titers   3) HIV/AIDS:   --continue prezista/norvir and truvada -- per Daniel House  Pt  Has ADAP approval --azithromycin weekly for MAI prophylaxis --bactrim DS daily once done with his rx for PCP  4) Disp: Will need DC to SNF vs home with home O2  He should be seen in followup in [facility]  One week after DC  I will ask my clinic manager to make sure we  have date and time for him to fu in [facility]  I will otherwise sign off Please call with further questions    LOS: 16 days   Daniel House 05/13/2011, 5:04 PM

## 2011-05-13 NOTE — Progress Notes (Signed)
Physical Therapy Treatment Patient Details Name: Daniel House MRN: [id number] DOB: [birth year] Today's Date: 05/13/2011 Time: 4098-1191 PT Time Calculation (min): 29 min  PT Assessment / Plan / Recommendation Comments on Treatment Session  Session initiated on 3 L O2, sats 99-100%;  Maintained O2 sats >90% for most of walk, however desatted to 85% lowest observed, increased O2 to 6 liters, then 4 liters;  Noted HR max 157 (pt asymptomatic for dizziness) during amb;  Ended amb in chair, O2 4 liters, sats 90%, HR 124; able to decr O2 to 3 liters post approx 3 minutes of seated rest  I still think it is worth considering dc to sister's home as opposed to SNF -- his biggest challenge will be the flight of steps to reach the bedroom; Still, it seems like he could get home, go up the steps with assist once, and then spend most of time upstairs (there is a bathroom upstairs) -- then HHPT can progress him to safe (and daily) stair negotiation with independence; will discuss with OT    Follow Up Recommendations  Home health PT;Supervision - Intermittent    Equipment Recommendations  Other (comment) (If for home, to be determined)    Frequency Min 3X/week   Plan Discharge plan needs to be updated    Precautions / Restrictions Precautions Precaution Comments: monitor O2 sats with activity as he desats easily Restrictions Other Position/Activity Restrictions: Desats with activity   Pertinent Vitals/Pain (see comments on session)    Mobility  Bed Mobility Bed Mobility: Supine to Sit Supine to Sit: 6: Modified independent (Device/Increase time);HOB elevated Details for Bed Mobility Assistance: Was able to manage his lines for IV and O2 Transfers Transfers: Sit to Stand;Stand to Sit Sit to Stand: 5: Supervision;From bed;From chair/3-in-1 Stand to Sit: 5: Supervision;To chair/3-in-1 Details for Transfer Assistance: vc's for safe technique and hand placement; and to  self-monitor Ambulation/Gait Ambulation/Gait Assistance: 5: Supervision Ambulation Distance (Feet): 150 Feet Assistive device: Other (Comment) (Pushing IV pole) Ambulation/Gait Assistance Details: Worked without RW in an effort to increase the complexity of the task and see how [place] handles it, in hopes of approximating the complexity of managing at home; Ahan was able to at times manage pushing IV pole and dinamap Gait Pattern: Within Functional Limits         PT Goals Acute Rehab PT Goals Time For Goal Achievement: 05/19/11 Potential to Achieve Goals: Good Pt will go Supine/Side to Sit: with modified independence PT Goal: Supine/Side to Sit - Progress: Met Pt will Sit at Edge of Bed: with modified independence PT Goal: Sit at Edge Of Bed - Progress: Met Pt will go Sit to Stand: with modified independence PT Goal: Sit to Stand - Progress: Progressing toward goal Pt will go Stand to Sit: with modified independence PT Goal: Stand to Sit - Progress: Progressing toward goal Pt will Ambulate: 51 - 150 feet;with supervision;with rolling walker PT Goal: Ambulate - Progress: Progressing toward goal Pt will Go Up / Down Stairs: Flight;with modified independence;with rail(s) PT Goal: Up/Down Stairs - Progress: Goal set today  Visit Information  Last PT Received On: 05/13/11 Assistance Needed: +1    Subjective Data  Subjective: Seems happy at the thought of dc'ing to sister's home as opposed to SNF; he correctly identified the flight of steps as biggest barrier Patient Stated Goal: Get better and get healthier   Cognition  Overall Cognitive Status: Appears within functional limits for tasks assessed/performed Arousal/Alertness: Awake/alert Orientation Level: Appears intact for tasks assessed  Behavior During Session: [facility] for tasks performed    Balance     End of Session PT - End of Session Equipment Utilized During Treatment: Oxygen Activity Tolerance: Patient tolerated treatment  well Patient left: in chair;with call bell/phone within reach Nurse Communication: Mobility status    Van Clines [facility] [place], [place] [phone]  05/13/2011, 4:11 PM

## 2011-05-14 ENCOUNTER — Inpatient Hospital Stay (HOSPITAL_COMMUNITY): Payer: Medicaid Other

## 2011-05-14 LAB — CBC
HCT: 33.1 % — ABNORMAL LOW (ref 39.0–52.0)
Hemoglobin: 11.3 g/dL — ABNORMAL LOW (ref 13.0–17.0)
MCV: 89 fL (ref 78.0–100.0)
RBC: 3.72 MIL/uL — ABNORMAL LOW (ref 4.22–5.81)
WBC: 6.6 10*3/uL (ref 4.0–10.5)

## 2011-05-14 LAB — TYPE AND SCREEN
Antibody Screen: NEGATIVE
Unit division: 0

## 2011-05-14 LAB — BASIC METABOLIC PANEL
CO2: 27 mEq/L (ref 19–32)
Chloride: 96 mEq/L (ref 96–112)
Sodium: 131 mEq/L — ABNORMAL LOW (ref 135–145)

## 2011-05-14 MED ORDER — GADOBENATE DIMEGLUMINE 529 MG/ML IV SOLN
10.0000 mL | Freq: Once | INTRAVENOUS | Status: AC
  Administered 2011-05-14: 10 mL via INTRAVENOUS

## 2011-05-14 NOTE — Progress Notes (Signed)
I reviewed MRI of head and orbits  ordered for persistent headache and R retro orbital pain. MRI is significant for atrophy and scattered small white matter hyperintensities that may be due to chronic microvascular ischemia. He also has a blowout fracture of the right medial orbit that appears chronic. There is herniation of orbital fat and medial rectus muscle into the right ethmoid sinus. I discussed the case with Dr. Randon Goldsmith [facility]) who recommends outpatient opthalmology  f/u for this non-acute fracture for a referral to opthalmology plastic and reconstructive surgery at [facility] or [facility] for possible elective repair.  In addition he recommends as needed pain control. I have ordered visual acuity testing to establish a baseline.   Flossie Wexler 05/14/11; 2:17 PM

## 2011-05-14 NOTE — Progress Notes (Signed)
Patient ID: Daniel House, male   DOB: Apr 05, 1975, 36 y.o.   MRN: [id number]  Spoke with Dr. Armen Pickup by phone re: MRI finding of what appears to be a prior medial wall fracture OD, with herniation of some medial orbital contents.  I've recommended that the patient see me in clinic after his discharge from the hospital for full ophthalmic exam including motility exam and dilated funduscopic exam (HIV/AIDS).  My office will coordinate referral at that time to an orbital specialist for further out patient evaluation of the likely longstanding fracture.

## 2011-05-14 NOTE — Progress Notes (Signed)
Feeling better and getting stronger on a daily basis.  Headaches continued to be troublesome and he is at risk for meningitis, so we did MRI.  MRI of brain is fine but he has a surprising blowout fx of his Rt orbit.  (Interesting, his headache has been right retroorbital pain).  He relates no hx of trauma, acute or remote.  Extra occular muscles are intact with no diplopia.  He and I are both puzzled by this finding.  No further treatment is needed acutely.  I doubt he needs any outpatient treatment but it might be worth an ambulatory ENT referral just to make sure.

## 2011-05-14 NOTE — Progress Notes (Signed)
Patient ID: Daniel House, male   DOB: 1975-09-24, 36 y.o.   MRN: [id number]  PGY-1 Daily Progress Note Family Medicine Teaching Service Leary Mcnulty M. Josue Falconi, MD Service Pager: [phone]  Subjective:   Patient continues to report a headache this morning. States pain is in the same place behind his right eye, now with pain when moving his eye.    Objective Temp:  [97.7 F (36.5 C)-99.2 F (37.3 C)] 98.4 F (36.9 C) (04/24 0950) Pulse Rate:  [91-107] 103  (04/24 0950) Resp:  [18-20] 20  (04/24 0950) BP: (115-134)/(72-91) 119/84 mmHg (04/24 0950) SpO2:  [98 %-100 %] 100 % (04/24 0950) Weight:  [105 lb 2.6 oz (47.7 kg)] 105 lb 2.6 oz (47.7 kg) (04/23 2200)   Intake/Output Summary (Last 24 hours) at 05/14/11 1013 Last data filed at 05/14/11 0900  Gross per 24 hour  Intake    755 ml  Output   1750 ml  Net   -995 ml   General: awake, alert, sitting up in bed. [place] in place at 3L. Very pleasant HEENT: Right eye ?swollen (or more closed than left). Pain in right eye with EOM.  CV: RRR, no murmurs Pulm: CTAB  Abd:  Soft, +BS, NTND Ext: mild nonpitting edema of feet bilaterally  Neuro: alert, oriented. No focal deficit  Labs and Imaging:   Lab 05/14/11 0500 05/12/11 0645 05/11/11 0605  WBC 6.6 7.2 6.3  HGB 11.3* 9.3* 8.9*  HCT 33.1* 27.0* 26.3*  PLT 184 222 244    Lab 05/14/11 0500 05/12/11 0645 05/11/11 0605  NA 131* 131* 133*  K 4.5 4.4 4.3  CL 96 99 103  CO2 27 24 22   BUN 14 14 10   CREATININE 0.94 0.89 0.89  CALCIUM 8.8 8.2* 8.0*  PROT -- -- --  BILITOT -- -- --  ALKPHOS -- -- --  ALT -- -- --  AST -- -- --  GLUCOSE 82 88 102*   TSH 0.792 ACE 38 CD4 20 HIV Quant 837,756 LDH 715 Crytpo Neg RPR+, Titer 1:16. TP-PA >8 HepC Ab Pos  Assessment and Plan 36 year old Male with no significant PMHx who presents with 1 week of dyspnea, found to be tachycardic and hypoxic in ER, imagining concerning for interstitial lung disease:   1. Infectious Disease:  A. PCP  Pneumonia - Imaging and clinical picture concerning for PCP pneumonia, or atypical infection. Transferred to SDU on 04/30/11 due to worsening respiratory status. Transferred back to tele floor on 05/04/11. Telemetry monitoring d/c on 05/10/11. Overall patient is continue to improve  - Transitioned to Prednisone from Solumedrol. Steroid taper started on day #10 to 40mg  daily. Now on 20mg  daily and doing well. - Patient has been on Bactrim IV 15 mg/kg of TMP divided q8 hours on started 04/29/11 after not improving on PO.  Transitioned back to PO Bactrim on 05/05/14. Tolerating well. Will continue a total of 21 day course of treatment dose antibiotics. - Continue O2 via [place]. Will start humidified air, as well as saline spray as needed. Continue to wean as necessary (currently doing well at 3L). Continue IS (patient at 1200 now) - Given desats with therapy, albuterol scheduled q 4 now to see if it improves hypoxemia. Will follow up PT notes today about how patient tolerates activity.  B. HIV positive. CD4 count 20. HIV viral load 837K. - ID consulted and has now signed off. We appreciate their excellent care of this patient, as well as arranging for patient to receive HIV  meds. - Antiretroviral therapy per ID. Patient has been approved for outpatient therapy and will be able to receive his medications at time of discharge.  C. Syphillis: - RPR came back reactive, with titer of 1:16.  Confirmation test sent, with TP-PA >8.0.  There is an increased incidence of false + RPR in HIV patients, but given patient's history of syphilis, treated with Penicillin weekly x1 dose, s/p 2 doses.  D. Hep C - positive HCV Ab, no reflex test ordered. Dr. [name] discussed the results of his hepatitis screening with the patient yesterday. He denied knowing what hepatitis was or having spoken to a physician about it before. Explained that this was a virus that could affect his liver and will need to be monitored in the future.  After looking through our records and talking to the lab, no confirmatory testing was ordered by [place].  - Hep C PCR pending   2. Acute renal insufficiency- Noted on admission; may be due to dehydration +/- NSAID use, but again concerning for pulmonary-renal cause of illness or HIVAN. UA significant for spec grav at 1.040, 100 of protein, and red cells on admission. Resolved. - Creatinine within normal limits and stable.      - IVF to saline lock  3. Anemia- HgB improved. No signs of active bleeding. S/p 1 unit PRBC on 05/06/11. Hgb 9.2 post-transfusion. Received another unit on 05/13/11 with post-transfusion CBC 11.3. Patient states he does feel stronger today. - Discussed his HgB with patient . States he is not having frequent stools. Denies dark/bloody bowel movement.  - Continue Iron   4. Yeast infection- Patient with thrush and complaining of pain when he swallows. - Fluconazole PO started on 05/09/11 with 200mg  load and then continued on 100mg  daily - Magic mouthwash with lidocaine for symptomatic relief. (This has maalox, nystatin and benadryl in it which should help) - Improved, but continue fluconazole while on antibiotics   5. Headache- Patient has complained off/on of headache for one week now. He states it comes daily and does not always resolve with Tylenol. Patient now with pain with EOM. - Will get MRI of brain and orbits today given his immunocompromised state and persistent headache  6.. FEN/GI- patient appears malnourished, albumin low at 2.1. Will allow regular diet, give zofran prn, and protonix for GI prophylaxis. NS decrease to 75 cc/hr. Miralax as needed for constipation. - Continue Remeron to help with appetite. Patient reports increased appetite but continues to have weight loss. Can consider adding Megace. - Encourage patient to eat whatever he wants. Continue to monitor I/O and daily weights. 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  DVT ppx- SQ heparin   8. Disposition- Pending further clinical improvement. Will d/c to home with HHPT/HHOT per new recommendations from PT when medically stable.   [name], Eddy Termine Pager: [phone] 05/14/2011, 10:13 AM

## 2011-05-14 NOTE — Progress Notes (Signed)
Clinical Child psychotherapist (CSW) just informed by [facility] that they are able to offer pt a bed today if pt is stable. Facility is aware and agreeable to accept pt with 30 supply of medications provided through the ADAP program. CSW has left a message for Selena Batten the bridge counselor [phone] to provide update. Covering CSW tomorrow will follow up and facilitate with dc when pt stable.  Theresia Bough, MSW, Theresia Majors [phone]

## 2011-05-15 ENCOUNTER — Telehealth: Payer: Self-pay | Admitting: *Deleted

## 2011-05-15 LAB — HEPATITIS C VRS RNA DETECT BY PCR-QUAL: Hepatitis C Vrs RNA by PCR-Qual: NEGATIVE

## 2011-05-15 MED ORDER — MIRTAZAPINE 15 MG PO TABS: 15.0000 mg | ORAL_TABLET | Freq: Every day | ORAL | Status: DC

## 2011-05-15 MED ORDER — PENICILLIN G BENZATHINE 1200000 UNIT/2ML IM SUSP: 2.4000 10*6.[IU] | INTRAMUSCULAR | Status: DC

## 2011-05-15 MED ORDER — MEGESTROL ACETATE 40 MG/ML PO SUSP: 400.0000 mg | Freq: Every day | ORAL | Status: AC

## 2011-05-15 MED ORDER — FLUCONAZOLE 100 MG PO TABS: 100.0000 mg | ORAL_TABLET | Freq: Every day | ORAL | Status: AC

## 2011-05-15 MED ORDER — FERROUS SULFATE 325 (65 FE) MG PO TABS: 325.0000 mg | ORAL_TABLET | Freq: Three times a day (TID) | ORAL | Status: DC

## 2011-05-15 MED ORDER — SULFAMETHOXAZOLE-TMP DS 800-160 MG PO TABS: 2.0000 | ORAL_TABLET | Freq: Three times a day (TID) | ORAL | Status: DC

## 2011-05-15 MED ORDER — MAGIC MOUTHWASH W/LIDOCAINE: 5.0000 mL | Freq: Three times a day (TID) | ORAL | Status: DC | PRN

## 2011-05-15 MED ORDER — ONDANSETRON HCL 4 MG PO TABS: 4.0000 mg | ORAL_TABLET | Freq: Four times a day (QID) | ORAL | Status: AC | PRN

## 2011-05-15 NOTE — Progress Notes (Signed)
Patient ID: Daniel House, male   DOB: 05-16-1975, 36 y.o.   MRN: [id number]  PGY-1 Daily Progress Note Family Medicine Teaching Service Raejean Swinford M. Kyriana Yankee, MD Service Pager: [phone]  Subjective:  Patient doing well today. States he continues to have pain behind eye, but not bothering him right now. Overall he feels much better. He states he ambulates around room without assistance.   Objective Temp:  [98.2 F (36.8 C)-98.9 F (37.2 C)] 98.7 F (37.1 C) (04/25 0449) Pulse Rate:  [78-103] 78  (04/25 0449) Resp:  [18-20] 18  (04/25 0449) BP: (106-121)/(70-88) 121/88 mmHg (04/25 0449) SpO2:  [96 %-100 %] 98 % (04/25 0449) Weight:  [101 lb 3.2 oz (45.904 kg)] 101 lb 3.2 oz (45.904 kg) (04/24 2006)   Intake/Output Summary (Last 24 hours) at 05/15/11 0735 Last data filed at 05/15/11 1478  Gross per 24 hour  Intake   1300 ml  Output   3275 ml  Net  -1975 ml   General: awake, alert, sitting up in bed. [place] in place at 3L. Very pleasant HEENT: Right eye swollen (or more closed than left).  CV: RRR, no murmurs Pulm: CTAB  Abd:  Soft, +BS, NTND Ext: mild nonpitting edema of feet bilaterally  Neuro: alert, oriented. No focal deficit  Labs and Imaging:   Lab 05/14/11 0500 05/12/11 0645 05/11/11 0605  WBC 6.6 7.2 6.3  HGB 11.3* 9.3* 8.9*  HCT 33.1* 27.0* 26.3*  PLT 184 222 244    Lab 05/14/11 0500 05/12/11 0645 05/11/11 0605  NA 131* 131* 133*  K 4.5 4.4 4.3  CL 96 99 103  CO2 27 24 22   BUN 14 14 10   CREATININE 0.94 0.89 0.89  CALCIUM 8.8 8.2* 8.0*  PROT -- -- --  BILITOT -- -- --  ALKPHOS -- -- --  ALT -- -- --  AST -- -- --  GLUCOSE 82 88 102*   TSH 0.792 ACE 38 CD4 20 HIV Quant 837,756 LDH 715 Crytpo Neg RPR+, Titer 1:16. TP-PA >8 HepC Ab Pos  Assessment and Plan 36 year old Male with no significant PMHx who presents with 1 week of dyspnea, found to be tachycardic and hypoxic in ER, imagining concerning for interstitial lung disease:   1. Infectious  Disease:  A. PCP Pneumonia - Imaging and clinical picture concerning for PCP pneumonia, or atypical infection. Transferred to SDU on 04/30/11 due to worsening respiratory status. Transferred back to tele floor on 05/04/11. Telemetry monitoring d/c on 05/10/11. Overall patient is continue to improve  - Transitioned to Prednisone from Solumedrol. Steroid taper started on day #10 to 40mg  daily. Now on 20mg  daily and doing well. - Patient has been on Bactrim IV 15 mg/kg of TMP divided q8 hours on started 04/29/11 after not improving on PO.  Transitioned back to PO Bactrim on 05/05/14. Tolerating well. Will continue a total of 21 day course of treatment dose antibiotics. - Continue O2 via [place]. Will start humidified air, as well as saline spray as needed. Continue to wean as necessary (currently doing well at 3L). Continue IS (patient at 1200 now) - Given desats with therapy, albuterol scheduled q 4 now to see if it improves hypoxemia. Will follow up PT notes today about how patient tolerates activity.  B. HIV positive. CD4 count 20. HIV viral load 837K. - ID consulted and has now signed off. We appreciate their excellent care of this patient, as well as arranging for patient to receive HIV meds. - Antiretroviral therapy per  ID. Patient has been approved for outpatient therapy and will be able to receive his medications at time of discharge.  C. Syphillis: - RPR came back reactive, with titer of 1:16.  Confirmation test sent, with TP-PA >8.0.  There is an increased incidence of false + RPR in HIV patients, but given patient's history of syphilis, treated with Penicillin weekly x1 dose, s/p 2 doses.  D. Hep C - positive HCV Ab, no reflex test ordered. Dr. [name] discussed the results of his hepatitis screening with the patient yesterday. He denied knowing what hepatitis was or having spoken to a physician about it before. Explained that this was a virus that could affect his liver and will need to be monitored  in the future. After looking through our records and talking to the lab, no confirmatory testing was ordered by [place].  - Hep C PCR pending   2. Acute renal insufficiency- Noted on admission; may be due to dehydration +/- NSAID use, but again concerning for pulmonary-renal cause of illness or HIVAN. UA significant for spec grav at 1.040, 100 of protein, and red cells on admission. Resolved. - Creatinine within normal limits and stable.      - IVF to saline lock  3. Anemia- HgB improved. No signs of active bleeding. S/p 1 unit PRBC on 05/06/11. Hgb 9.2 post-transfusion. Received another unit on 05/13/11 with post-transfusion CBC 11.3. Patient states he does feel stronger today. - Discussed his HgB with patient . States he is not having frequent stools. Denies dark/bloody bowel movement.  - Continue Iron   4. Yeast infection- Patient with thrush and complaining of pain when he swallows. - Fluconazole PO started on 05/09/11 with 200mg  load and then continued on 100mg  daily - Magic mouthwash with lidocaine for symptomatic relief. (This has maalox, nystatin and benadryl in it which should help) - Improved, but continue fluconazole while on antibiotics   5. Headache- MRI of brain unremarkable. MRI of orbit shows blow out fracture with herniation of surrounding fat.Patient has complained off/on of headache for one week now. He states it comes daily and does not always resolve with Tylenol. Patient now with pain with EOM. - Pain likely secondary to fracture. Patient unsure of any past trauma - Continue to treat with Tylenol for pain - Will need outpatient opthalmology appointment for further evaluation   6.. FEN/GI- patient appears malnourished, albumin low at 2.1. Will allow regular diet, give zofran prn, and protonix for GI prophylaxis. NS decrease to 75 cc/hr. Miralax as needed for constipation. - Continue Remeron to help with appetite. Patient reports increased appetite but continues to have weight loss.  Can consider adding Megace. - Encourage patient to eat whatever he wants. Continue to monitor I/O and daily weights. 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 DVT ppx- SQ heparin   8. Disposition- Pending further clinical improvement. Will d/c to home with HHPT/HHOT per new recommendations from PT. Will get case management consult today to arrange home health services.  [name], Shelda Truby Pager: [phone] 05/15/2011, 7:35 AM

## 2011-05-15 NOTE — Telephone Encounter (Signed)
I am waiting for the patient to be discharged from the hospital and then I will schedule an appointment for him.  As of this morning, he was still an inpatient. Thanks Asher Muir

## 2011-05-15 NOTE — Progress Notes (Signed)
Pt. Got d/c instruction.prescriptions ready to be pick up at the pharmacy.pt. Got his oxygen tank.

## 2011-05-15 NOTE — Telephone Encounter (Signed)
Received call from pharmacy and they state tht Dr. Mikel Cella sent in RX for Magic Mouth wash for patient and  need to know ratio. Paged Dr. Mikel Cella and she states to cancel this RX.  Pharmacy notified and now they want to know about [place] rx that was sent in  by Dr. Mikel Cella. Will cost patient $ 869.00. Dr. Mikel Cella states to cancel that also. Pharmacy notified.

## 2011-05-15 NOTE — Progress Notes (Signed)
Physical Therapy Treatment Patient Details Name: Daniel House MRN: [id number] DOB: 07-Sep-1975 Today's Date: 05/15/2011 Time: 4098-1191 PT Time Calculation (min): 23 min  PT Assessment / Plan / Recommendation Comments on Treatment Session  Session initiated at 3.5 L/min O2 andwas increased to 4L/min due to tank has no 3.5 setting.  Sats did drop to 85% on 4Lo2 but rebounded back to baseline of 95% within approximately one minute.  Overall has improved significantly since I saw him at the end of last week.  He is in a good place to conside DC home with sister at this time.  Discussed energy conservation technique in approaching flight of steps up to bedroom, and advised frequent rests while on steps.    Follow Up Recommendations  Home health PT;Supervision - Intermittent    Equipment Recommendations  Other (comment)    Frequency Min 3X/week   Plan      Precautions / Restrictions Precautions Precautions: Other (comment) Precaution Comments: monitor O2 sats with activity as he desats easily Restrictions Weight Bearing Restrictions: No Other Position/Activity Restrictions: Desats with activity   Pertinent Vitals/Pain See comments for O2 sat findings.    Mobility  Bed Mobility Bed Mobility: Supine to Sit Supine to Sit: 6: Modified independent (Device/Increase time);HOB elevated Sitting - Scoot to Edge of Bed: 7: Independent Details for Bed Mobility Assistance: moving himself well in bed Transfers Transfers: Sit to Stand;Stand to Sit Sit to Stand: 6: Modified independent (Device/Increase time);From bed Stand to Sit: 5: Supervision;To chair/3-in-1 Details for Transfer Assistance: Pt. demo'ed safe transfers today.  No LOB noted for trnasfers. Ambulation/Gait Ambulation/Gait Assistance: 5: Supervision Ambulation Distance (Feet): 160 Feet Assistive device: None Ambulation/Gait Assistance Details: Pt. steady on feet this am, did not require a standing rest break during walk. Gait  Pattern: Within Functional Limits Gait velocity: somewhat slowed Stairs: No    Exercises     PT Goals Acute Rehab PT Goals PT Goal: Sit to Stand - Progress: Met PT Goal: Stand to Sit - Progress: Met PT Goal: Ambulate - Progress: Met  Visit Information  Last PT Received On: 05/15/11 Assistance Needed: +1 (second person for managing portable O2)    Subjective Data  Subjective: Hoping to go home to sister's later today or tomorrow   Cognition  Overall Cognitive Status: Appears within functional limits for tasks assessed/performed Arousal/Alertness: Awake/alert Orientation Level: Appears intact for tasks assessed Behavior During Session: [facility] for tasks performed    Balance     End of Session PT - End of Session Equipment Utilized During Treatment: Oxygen Activity Tolerance: Patient tolerated treatment well;Other (comment) (with drop in O2 saqts to 85% on 4L o2 during walk) Patient left: in chair;with call bell/phone within reach Nurse Communication: Mobility status    Ferman Hamming 05/15/2011, 10:35 AM Acute Rehabilitation Services [phone] (pager) Acute Rehabilitation Services [phone] (pager)

## 2011-05-15 NOTE — Telephone Encounter (Signed)
Dr. Mikel Cella called to get an appt for this pt who will be new to [place]. She stated Dr. Daiva Eves had told her Daniel House will make the appt. She wants [place] to call the pt with date & time. Forwarded to office manager [facility] anf Tomasita Morrow RN as his diagnosis is [phone]

## 2011-05-15 NOTE — Progress Notes (Signed)
   CARE MANAGEMENT NOTE 05/15/2011  Patient:  Daniel House, Daniel House   Account Number:  [id number]  Date Initiated:  05/06/2011  Documentation initiated by:  Ronny Flurry  Subjective/Objective Assessment:   DX: new 042 , PNA, likely COPD     Action/Plan:   plan home with sister   Anticipated DC Date:  05/16/2011   Anticipated DC Plan:  HOME W HOME HEALTH SERVICES  In-house referral  Clinical Social Worker         [facility] Choice  DURABLE MEDICAL EQUIPMENT  HOME HEALTH   Choice offered to / List presented to:  [name]   DME arranged  [facility]      DME agency  [facility]     HH arranged  HH-1 RN  HH-2 PT  HH-3 OT      [facility] agency  [facility]   Status of service:  Completed, signed off Medicare Important Message given?   (If response is "NO", the following Medicare IM given date fields will be blank) Date Medicare IM given:   Date Additional Medicare IM given:    Discharge Disposition:  HOME W HOME HEALTH SERVICES  Per UR Regulation:  Reviewed for med. necessity/level of care/duration of stay  If discussed at Long Length of Stay Meetings, dates discussed:    Comments:  05/15/2011 Plan now to d/c to home with sister, have asked [facility] to provice home oxygen, hhpt, ot and hhrn initially for assessments and eval. Johny Shock RN MPH Case Manager [phone]

## 2011-05-15 NOTE — Progress Notes (Signed)
Seen and examined.  Agree with Dr. Mikel Cella.  Walk today.  If maintains O2 sats and if home health arrangements can be made, OK to DC on O2.

## 2011-05-15 NOTE — Discharge Summary (Signed)
Seen and examined earlier.  Agree with DC as outlined by Dr. Mikel Cella.

## 2011-05-15 NOTE — Progress Notes (Addendum)
Oxygen saturation was check when pt. Was sitting at room air,it was 93%.After pt. Walks on hallway with out oxygen, Oxygen saturation drop to 88% at ra.

## 2011-05-15 NOTE — Progress Notes (Addendum)
Pt with order for home oxygen. Per notes this pt sats remained > 88% on room air at rest,  On ambulation pt O2 sat dropped to 88% when attempting to ambulate on room air, pt was then placed on 3l/min oxygen by nasal cannula and oxygen sat returned to > 90% .  Home oxygen order is for oxygen @ 3l/min per nasal cannula.  [facility] will provide home oxygen. Daniel Shock RN MPH Case Manager [phone]     CARE MANAGEMENT NOTE 05/15/2011  Patient:  Daniel House, Daniel House   Account Number:  [id number]  Date Initiated:  05/06/2011  Documentation initiated by:  Daniel House  Subjective/Objective Assessment:   DX: new 042 , PNA, likely COPD     Action/Plan:   plan home with sister   Anticipated DC Date:  05/16/2011   Anticipated DC Plan:  HOME W HOME HEALTH SERVICES  In-house referral  Clinical Social Worker         [facility] Choice  DURABLE MEDICAL EQUIPMENT  HOME HEALTH   Choice offered to / List presented to:  [name]   DME arranged  [facility]      DME agency  [facility]     HH arranged  HH-1 RN  HH-2 PT  HH-3 OT      [facility] agency  [facility]   Status of service:  Completed, signed off Medicare Important Message given?   (If response is "NO", the following Medicare IM given date fields will be blank) Date Medicare IM given:   Date Additional Medicare IM given:    Discharge Disposition:  HOME W HOME HEALTH SERVICES  Per UR Regulation:  Reviewed for med. necessity/level of care/duration of stay  If discussed at Long Length of Stay Meetings, dates discussed:    Comments:  05/15/2011 Plan now to d/c to home with sister, have asked [facility] to provice home oxygen, hhpt, ot and hhrn initially for assessments and eval. Daniel Shock RN MPH Case Manager [phone]

## 2011-05-15 NOTE — Progress Notes (Signed)
05/15/2011 3:49 PM  Pt is 88% on RA. Daniel House

## 2011-05-15 NOTE — Progress Notes (Signed)
Nutrition Follow-up  Pt continues on Regular diet. Developed thrush and odynophagia 4/19. Megace added 4/23.  Intake has improved, pt eating mostly 100% of meals. Pt feels better, ate almost entire medium [employer] pizza last night.  Education needs addressed during this visit.  Diet Order:  Regular   Meds: Scheduled Meds:   . antiseptic oral rinse  15 mL Mouth Rinse BID  . azithromycin  1,200 mg Oral Weekly  . darunavir  800 mg Oral Q breakfast  . docusate sodium  100 mg Oral BID  . emtricitabine-tenofovir  1 tablet Oral Daily  . ferrous sulfate  325 mg Oral TID WC  . fluconazole  100 mg Oral Daily  . gadobenate dimeglumine  10 mL Intravenous Once  . megestrol  400 mg Oral Daily  . mirtazapine  15 mg Oral QHS  . penicillin g benzathine (BICILLIN-LA) IM  2.4 Million Units Intramuscular Weekly  . predniSONE  20 mg Oral Q breakfast  . ritonavir  100 mg Oral Q breakfast  . sodium chloride  3 mL Intravenous Q12H  . sulfamethoxazole-trimethoprim  2 tablet Oral Q8H   Continuous Infusions:  PRN Meds:.acetaminophen, albuterol, alum & mag hydroxide-simeth, benzonatate, camphor-menthol, diphenhydrAMINE, magic mouthwash w/lidocaine, ondansetron, oxyCODONE, polyethylene glycol, zolpidem  Labs:  CMP     Component Value Date/Time   NA 131* 05/14/2011 0500   K 4.5 05/14/2011 0500   CL 96 05/14/2011 0500   CO2 27 05/14/2011 0500   GLUCOSE 82 05/14/2011 0500   BUN 14 05/14/2011 0500   CREATININE 0.94 05/14/2011 0500   CALCIUM 8.8 05/14/2011 0500   PROT 6.1 05/03/2011 0500   ALBUMIN 1.8* 05/03/2011 0500   AST 54* 05/03/2011 0500   ALT 17 05/03/2011 0500   ALKPHOS 125* 05/03/2011 0500   BILITOT 0.2* 05/03/2011 0500   GFRNONAA >90 05/14/2011 0500   GFRAA >90 05/14/2011 0500   CBG (last 3)  No results found for this basename: GLUCAP:3 in the last 72 hours   Intake/Output Summary (Last 24 hours) at 05/15/11 1010 Last data filed at 05/15/11 1610  Gross per 24 hour  Intake    960 ml  Output   2125  ml  Net  -1165 ml   Weight Status:  45.9 kg - wt down 5.2 kg x 7 days; however pt admitted at 47.1 kg  Estimated needs:  1500 - 1700 kcal, 70 - 80 grams protein  Nutrition Dx:  Inadequate oral intake r/t poor appetite AEB pt report. Resolved.  Goal:  Meet >/=90% of estimated nutrition needs to promote energy and protein repletion. Met.  Intervention:   1. Provided handout on high-calorie, high-protein diet. Discussed importance of extra calories for weight gain. Pt suspects intake will continue to improve when he is at home and is able to consume the foods he really enjoys. 2. RD to continue to follow.  Monitor:  Weights, labs, PO intake, I/O's  Adair Laundry Pager #:  [phone]

## 2011-05-16 ENCOUNTER — Telehealth: Payer: Self-pay

## 2011-05-16 NOTE — Progress Notes (Signed)
Clinical Social Work-Pt initially for [employer] however, now pt has progressed to safe d/c home-No further CSW needs- Investment banker, corporate

## 2011-05-16 NOTE — Telephone Encounter (Signed)
Nurse for [facility] is calling.  She wants to let Dr. [name] know that he did not have his meds when she got there to do the [facility], but he is going to be getting them today.  I let her know that he is not the PCP, he mostly likely was on service but that I would pass the message along.

## 2011-05-19 ENCOUNTER — Ambulatory Visit (INDEPENDENT_AMBULATORY_CARE_PROVIDER_SITE_OTHER): Payer: Self-pay | Admitting: *Deleted

## 2011-05-19 DIAGNOSIS — A5149 Other secondary syphilitic conditions: Secondary | ICD-10-CM

## 2011-05-19 MED ORDER — PENICILLIN G BENZATHINE 1200000 UNIT/2ML IM SUSP: 2.4000 10*6.[IU] | INTRAMUSCULAR | Status: AC

## 2011-05-19 MED ORDER — PENICILLIN G BENZATHINE 1200000 UNIT/2ML IM SUSP
1.2000 10*6.[IU] | Freq: Once | INTRAMUSCULAR | Status: AC
  Administered 2011-05-19: 1.2 10*6.[IU] via INTRAMUSCULAR

## 2011-05-19 NOTE — Progress Notes (Signed)
Spoke with Dr. Lula Olszewski and she  orders to give Bicillin LA  2.4 million units IM today. Diagnosis: secondary Syphilis. Patient has received 2 doses weekly  while in hospital.   Patient has a decubitus ulcer on sacrum  approx size of  50 cent piece.  He wonders what can put on it. Consulted with Dr Leveda Anna .He advises nothing to put on at this time. Encouraged good nutrition and keep pressure off the area. Has a hospital follow up 05/23/2011 with Dr. [name].   Patient waited in office 20 minutes following injection with no complications.

## 2011-05-20 ENCOUNTER — Telehealth: Payer: Self-pay | Admitting: *Deleted

## 2011-05-20 NOTE — Telephone Encounter (Signed)
Agree with dresssing.  Patient has appointment on this week - the wound should be evaluated then.

## 2011-05-20 NOTE — Telephone Encounter (Signed)
Received call on office voicemail from [facility] at [facility].  She went to do home visit per Dr. Yetta Numbers orders.  Patient states he fell yesterday---was dizzy and lost his balance coming back in to house.  No injury noted or reported from patient.  Patient also had sacral area breakdown.  Nurse checked his backside.  Last week area was reddened but had no open areas.  Today there is an open area on sacrum and patient stated that he had been using peroxide on it.  RN informed patient to d/c using peroxide.  [facility] would like order to use hydrocolloid dressing.  Will page Dr. [name] and call back.  Gaylene Brooks, RN

## 2011-05-20 NOTE — Telephone Encounter (Signed)
Returned call to [employer] and verbal order given for hydrocolloid dressing.  Will evaluate wound at office visit on Friday (05/23/11).  Gaylene Brooks, RN

## 2011-05-21 ENCOUNTER — Telehealth: Payer: Self-pay | Admitting: *Deleted

## 2011-05-21 NOTE — Telephone Encounter (Signed)
Daniel House was seen at [facility] and Dr. Randon Goldsmith wants to refer him to Dr. Shawna Orleans for a large medical wall fracture with herniation found on MRI during recent hospital stay.  The patient has medicaid so the referral has to come from his primary doctor.  Daniel House has a hospital follow up appointment with Dr. [name] on 05/23/11.  Will have him put in the referral then.  Ileana Ladd

## 2011-05-22 ENCOUNTER — Encounter: Payer: Self-pay | Admitting: *Deleted

## 2011-05-22 ENCOUNTER — Encounter: Payer: Self-pay | Admitting: Internal Medicine

## 2011-05-22 ENCOUNTER — Other Ambulatory Visit: Payer: Self-pay | Admitting: *Deleted

## 2011-05-22 ENCOUNTER — Ambulatory Visit (INDEPENDENT_AMBULATORY_CARE_PROVIDER_SITE_OTHER): Payer: Self-pay | Admitting: Internal Medicine

## 2011-05-22 VITALS — BP 123/85 | HR 125 | Temp 97.6°F | Ht 72.0 in | Wt 106.0 lb

## 2011-05-22 DIAGNOSIS — B20 Human immunodeficiency virus [HIV] disease: Secondary | ICD-10-CM

## 2011-05-22 DIAGNOSIS — B370 Candidal stomatitis: Secondary | ICD-10-CM

## 2011-05-22 DIAGNOSIS — A539 Syphilis, unspecified: Secondary | ICD-10-CM

## 2011-05-22 DIAGNOSIS — B1920 Unspecified viral hepatitis C without hepatic coma: Secondary | ICD-10-CM

## 2011-05-22 DIAGNOSIS — B3781 Candidal esophagitis: Secondary | ICD-10-CM

## 2011-05-22 MED ORDER — SULFAMETHOXAZOLE-TMP DS 800-160 MG PO TABS: 1.0000 | ORAL_TABLET | Freq: Two times a day (BID) | ORAL | Status: DC

## 2011-05-22 MED ORDER — ENSURE PO LIQD: 237.0000 mL | Freq: Two times a day (BID) | ORAL | Status: DC

## 2011-05-22 NOTE — Assessment & Plan Note (Signed)
He was started during his hospitalization on Prezista, norvir, and Truvada. He has not missed any doses in the week he has been out. He also is taking his Bactrim 3 times a day, prednisone and weekly azithromycin. He will continue the treatment dose for 10 more days and then continue with Bactrim double strength once daily. I will check his labs today to see that his virus is decreasing and I will have him have labs again in one month in followup within 2 weeks later. I also have given him a prescription for Ensure that he will get through [facility].

## 2011-05-22 NOTE — Progress Notes (Signed)
  Subjective:    Patient ID: Daniel House, male    DOB: 15-Jun-1975, 36 y.o.   MRN: [id number]  HPI This is a recently diagnosed patient with HIV coming in here for his first clinic visit. He had presented to the hospital last month with shortness of breath and workup was revealing for presumed PCP pneumonia and AIDS. His CD4 count was 20 viral load was over 800,000. His previous HIV test was years ago and was negative. He denies IV drug use. He has sex with men and women. No history of gonorrhea or chlamydia. He is currently being treated for PCP pneumonia with 10 days left in his course of Bactrim and prednisone, he also is on treatment for esophageal candidiasis that is presumed due to his difficulty swallowing, and he is on prophylaxis for mycobacterial disease with weekly and azithromycin. He has had about a 40 pound weight loss over the last year. Today though he tells me he feels much better than he did during his hospitalization.   Review of Systems  Constitutional: Positive for fatigue and unexpected weight change. Negative for fever and chills.       His appetite is much improved and he is eating well, he does overall feel much better  HENT: Positive for trouble swallowing. Negative for sore throat, mouth sores and dental problem.   Respiratory:       He has some shortness of breath with activity but overall feels better with oxygen  Cardiovascular: Negative.   Gastrointestinal: Negative.   Neurological: Negative.   Hematological: Negative.   Psychiatric/Behavioral: Negative.        Objective:   Physical Exam  Constitutional:       Thin and chronically ill-appearing  HENT:       Some thrush on his tongue but not in his oropharynx  Eyes: No scleral icterus.  Cardiovascular:       Tachycardic with no murmurs  Pulmonary/Chest: Effort normal and breath sounds normal. No respiratory distress. He has no wheezes. He has no rales.       Patient continues on nasal cannula  Abdominal:  Soft. Bowel sounds are normal. He exhibits no distension. There is no tenderness. There is no rebound.  Genitourinary: Penis normal. No penile tenderness.       Sacral wound noted and is covered at this time, no surrounding erythema  Musculoskeletal: Normal range of motion.  Lymphadenopathy:    He has no cervical adenopathy.  Skin: No rash noted.          Assessment & Plan:

## 2011-05-22 NOTE — Assessment & Plan Note (Signed)
His viral load was negative and therefore is not active disease

## 2011-05-22 NOTE — Assessment & Plan Note (Signed)
The patient was treated during the hospitalization and was going to get a followup #3 on April 29

## 2011-05-22 NOTE — Assessment & Plan Note (Signed)
He will continue with fluconazole

## 2011-05-23 ENCOUNTER — Ambulatory Visit (INDEPENDENT_AMBULATORY_CARE_PROVIDER_SITE_OTHER): Payer: Self-pay | Admitting: Family Medicine

## 2011-05-23 ENCOUNTER — Encounter: Payer: Self-pay | Admitting: Family Medicine

## 2011-05-23 VITALS — BP 114/82 | HR 131 | Temp 97.1°F | Ht 72.0 in | Wt 104.6 lb

## 2011-05-23 DIAGNOSIS — L89152 Pressure ulcer of sacral region, stage 2: Secondary | ICD-10-CM

## 2011-05-23 DIAGNOSIS — L89109 Pressure ulcer of unspecified part of back, unspecified stage: Secondary | ICD-10-CM

## 2011-05-23 DIAGNOSIS — S02839A Fracture of medial orbital wall, unspecified side, initial encounter for closed fracture: Secondary | ICD-10-CM

## 2011-05-23 DIAGNOSIS — L8992 Pressure ulcer of unspecified site, stage 2: Secondary | ICD-10-CM

## 2011-05-23 DIAGNOSIS — B20 Human immunodeficiency virus [HIV] disease: Secondary | ICD-10-CM

## 2011-05-23 DIAGNOSIS — R64 Cachexia: Secondary | ICD-10-CM

## 2011-05-23 DIAGNOSIS — H3500 Unspecified background retinopathy: Secondary | ICD-10-CM

## 2011-05-23 DIAGNOSIS — S0280XA Fracture of other specified skull and facial bones, unspecified side, initial encounter for closed fracture: Secondary | ICD-10-CM

## 2011-05-23 DIAGNOSIS — L8990 Pressure ulcer of unspecified site, unspecified stage: Secondary | ICD-10-CM

## 2011-05-23 DIAGNOSIS — B59 Pneumocystosis: Secondary | ICD-10-CM

## 2011-05-23 LAB — COMPREHENSIVE METABOLIC PANEL
ALT: 47 U/L (ref 0–53)
CO2: 20 mEq/L (ref 19–32)
Chloride: 100 mEq/L (ref 96–112)
Potassium: 4.4 mEq/L (ref 3.5–5.3)
Sodium: 129 mEq/L — ABNORMAL LOW (ref 135–145)
Total Bilirubin: 0.5 mg/dL (ref 0.3–1.2)
Total Protein: 6.2 g/dL (ref 6.0–8.3)

## 2011-05-23 LAB — CBC WITH DIFFERENTIAL/PLATELET
Eosinophils Absolute: 0.1 10*3/uL (ref 0.0–0.7)
Lymphocytes Relative: 13 % (ref 12–46)
Lymphs Abs: 0.4 10*3/uL — ABNORMAL LOW (ref 0.7–4.0)
Neutro Abs: 2.2 10*3/uL (ref 1.7–7.7)
Neutrophils Relative %: 77 % (ref 43–77)
Platelets: 151 10*3/uL (ref 150–400)
RBC: 4.14 MIL/uL — ABNORMAL LOW (ref 4.22–5.81)
WBC: 2.9 10*3/uL — ABNORMAL LOW (ref 4.0–10.5)

## 2011-05-23 LAB — T-HELPER CELL (CD4) - (RCID CLINIC ONLY): CD4 T Cell Abs: 20 uL — ABNORMAL LOW (ref 400–2700)

## 2011-05-23 LAB — TOXOPLASMA ANTIBODIES- IGG AND  IGM
Toxoplasma Antibody- IgM: 0.2 IV (ref <0.90)
Toxoplasma IgG Ratio: 0.7 IU/mL (ref <6.4)

## 2011-05-23 LAB — HEPATITIS A ANTIBODY, TOTAL: Hep A Total Ab: POSITIVE — AB

## 2011-05-23 NOTE — Telephone Encounter (Signed)
I have spoken to Dr. Randon Goldsmith. I will fax the referral to Dr. Dimas Millin.

## 2011-05-26 ENCOUNTER — Telehealth: Payer: Self-pay | Admitting: *Deleted

## 2011-05-26 NOTE — Telephone Encounter (Signed)
I spoke with Daniel Sandy, RN from [facility] regarding the patient's condition. She was concerned about the pain in his chest. Therefore, I called Daniel House. He states the he has pain on the left side of his back that he believes is related to how he had been lying on the couch. He states that the pain comes and goes and is not associated with shortness of breath on increase oxygen requirement. Currently, he is on 3L via nasal canula. He was instructed to come to the ED at [facility] if the pain is persistent or has has shortness of breath with an increased oxygen requirement. Daniel House was in agreement with the plan.

## 2011-05-26 NOTE — Telephone Encounter (Signed)
Beth with [facility] calling.  States she went out to see Mr. Hane today.  His resting heart rate was in the low 100's.  No shortness of breath, but is complaining of pain that starts in his left mid back and radiates around to his left chest.  Hard to get a real good feel for what makes the pain better or worse.  Has been going on for 2 days.  Worse when he takes a deep breath.  Asking if she needs to do anything further.  Paged to Dr. [name].  He is going to call Beth at [phone].  Ileana Ladd

## 2011-05-27 NOTE — Progress Notes (Signed)
[facility] enrolled patient into the bridge counseling program on 05/22/11. [facility] will provide HIV education, treatment adherence and transportation.

## 2011-05-30 ENCOUNTER — Encounter (HOSPITAL_COMMUNITY): Payer: Self-pay | Admitting: *Deleted

## 2011-05-30 ENCOUNTER — Emergency Department (HOSPITAL_COMMUNITY): Payer: Medicaid Other

## 2011-05-30 ENCOUNTER — Inpatient Hospital Stay (HOSPITAL_COMMUNITY)
Admission: EM | Admit: 2011-05-30 | Discharge: 2011-06-12 | DRG: 682 | Disposition: A | Discharge status: Home / Self Care | Payer: Medicaid Other | Source: Ambulatory Visit | Attending: Family Medicine | Admitting: Family Medicine

## 2011-05-30 DIAGNOSIS — N19 Unspecified kidney failure: Secondary | ICD-10-CM

## 2011-05-30 DIAGNOSIS — E872 Acidosis, unspecified: Secondary | ICD-10-CM | POA: Diagnosis present

## 2011-05-30 DIAGNOSIS — R0902 Hypoxemia: Secondary | ICD-10-CM | POA: Diagnosis present

## 2011-05-30 DIAGNOSIS — E875 Hyperkalemia: Secondary | ICD-10-CM

## 2011-05-30 DIAGNOSIS — J811 Chronic pulmonary edema: Secondary | ICD-10-CM | POA: Diagnosis present

## 2011-05-30 DIAGNOSIS — R5381 Other malaise: Secondary | ICD-10-CM

## 2011-05-30 DIAGNOSIS — L89152 Pressure ulcer of sacral region, stage 2: Secondary | ICD-10-CM | POA: Diagnosis present

## 2011-05-30 DIAGNOSIS — R5383 Other fatigue: Secondary | ICD-10-CM

## 2011-05-30 DIAGNOSIS — D696 Thrombocytopenia, unspecified: Secondary | ICD-10-CM | POA: Diagnosis present

## 2011-05-30 DIAGNOSIS — E88A Wasting disease (syndrome) due to underlying condition: Secondary | ICD-10-CM | POA: Diagnosis present

## 2011-05-30 DIAGNOSIS — Z87891 Personal history of nicotine dependence: Secondary | ICD-10-CM

## 2011-05-30 DIAGNOSIS — K921 Melena: Secondary | ICD-10-CM | POA: Diagnosis present

## 2011-05-30 DIAGNOSIS — E871 Hypo-osmolality and hyponatremia: Secondary | ICD-10-CM | POA: Diagnosis present

## 2011-05-30 DIAGNOSIS — D61818 Other pancytopenia: Secondary | ICD-10-CM | POA: Diagnosis present

## 2011-05-30 DIAGNOSIS — D638 Anemia in other chronic diseases classified elsewhere: Secondary | ICD-10-CM | POA: Diagnosis present

## 2011-05-30 DIAGNOSIS — Z681 Body mass index (BMI) 19 or less, adult: Secondary | ICD-10-CM

## 2011-05-30 DIAGNOSIS — R64 Cachexia: Secondary | ICD-10-CM | POA: Diagnosis present

## 2011-05-30 DIAGNOSIS — L8992 Pressure ulcer of unspecified site, stage 2: Secondary | ICD-10-CM | POA: Diagnosis present

## 2011-05-30 DIAGNOSIS — B20 Human immunodeficiency virus [HIV] disease: Secondary | ICD-10-CM

## 2011-05-30 DIAGNOSIS — A539 Syphilis, unspecified: Secondary | ICD-10-CM | POA: Diagnosis present

## 2011-05-30 DIAGNOSIS — N179 Acute kidney failure, unspecified: Secondary | ICD-10-CM | POA: Diagnosis present

## 2011-05-30 DIAGNOSIS — N170 Acute kidney failure with tubular necrosis: Principal | ICD-10-CM | POA: Diagnosis present

## 2011-05-30 DIAGNOSIS — B59 Pneumocystosis: Secondary | ICD-10-CM | POA: Diagnosis present

## 2011-05-30 DIAGNOSIS — L89109 Pressure ulcer of unspecified part of back, unspecified stage: Secondary | ICD-10-CM | POA: Diagnosis present

## 2011-05-30 DIAGNOSIS — M549 Dorsalgia, unspecified: Secondary | ICD-10-CM | POA: Diagnosis present

## 2011-05-30 DIAGNOSIS — E46 Unspecified protein-calorie malnutrition: Secondary | ICD-10-CM | POA: Diagnosis present

## 2011-05-30 DIAGNOSIS — Z79899 Other long term (current) drug therapy: Secondary | ICD-10-CM

## 2011-05-30 DIAGNOSIS — N189 Chronic kidney disease, unspecified: Secondary | ICD-10-CM | POA: Diagnosis present

## 2011-05-30 HISTORY — DX: Chronic kidney disease, unspecified: N18.9

## 2011-05-30 HISTORY — DX: Pneumocystosis: B59

## 2011-05-30 HISTORY — DX: Pneumonia, unspecified organism: J18.9

## 2011-05-30 MED ORDER — SODIUM CHLORIDE 0.9 % IV BOLUS (SEPSIS)
1000.0000 mL | Freq: Once | INTRAVENOUS | Status: AC
  Administered 2011-05-30: 1000 mL via INTRAVENOUS

## 2011-05-30 NOTE — ED Notes (Signed)
Current EKG and previous EKG printed and given to EDP Knapp for review.

## 2011-05-30 NOTE — ED Provider Notes (Signed)
History     CSN: [id number]  Arrival date & time 05/30/11  2216   First MD Initiated Contact with Patient 05/30/11 2302      Chief Complaint  Patient presents with  . Shortness of Breath    (Consider location/radiation/quality/duration/timing/severity/associated sxs/prior treatment) HPI This is a 36 year old black male with a history of AIDS. He was recently admitted to [facility] for pneumonia. And he was discharged home but began having worsening shortness of breath and generalized weakness about 3 days ago. It is progressively worsened and is now moderate to severe. It is worse with exertion, even minor exertion. He states his chest feels tight is not painful. He has had decreased appetite and intake. He has had one episode of emesis. He has not had any diarrhea. He denies fever or chills. He was noted to be tachycardic prior to arrival an IV fluid bolus was started; he states this has not changed how he feels. He also relates that his left foot has become numb since yesterday. His CD4 count was noted to be 20 on the second of this month.  Past Medical History  Diagnosis Date  . HIV infection     Past Surgical History  Procedure Date  . Appendectomy     History reviewed. No pertinent family history.  History  Substance Use Topics  . Smoking status: Former Smoker    Quit date: 04/21/2011  . Smokeless tobacco: Never Used  . Alcohol Use: 0.0 oz/week     just recently stopped alcohol      Review of Systems  All other systems reviewed and are negative.    Allergies  Review of patient's allergies indicates no known allergies.  Home Medications   Current Outpatient Rx  Name Route Sig Dispense Refill  . ALBUTEROL SULFATE HFA 108 (90 BASE) MCG/ACT IN AERS Inhalation Inhale 2 puffs into the lungs every 4 (four) hours as needed. For shortness of breath     . AZITHROMYCIN 600 MG PO TABS Oral Take 2 tablets (1,200 mg total) by mouth every 7 (seven) days. 8 tablet 11  .  DARUNAVIR ETHANOLATE 800 MG PO TABS Oral Take 1 tablet (800 mg total) by mouth daily. 30 tablet 11  . EMTRICITABINE-TENOFOVIR 200-300 MG PO TABS Oral Take 1 tablet by mouth daily. 30 tablet 11  . ENSURE PO LIQD Oral Take 237 mLs by mouth 2 (two) times daily between meals. 237 mL 11  . FERROUS SULFATE 325 (65 FE) MG PO TABS Oral Take 1 tablet (325 mg total) by mouth 3 (three) times daily with meals. 90 tablet 0  . MEGESTROL ACETATE 40 MG/ML PO SUSP Oral Take 10 mLs (400 mg total) by mouth daily. 240 mL 0  . MIRTAZAPINE 15 MG PO TABS Oral Take 1 tablet (15 mg total) by mouth at bedtime. 30 tablet 0  . NAPROXEN SODIUM 220 MG PO TABS Oral Take 220 mg by mouth daily as needed. For pain    . PREDNISONE 20 MG PO TABS  Take two tablets twice daily for 5 days, then one tablet daily for 5 days then one half tablet daily for 11 days 32 tablet 0  . RITONAVIR 100 MG PO TABS Oral Take 1 tablet (100 mg total) by mouth daily. 30 tablet 11  . SULFAMETHOXAZOLE-TMP DS 800-160 MG PO TABS Oral Take 1 tablet by mouth 2 (two) times daily. After completion of the PCP treatment dosing 30 tablet 5  . SULFAMETHOXAZOLE-TRIMETHOPRIM 800-160 MG PO TABS Oral Take  2 tablets by mouth 3 (three) times daily. 140 tablet 11  . SULFAMETHOXAZOLE-TMP DS 800-160 MG PO TABS Oral Take 2 tablets by mouth every 8 (eight) hours. 40 tablet 0    BP 106/66  Pulse 106  Temp(Src) 97.8 F (36.6 C) (Oral)  Resp 18  SpO2 96%  Physical Exam General: Well-developed, cachectic male in no acute distress; appearance consistent with age of record HENT: normocephalic, atraumatic; normal-appearing oropharynx without evidence of thrush Eyes: pupils equal round and reactive to light; extraocular muscles intact Neck: supple Heart: regular rate and rhythm; tachycardia Lungs: clear to auscultation bilaterally; mild tachypnea Abdomen: Soft; scaphoid; no hepatosplenomegaly; bowel sounds present Extremities: No deformity; full range of motion; pulses  normal Neurologic: Awake, alert and oriented; motor function intact in all extremities and symmetric; no facial droop; decreased sensation left foot Skin: Warm and dry Psychiatric: Normal mood and affect   ED Course  Procedures (including critical care time)  CRITICAL CARE Performed by: Cherri Yera L   Total critical care time: 30 minutes  Critical care time was exclusive of separately billable procedures and treating other patients.  Critical care was necessary to treat or prevent imminent or life-threatening deterioration.  Critical care was time spent personally by me on the following activities: development of treatment plan with patient and/or surrogate as well as nursing, discussions with consultants, evaluation of patient's response to treatment, examination of patient, obtaining history from patient or surrogate, ordering and performing treatments and interventions, ordering and review of laboratory studies, ordering and review of radiographic studies, pulse oximetry and re-evaluation of patient's condition.    MDM   Nursing notes and vitals signs, including pulse oximetry, reviewed.  Summary of this visit's results, reviewed by myself:  Labs:  Results for orders placed during the hospital encounter of 05/30/11  CBC      Component Value Range   WBC 7.4  4.0 - 10.5 (K/uL)   RBC 3.53 (*) 4.22 - 5.81 (MIL/uL)   Hemoglobin 10.8 (*) 13.0 - 17.0 (g/dL)   HCT 16.1 (*) 09.6 - 52.0 (%)   MCV 87.5  78.0 - 100.0 (fL)   MCH 30.6  26.0 - 34.0 (pg)   MCHC 35.0  30.0 - 36.0 (g/dL)   RDW 04.5 (*) 40.9 - 15.5 (%)   Platelets 56 (*) 150 - 400 (K/uL)  DIFFERENTIAL      Component Value Range   Neutrophils Relative 84 (*) 43 - 77 (%)   Lymphocytes Relative 9 (*) 12 - 46 (%)   Monocytes Relative 7  3 - 12 (%)   Eosinophils Relative 0  0 - 5 (%)   Basophils Relative 0  0 - 1 (%)   Neutro Abs 6.2  1.7 - 7.7 (K/uL)   Lymphs Abs 0.7  0.7 - 4.0 (K/uL)   Monocytes Absolute 0.5  0.1 -  1.0 (K/uL)   Eosinophils Absolute 0.0  0.0 - 0.7 (K/uL)   Basophils Absolute 0.0  0.0 - 0.1 (K/uL)   WBC Morphology MILD LEFT SHIFT (1-5% METAS, OCC MYELO, OCC BANDS)     Smear Review PENDING PATHOLOGIST REVIEW    COMPREHENSIVE METABOLIC PANEL      Component Value Range   Sodium 123 (*) 135 - 145 (mEq/L)   Potassium 7.5 (*) 3.5 - 5.1 (mEq/L)   Chloride 95 (*) 96 - 112 (mEq/L)   CO2 13 (*) 19 - 32 (mEq/L)   Glucose, Bld 101 (*) 70 - 99 (mg/dL)   BUN 91 (*) 6 -  23 (mg/dL)   Creatinine, Ser 1.61 (*) 0.50 - 1.35 (mg/dL)   Calcium 7.6 (*) 8.4 - 10.5 (mg/dL)   Total Protein 6.8  6.0 - 8.3 (g/dL)   Albumin 2.4 (*) 3.5 - 5.2 (g/dL)   AST 14  0 - 37 (U/L)   ALT 21  0 - 53 (U/L)   Alkaline Phosphatase 124 (*) 39 - 117 (U/L)   Total Bilirubin 0.4  0.3 - 1.2 (mg/dL)   GFR calc non Af Amer 18 (*) >90 (mL/min)   GFR calc Af Amer 21 (*) >90 (mL/min)  LACTATE DEHYDROGENASE      Component Value Range   LDH 234  94 - 250 (U/L)    Imaging Studies: Dg Chest 2 View  05/30/2011  *RADIOLOGY REPORT*  Clinical Data: Shortness of breath.  CHEST - 2 VIEW  Comparison: Plain film of the chest 05/02/2011 and 05/12/2011.  CT chest 04/27/2011.  Findings: Hazy bilateral airspace disease seen on the most recent exam appears markedly improved.  No consolidative process, pneumothorax or effusion is identified.  Heart size is normal.  IMPRESSION: Near complete resolution of hazy bilateral airspace opacities.  No new abnormality.  Original Report Authenticated By: Bernadene Bell. D'ALESSIO, M.D.    EKG Interpretation:  Date & Time: 05/30/2011 22:30  Rate: 124   Rhythm: sinus tachycardia  QRS Axis: normal  Intervals: normal  ST/T Wave abnormalities: Tall peaked T waves  Conduction Disutrbances:nonspecific intraventricular conduction delay  Narrative Interpretation:   Old EKG Reviewed: changes noted  2:07 AM Calcium chloride, sodium bicarbonate, D50 and insulin ordered for critically high potassium.  2:43 AM Will  transfer to [facility] for admission. Discussed with Dr. Gwenlyn Saran.            Hanley Seamen, MD 05/31/11 [phone]

## 2011-05-30 NOTE — ED Notes (Signed)
[id number]<AV> Expected date:<BR> Expected time:10:15 PM<BR> Means of arrival:<BR> Comments:<BR> M33 - 36yoM SOB, pneumonia hx 96% RA

## 2011-05-30 NOTE — ED Notes (Signed)
Pt brought in via ems and taken to room 14.

## 2011-05-31 ENCOUNTER — Encounter (HOSPITAL_COMMUNITY): Payer: Self-pay | Admitting: *Deleted

## 2011-05-31 ENCOUNTER — Inpatient Hospital Stay (HOSPITAL_COMMUNITY): Payer: Medicaid Other

## 2011-05-31 DIAGNOSIS — B59 Pneumocystosis: Secondary | ICD-10-CM

## 2011-05-31 DIAGNOSIS — B20 Human immunodeficiency virus [HIV] disease: Secondary | ICD-10-CM | POA: Insufficient documentation

## 2011-05-31 DIAGNOSIS — K921 Melena: Secondary | ICD-10-CM | POA: Diagnosis present

## 2011-05-31 DIAGNOSIS — M549 Dorsalgia, unspecified: Secondary | ICD-10-CM | POA: Diagnosis present

## 2011-05-31 DIAGNOSIS — H3500 Unspecified background retinopathy: Secondary | ICD-10-CM | POA: Insufficient documentation

## 2011-05-31 DIAGNOSIS — L89152 Pressure ulcer of sacral region, stage 2: Secondary | ICD-10-CM | POA: Insufficient documentation

## 2011-05-31 DIAGNOSIS — S02839A Fracture of medial orbital wall, unspecified side, initial encounter for closed fracture: Secondary | ICD-10-CM | POA: Insufficient documentation

## 2011-05-31 HISTORY — DX: Melena: K92.1

## 2011-05-31 LAB — BASIC METABOLIC PANEL
BUN: 92 mg/dL — ABNORMAL HIGH (ref 6–23)
CO2: 13 mEq/L — ABNORMAL LOW (ref 19–32)
CO2: 18 mEq/L — ABNORMAL LOW (ref 19–32)
Calcium: 7.5 mg/dL — ABNORMAL LOW (ref 8.4–10.5)
Calcium: 6.9 mg/dL — ABNORMAL LOW (ref 8.4–10.5)
Chloride: 99 mEq/L (ref 96–112)
Creatinine, Ser: 4.07 mg/dL — ABNORMAL HIGH (ref 0.50–1.35)
GFR calc Af Amer: 20 mL/min — ABNORMAL LOW (ref >90)
GFR calc Af Amer: 20 mL/min — ABNORMAL LOW (ref >90)
GFR calc Af Amer: 20 mL/min — ABNORMAL LOW (ref >90)
GFR calc non Af Amer: 17 mL/min — ABNORMAL LOW (ref >90)
GFR calc non Af Amer: 17 mL/min — ABNORMAL LOW (ref >90)
Potassium: 6.7 mEq/L (ref 3.5–5.1)
Potassium: 4.7 mEq/L (ref 3.5–5.1)
Sodium: 125 mEq/L — ABNORMAL LOW (ref 135–145)
Sodium: 128 mEq/L — ABNORMAL LOW (ref 135–145)
Sodium: 129 mEq/L — ABNORMAL LOW (ref 135–145)

## 2011-05-31 LAB — COMPREHENSIVE METABOLIC PANEL
ALT: 21 U/L (ref 0–53)
AST: 14 U/L (ref 0–37)
Alkaline Phosphatase: 124 U/L — ABNORMAL HIGH (ref 39–117)
CO2: 13 mEq/L — ABNORMAL LOW (ref 19–32)
Calcium: 7.6 mg/dL — ABNORMAL LOW (ref 8.4–10.5)
GFR calc non Af Amer: 18 mL/min — ABNORMAL LOW (ref >90)
Glucose, Bld: 101 mg/dL — ABNORMAL HIGH (ref 70–99)
Potassium: 7.5 mEq/L (ref 3.5–5.1)
Sodium: 123 mEq/L — ABNORMAL LOW (ref 135–145)

## 2011-05-31 LAB — URINE MICROSCOPIC-ADD ON

## 2011-05-31 LAB — DIFFERENTIAL
Basophils Relative: 0 % (ref 0–1)
Eosinophils Absolute: 0 10*3/uL (ref 0.0–0.7)
Monocytes Absolute: 0.5 10*3/uL (ref 0.1–1.0)
Neutro Abs: 6.2 10*3/uL (ref 1.7–7.7)
Neutrophils Relative %: 84 % — ABNORMAL HIGH (ref 43–77)

## 2011-05-31 LAB — SODIUM, URINE, RANDOM: Sodium, Ur: 73 mEq/L

## 2011-05-31 LAB — URINALYSIS, ROUTINE W REFLEX MICROSCOPIC
Glucose, UA: 100 mg/dL — AB
Protein, ur: 100 mg/dL — AB

## 2011-05-31 LAB — CARDIAC PANEL(CRET KIN+CKTOT+MB+TROPI)
CK, MB: 2.3 ng/mL (ref 0.3–4.0)
CK, MB: 2.6 ng/mL (ref 0.3–4.0)
Relative Index: INVALID (ref 0.0–2.5)
Total CK: 51 U/L (ref 7–232)
Total CK: 60 U/L (ref 7–232)
Troponin I: <0.3 ng/mL (ref <0.30)

## 2011-05-31 LAB — MRSA PCR SCREENING: MRSA by PCR: NEGATIVE

## 2011-05-31 LAB — CBC
Hemoglobin: 10.8 g/dL — ABNORMAL LOW (ref 13.0–17.0)
MCH: 30.6 pg (ref 26.0–34.0)
MCHC: 35 g/dL (ref 30.0–36.0)

## 2011-05-31 LAB — PROTIME-INR: Prothrombin Time: 16.2 seconds — ABNORMAL HIGH (ref 11.6–15.2)

## 2011-05-31 MED ORDER — AZITHROMYCIN 600 MG PO TABS
1200.0000 mg | ORAL_TABLET | ORAL | Status: DC
  Administered 2011-05-31 – 2011-06-07 (×2): 1200 mg via ORAL
  Filled 2011-05-31 (×2): qty 2

## 2011-05-31 MED ORDER — DARUNAVIR ETHANOLATE 800 MG PO TABS
800.0000 mg | ORAL_TABLET | Freq: Every day | ORAL | Status: DC
  Administered 2011-05-31: 800 mg via ORAL
  Filled 2011-05-31: qty 1

## 2011-05-31 MED ORDER — RITONAVIR 100 MG PO TABS
100.0000 mg | ORAL_TABLET | Freq: Every day | ORAL | Status: DC
  Administered 2011-06-01 – 2011-06-12 (×12): 100 mg via ORAL
  Filled 2011-05-31 (×14): qty 1

## 2011-05-31 MED ORDER — DAPSONE 100 MG PO TABS
100.0000 mg | ORAL_TABLET | Freq: Every day | ORAL | Status: DC
  Administered 2011-05-31 – 2011-06-06 (×7): 100 mg via ORAL
  Filled 2011-05-31 (×8): qty 1

## 2011-05-31 MED ORDER — SODIUM POLYSTYRENE SULFONATE 15 GM/60ML PO SUSP
60.0000 g | Freq: Once | ORAL | Status: DC
  Filled 2011-05-31: qty 240

## 2011-05-31 MED ORDER — FENTANYL CITRATE 0.05 MG/ML IJ SOLN
50.0000 ug | Freq: Once | INTRAMUSCULAR | Status: AC
  Administered 2011-05-31: 50 ug via INTRAVENOUS
  Filled 2011-05-31: qty 2

## 2011-05-31 MED ORDER — ONDANSETRON HCL 4 MG/2ML IJ SOLN
4.0000 mg | Freq: Once | INTRAMUSCULAR | Status: AC
  Administered 2011-05-31: 4 mg via INTRAVENOUS
  Filled 2011-05-31: qty 2

## 2011-05-31 MED ORDER — INSULIN ASPART 100 UNIT/ML ~~LOC~~ SOLN
10.0000 [IU] | Freq: Once | SUBCUTANEOUS | Status: AC
  Administered 2011-05-31: 10 [IU] via INTRAVENOUS
  Filled 2011-05-31: qty 1

## 2011-05-31 MED ORDER — DARUNAVIR ETHANOLATE 800 MG PO TABS
800.0000 mg | ORAL_TABLET | Freq: Every day | ORAL | Status: DC
  Administered 2011-06-01 – 2011-06-12 (×12): 800 mg via ORAL
  Filled 2011-05-31 (×15): qty 1

## 2011-05-31 MED ORDER — ENSURE COMPLETE PO LIQD
237.0000 mL | Freq: Two times a day (BID) | ORAL | Status: DC
  Filled 2011-05-31 (×8): qty 237

## 2011-05-31 MED ORDER — LAMIVUDINE 150 MG PO TABS
150.0000 mg | ORAL_TABLET | Freq: Every day | ORAL | Status: DC
  Administered 2011-05-31 – 2011-06-08 (×9): 150 mg via ORAL
  Filled 2011-05-31 (×11): qty 1

## 2011-05-31 MED ORDER — DEXTROSE 50 % IV SOLN
1.0000 | Freq: Once | INTRAVENOUS | Status: AC
  Administered 2011-05-31: 50 mL via INTRAVENOUS
  Filled 2011-05-31: qty 50

## 2011-05-31 MED ORDER — SODIUM POLYSTYRENE SULFONATE 15 GM/60ML PO SUSP
30.0000 g | Freq: Once | ORAL | Status: AC | PRN
  Filled 2011-05-31: qty 120

## 2011-05-31 MED ORDER — SODIUM CHLORIDE 0.9 % IV BOLUS (SEPSIS)
1000.0000 mL | Freq: Once | INTRAVENOUS | Status: AC
  Administered 2011-05-31: 1000 mL via INTRAVENOUS

## 2011-05-31 MED ORDER — ZIDOVUDINE 100 MG PO CAPS
300.0000 mg | ORAL_CAPSULE | Freq: Two times a day (BID) | ORAL | Status: DC
  Administered 2011-05-31 – 2011-06-06 (×12): 300 mg via ORAL
  Filled 2011-05-31 (×15): qty 3

## 2011-05-31 MED ORDER — SODIUM BICARBONATE 8.4 % IV SOLN
50.0000 meq | Freq: Once | INTRAVENOUS | Status: AC
  Administered 2011-05-31: 50 meq via INTRAVENOUS
  Filled 2011-05-31: qty 50

## 2011-05-31 MED ORDER — ADULT MULTIVITAMIN W/MINERALS CH
1.0000 | ORAL_TABLET | Freq: Every day | ORAL | Status: DC
  Administered 2011-05-31 – 2011-06-12 (×13): 1 via ORAL
  Filled 2011-05-31 (×13): qty 1

## 2011-05-31 MED ORDER — PROMETHAZINE HCL 25 MG/ML IJ SOLN
12.5000 mg | INTRAMUSCULAR | Status: DC | PRN
  Filled 2011-05-31: qty 1

## 2011-05-31 MED ORDER — MIRTAZAPINE 15 MG PO TABS
15.0000 mg | ORAL_TABLET | Freq: Every day | ORAL | Status: DC
  Administered 2011-05-31 – 2011-06-11 (×12): 15 mg via ORAL
  Filled 2011-05-31 (×14): qty 1

## 2011-05-31 MED ORDER — ACETAMINOPHEN 325 MG PO TABS
650.0000 mg | ORAL_TABLET | Freq: Four times a day (QID) | ORAL | Status: DC | PRN
  Administered 2011-06-01 – 2011-06-09 (×5): 650 mg via ORAL
  Filled 2011-05-31 (×5): qty 2

## 2011-05-31 MED ORDER — CALCIUM CHLORIDE 10 % IV SOLN: 1.0000 g | Freq: Once | INTRAVENOUS | Status: DC

## 2011-05-31 MED ORDER — SODIUM CHLORIDE 0.9 % IV SOLN
1.0000 g | Freq: Once | INTRAVENOUS | Status: AC
  Administered 2011-05-31: 1 g via INTRAVENOUS
  Filled 2011-05-31: qty 10

## 2011-05-31 MED ORDER — RITONAVIR 100 MG PO TABS
100.0000 mg | ORAL_TABLET | Freq: Every day | ORAL | Status: DC
  Administered 2011-05-31: 100 mg via ORAL
  Filled 2011-05-31: qty 1

## 2011-05-31 MED ORDER — FERROUS SULFATE 325 (65 FE) MG PO TABS
325.0000 mg | ORAL_TABLET | Freq: Three times a day (TID) | ORAL | Status: DC
  Administered 2011-05-31 – 2011-06-08 (×22): 325 mg via ORAL
  Filled 2011-05-31 (×30): qty 1

## 2011-05-31 MED ORDER — FLUCONAZOLE 100 MG PO TABS
100.0000 mg | ORAL_TABLET | ORAL | Status: DC
  Administered 2011-05-31 – 2011-06-07 (×2): 100 mg via ORAL
  Filled 2011-05-31 (×2): qty 1

## 2011-05-31 MED ORDER — EMTRICITABINE-TENOFOVIR DF 200-300 MG PO TABS
1.0000 | ORAL_TABLET | Freq: Every day | ORAL | Status: DC
  Administered 2011-05-31: 1 via ORAL
  Filled 2011-05-31: qty 1

## 2011-05-31 MED ORDER — DEXTROSE 5 % IV SOLN
INTRAVENOUS | Status: AC
  Administered 2011-05-31 – 2011-06-01 (×4): via INTRAVENOUS
  Filled 2011-05-31 (×13): qty 150

## 2011-05-31 MED ORDER — ALBUTEROL SULFATE HFA 108 (90 BASE) MCG/ACT IN AERS
2.0000 | INHALATION_SPRAY | RESPIRATORY_TRACT | Status: DC | PRN
  Filled 2011-05-31: qty 6.7

## 2011-05-31 MED ORDER — MEGESTROL ACETATE 40 MG/ML PO SUSP
400.0000 mg | Freq: Every day | ORAL | Status: DC
  Administered 2011-05-31 – 2011-06-12 (×13): 400 mg via ORAL
  Filled 2011-05-31 (×13): qty 10

## 2011-05-31 MED ORDER — ACETAMINOPHEN 650 MG RE SUPP: 650.0000 mg | Freq: Four times a day (QID) | RECTAL | Status: DC | PRN

## 2011-05-31 NOTE — Consult Note (Signed)
In a Infectious Diseases Initial Consultation         Date of Admission:  05/30/2011  Date of Consult:  05/31/2011  Reason for Consult: Acute renal insufficiency in the setting of recently diagnosed HIV infection and pneumocystis pneumonia Referring Physician: : Dr. Harvel Quale McGill   Problem List:  Active Problems:  Acute renal failure  HIV disease  Back pain   Recommendations: 1. Discontinue Truvada 2. Start renally adjusted Epivir and Combivir and continue along with Prezista and Norvir 3. Start dapsone for pneumocystis prophylaxis 4. Continue azithromycin for Mycobacterium avium prophylaxis   Assessment: Daniel House has developed acute renal insufficiency. This could be related to his Aleve, trimethoprim sulfamethoxazole, and/or the tenofovir component of Truvada. I suspect that his pneumocystis pneumonia has been successfully treated and will start dapsone for pneumocystis prophylaxis. I will change Truvada to have severe and Combivir and continue along with Prezista and Norvir.     HPI: Daniel House is a 36 y.o. male who was hospitalized last month with pneumonia. He was found to have HIV infection with CD4 count of 20 and pneumocystis pneumonia. He improved with treatment with trimethoprim sulfamethoxazole and prednisone. He was also started on antiretroviral therapy with Truvada, Prezista and Norvir. He was doing better and was seen back in clinic by my partner, Dr. Merceda Elks on May 2. Over the past week he has developed nausea and vomiting and was admitted yesterday with acute renal insufficiency and hyperkalemia. In addition to his HIV medicines he has been taking Aleve recently because of back pain.   Review of Systems: Pertinent items are noted in HPI.     Marland Kitchen azithromycin  1,200 mg Oral Q7 days  . calcium gluconate  1 g Intravenous Once  . dextrose  1 ampule Intravenous Once  . feeding supplement  237 mL Oral BID BM  . fentaNYL  50 mcg Intravenous Once  . ferrous  sulfate  325 mg Oral TID WC  . insulin aspart  10 Units Intravenous Once  . megestrol  400 mg Oral Daily  . mirtazapine  15 mg Oral QHS  . mulitivitamin with minerals  1 tablet Oral Daily  . ondansetron (ZOFRAN) IV  4 mg Intravenous Once  . sodium bicarbonate  50 mEq Intravenous Once  . sodium chloride  1,000 mL Intravenous Once  . sodium chloride  1,000 mL Intravenous Once  . sodium polystyrene  60 g Oral Once  . DISCONTD: calcium chloride  1 g Intravenous Once  . DISCONTD: Darunavir Ethanolate  800 mg Oral Daily  . DISCONTD: emtricitabine-tenofovir  1 tablet Oral Daily  . DISCONTD: ritonavir  100 mg Oral Daily    Past Medical History  Diagnosis Date  . HIV infection 04/2011  . Chronic kidney disease   . Pneumonia 04/2011    PCP pneumonia     History  Substance Use Topics  . Smoking status: Former Smoker    Quit date: 04/21/2011  . Smokeless tobacco: Never Used  . Alcohol Use: 0.0 oz/week     just recently stopped alcohol    History reviewed. No pertinent family history. No Known Allergies  OBJECTIVE: Blood pressure 106/75, pulse 100, temperature 98.2 F (36.8 C), temperature source Oral, resp. rate 21, height 6' (1.829 m), weight 43.4 kg (95 lb 10.9 oz), SpO2 99.00%. General: Thin, alert and in no distress Skin: No rash Lungs: Clear Cor: Regular S1 and S2 no murmurs Abdomen:  Soft and nontender  Labs: HIV 1 RNA Quant (copies/mL)  Date Value  05/22/2011 3667*  04/29/2011 [id number]*     CD4 T Cell Abs (cmm)  Date Value  05/22/2011 20*  04/29/2011 20*    Lab Results  Component Value Date   CREATININE 4.03* 05/31/2011   BUN 92* 05/31/2011   NA 127* 05/31/2011   K 6.7* 05/31/2011   CL 99 05/31/2011   CO2 13* 05/31/2011    Microbiology: Recent Results (from the past 240 hour(s))  MRSA PCR SCREENING     Status: Normal   Collection Time   05/31/11  5:09 AM      Component Value Range Status Comment   MRSA by PCR NEGATIVE  NEGATIVE  Final     Cliffton Asters, MD [facility] [phone] pager   [phone] cell 05/31/2011, 2:46 PM

## 2011-05-31 NOTE — Progress Notes (Signed)
INITIAL ADULT NUTRITION ASSESSMENT Date: 05/31/2011   Time: 1:14 PM  Reason for Assessment: Consult  ASSESSMENT: Male 36 y.o.  Dx: HIV/AIDS, acute renal failure, chest pain & SOB  Hx:  Past Medical History  Diagnosis Date  . HIV infection 04/2011  . Chronic kidney disease   . Pneumonia 04/2011    PCP pneumonia     Related Meds:     . azithromycin  1,200 mg Oral Q7 days  . calcium gluconate  1 g Intravenous Once  . dextrose  1 ampule Intravenous Once  . feeding supplement  237 mL Oral BID BM  . fentaNYL  50 mcg Intravenous Once  . ferrous sulfate  325 mg Oral TID WC  . insulin aspart  10 Units Intravenous Once  . megestrol  400 mg Oral Daily  . mirtazapine  15 mg Oral QHS  . ondansetron (ZOFRAN) IV  4 mg Intravenous Once  . sodium bicarbonate  50 mEq Intravenous Once  . sodium chloride  1,000 mL Intravenous Once  . sodium chloride  1,000 mL Intravenous Once  . sodium polystyrene  60 g Oral Once  . DISCONTD: calcium chloride  1 g Intravenous Once  . DISCONTD: Darunavir Ethanolate  800 mg Oral Daily  . DISCONTD: emtricitabine-tenofovir  1 tablet Oral Daily  . DISCONTD: ritonavir  100 mg Oral Daily     Ht: 6' (182.9 cm)  Wt: 95 lb 10.9 oz (43.4 kg)  Ideal Wt: 81 kg % Ideal Wt: 54%  Usual Wt: 106 lb -- per office visit record 05/22/11 % Usual Wt: 90%  Body mass index is 12.98 kg/(m^2).  Food/Nutrition Related Hx: unintentional weight loss > 10 lbs within the last month & appears severely malnourished per admission nutrition screen  Labs:  CMP     Component Value Date/Time   NA 127* 05/31/2011 0850   K 6.7* 05/31/2011 0850   CL 99 05/31/2011 0850   CO2 13* 05/31/2011 0850   GLUCOSE 91 05/31/2011 0850   BUN 92* 05/31/2011 0850   CREATININE 4.03* 05/31/2011 0850   CREATININE 1.03 05/22/2011 1451   CALCIUM 7.1* 05/31/2011 0850   PROT 6.8 05/30/2011 2319   ALBUMIN 2.4* 05/30/2011 2319   AST 14 05/30/2011 2319   ALT 21 05/30/2011 2319   ALKPHOS 124* 05/30/2011 2319   BILITOT 0.4 05/30/2011 2319   GFRNONAA 18* 05/31/2011 0850   GFRAA 20* 05/31/2011 0850     Intake/Output Summary (Last 24 hours) at 05/31/11 1318 Last data filed at 05/31/11 1221  Gross per 24 hour  Intake   1310 ml  Output    275 ml  Net   1035 ml    Diet Order: General  Supplements/Tube Feeding: Ensure Complete BID, Megace  IVF:    sodium bicarbonate infusion 1000 mL Last Rate: 200 mL/hr at 05/31/11 0833    Estimated Nutritional Needs:   Kcal:  Protein:  Fluid:   Patient recently diagnosed HIV positive who presents from home with complaint of 4 day intermittent L sided chest pain and weakness; RD spoke with patient re: nutrition hx -- states his appetite is currently not very good; no % meals eaten in flowsheets  Patient reports he was consuming his regular food choices PTA; he reports weight loss but is unable to quantify amount; per office visit records, patient has had a 40 lb weight loss x 1 year; patient with visible muscle wasting to temples, shoulders & clavicles  Patient meets criteria for malnutrition in the context of  chronic illness given 29% weight loss x 1 year and severe muscle loss  Wound consult pending for sacral decubitus ulcer  NUTRITION DIAGNOSIS: -Inadequate oral intake (NI-2.1).  Status: Ongoing  RELATED TO: poor appetite, catabolic illness  AS EVIDENCE BY: patient report  MONITORING/EVALUATION(Goals): Goal: Oral intake to meet >90% of estimated nutrition needs Monitor: PO intake, weight, labs, I/O's  EDUCATION NEEDS: -No education needs identified at this time  INTERVENTION:  Continue Ensure Complete PO BID  Add MVI daily  RD to follow for nutrition care plan  Dietitian #: [phone]  DOCUMENTATION CODES Per approved criteria  -Severe malnutrition in the context of chronic illness -Underweight    Daniel House 05/31/2011, 1:14 PM

## 2011-05-31 NOTE — Assessment & Plan Note (Signed)
The patient was referred to Dr. Shawna Orleans, an oculoplastic surgeon, for evaluation.

## 2011-05-31 NOTE — Progress Notes (Signed)
Pt advised of need for medication kayexalate. IV antemetic meds given. Pt unable to take medication. MD advised. Md to consult and advise.Will continue to monitor and advise attending as needed.

## 2011-05-31 NOTE — ED Notes (Signed)
Report given to [facility] awaiting arrival. Pt informed.

## 2011-05-31 NOTE — Progress Notes (Signed)
CRITICAL VALUE ALERT  Critical value received:  K+ 7.1   Date of notification:  05/31/2011  Time of notification:  0745  Critical value read back:yes  Nurse who received alert:  Vickie Epley, RN  MD notified (1st page):  Dr. Gwenlyn Saran  Responding MD:  Dr. Gwenlyn Saran Time MD responded:  [phone]

## 2011-05-31 NOTE — H&P (Signed)
Daniel House is an 36 y.o. male.   PCP: Roslynn Amble, [facility]  Chief Complaint: Weakness and shortness of breath  HPI:  36 yo M with recently diagnosed AIDs (HIV positive and PCP pneumonia) presents from home with complaint of 4 days of intermittent L sided chest pain and weakness. He describes his chest pain as "tightness", associated with breathing, but also associated with laying on the left side. He has shortness of breath, worst with minimal activity, which has worsened in the last 3 days. He reports subjective weight loss and decreased oral intake in the last week, associated with nausea that started yesterday. Denies any vomiting.  Has been having generalized weakness and fatigue.  He reports taking his antiretroviral medications as well as the bactrim for PCP. He was discharged from the hospital in April 2012 where he was treated for PCP pneumonia.  In the ED, he was found to have an elevated potassium to 7.5 with EKG changes with peaked T waves and widening QRS. He received calcium gluconate, D50, insulin, sodium bicarb and 1L NS bolus.     Past Medical History  Diagnosis Date  . HIV infection 04/2011  . Chronic kidney disease   . Pneumonia 04/2011    PCP pneumonia     Past Surgical History  Procedure Date  . Appendectomy 1996    History reviewed. No pertinent family history. Social History:  reports that he quit smoking about 5 weeks ago. He has never used smokeless tobacco. He reports that he drinks alcohol. He reports that he does not use illicit drugs. He lives at home with his sister.  Code Status: he is unsure of what he prefers at this point.   Allergies: No Known Allergies  Medications Prior to Admission  Medication Sig Dispense Refill  . albuterol (PROVENTIL HFA;VENTOLIN HFA) 108 (90 BASE) MCG/ACT inhaler Inhale 2 puffs into the lungs every 4 (four) hours as needed. For shortness of breath       . azithromycin (ZITHROMAX) 600 MG tablet Take 2  tablets (1,200 mg total) by mouth every 7 (seven) days.  8 tablet  11  . Darunavir Ethanolate (PREZISTA) 800 MG tablet Take 1 tablet (800 mg total) by mouth daily.  30 tablet  11  . emtricitabine-tenofovir (TRUVADA) 200-300 MG per tablet Take 1 tablet by mouth daily.  30 tablet  11  . ENSURE (ENSURE) Take 237 mLs by mouth 2 (two) times daily between meals.  237 mL  11  . ferrous sulfate 325 (65 FE) MG tablet Take 1 tablet (325 mg total) by mouth 3 (three) times daily with meals.  90 tablet  0  . megestrol (MEGACE) 40 MG/ML suspension Take 10 mLs (400 mg total) by mouth daily.  240 mL  0  . mirtazapine (REMERON) 15 MG tablet Take 1 tablet (15 mg total) by mouth at bedtime.  30 tablet  0  . naproxen sodium (ANAPROX) 220 MG tablet Take 220 mg by mouth daily as needed. For pain      . predniSONE (DELTASONE) 20 MG tablet Take two tablets twice daily for 5 days, then one tablet daily for 5 days then one half tablet daily for 11 days  32 tablet  0  . ritonavir (NORVIR) 100 MG TABS Take 1 tablet (100 mg total) by mouth daily.  30 tablet  11  . sulfamethoxazole-trimethoprim (BACTRIM DS) 800-160 MG per tablet Take 1 tablet by mouth 2 (two) times daily. After completion of the PCP  treatment dosing  30 tablet  5  . sulfamethoxazole-trimethoprim (BACTRIM DS) 800-160 MG per tablet Take 2 tablets by mouth 3 (three) times daily.  140 tablet  11  . sulfamethoxazole-trimethoprim (BACTRIM DS) 800-160 MG per tablet Take 2 tablets by mouth every 8 (eight) hours.  40 tablet  0   Pertinent Labs CBC: 7.4>10.8/30.9<56  Neutrophil 84% Lymph 9% Na 123 K 7.5  Cl 95 CO2 13 Glucose 101 BUN 91 Cr 3.93  GFR 21   Pertinent Studies: CXR  05/30/11: Findings: Hazy bilateral airspace disease seen on the most recent exam appears markedly improved.  No consolidative process, pneumothorax or effusion is identified.  Heart size is normal.  IMPRESSION: Near complete resolution of hazy bilateral airspace opacities.  No new  abnormality.  EKG 05/30/11 NSR, HR 124, peaked T waves, QRS 146. QTc 465.   Review of Systems  Constitutional: Positive for weight loss. Negative for fever.       Weakness is non focal. Describes it as generalized.   Respiratory: Positive for shortness of breath. Negative for cough and wheezing.        Shortness of breath with minimal activity  Cardiovascular: Positive for chest pain. Negative for palpitations and leg swelling.  Gastrointestinal: Positive for nausea. Negative for vomiting, abdominal pain, diarrhea and constipation.  Genitourinary: Negative for dysuria, urgency and frequency.  Musculoskeletal: Positive for back pain.       Back pain across his lower back.   Skin: Negative for rash.  Neurological: Positive for tingling and weakness. Negative for dizziness and headaches.       Tingling in his legs with some associated numbness    Blood pressure 108/67, pulse 102, temperature 97.6 F (36.4 C), temperature source Oral, resp. rate 23, height 6' (1.829 m), weight 95 lb 10.9 oz (43.4 kg), SpO2 100.00%. Physical Exam  Constitutional: He is oriented to person, place, and time. No distress.       Cachectic   HENT:       Small ulcer on left side of tongue. No thrush.   Eyes: Conjunctivae and EOM are normal. Pupils are equal, round, and reactive to light.  Neck: Normal range of motion. Neck supple.  Cardiovascular: Regular rhythm and normal heart sounds.   No murmur heard.      Tachycardic to the 100's  Respiratory: Effort normal and breath sounds normal. He has no wheezes. He exhibits no tenderness.  GI: Soft. Bowel sounds are normal. He exhibits no distension. There is no tenderness. There is no rebound and no guarding.  Musculoskeletal: Normal range of motion. He exhibits no edema and no tenderness.  Lymphadenopathy:    He has no cervical adenopathy.  Neurological: He is alert and oriented to person, place, and time.       CN2-12 grossly intact. 4/5 strength in lower  extremities bilaterally. strong hand grip bilaterally.  Decreased sensation to light touch in lower extremities bilaterally. Negative Babinski.   Skin: Skin is dry. No rash noted.     Assessment/Plan 36 yo male with h/o newly diagnosed HIV/AIDS (CD4 count of 20 on 05/22/11) who presents with worsening shortness of breath, weakness and intermittent chest pain and who was admitted for hyperkalemia, acute renal failure and metabolic acidosis. # Acute renal failure: likely secondary to combination of poor oral intake and low volume status as well as recent use of bactrim and naproxen.  - will give another 1L NS bolus - obtain urine sodium and creatinine for FeNa - renal US  -  hold bactrim and will consult ID for alternative PCP prophylaxis.  - greatly appreciate Dr. Eliane Decree recommendations with nephrology  # Hyperkalemia: s/p calcium gluconate, D50, insulin, sodium bicarb in the ED. - repeat stat BMP  - Kayexelate 60mg   - continue tele monitoring in the setting of EKG changes - will follow nephrology's recommendation regarding any emergent dialysis based on upcoming potassium trend.   # Metabolic Acidosis: likely secondary to renal failure - start isotonic bicarb per Dr. Eliane Decree recommendations.   # Hyponatremia: likely secondary to low volume and renal failure.  - continue IV fluids  # Chest pain and shortness of breath: CXR appears improved from previous and doesn't show any evidence of new pneumonia.  no ST elevation on EKG, but will obtain cardiac enzymes to rule out acute MI. Pulmonary Embolus is part of the differential but his Wells criteria shows low probability and he doesn't have any evidence of DVT on exam. D-dimer in his case would be elevated secondary to renal failure and a CTA would not be possible given renal function. VQ scan would perhaps not be accurate in the setting of recent pneumonia. Will continue monitoring and consider further workup if this persists and clinical  picture changes. - tele monitoring on step down    # Thrombocytopenia: decrease since early May. No petechia or bruising on exam.  - follow up peripheral smear for any evidence of schistocytes.  - obtain PT/INR  # HIV/AIDS: CD4 count of 20 on 05/22/11 and viral load 837K on 05/03/11 - continue home antiviral therapy - continue azithromycin  - hold bactrim until further recommendation from ID - consult ID - follow up CD4 count and viral load  # Malnutrition: low albumin and cachectic on exam, secondary to AIDS wasting - continue megace and mirtazapine - continue ensure supplements - consider nutrition consult  # Fen/Gi:  Isotonic bicarb at 200cc/hr  Regular diet if patient can tolerate  # Prophylaxis:  -SCD's given low platelet count   # Dispo: admitted to step down unit. Pending improvement of electrolytes and acidosis.   Marena Chancy, PGY1 Family Medicine Teaching Service  [phone]  I examined the patient with Dr. Gwenlyn Saran. I have reviewed the note, made necessary revisions and agree with above.   Daniel House 05/31/2011, 6:07 AM

## 2011-05-31 NOTE — Progress Notes (Signed)
  Subjective:    Patient ID: Daniel House, male    DOB: 1975/04/29, 36 y.o.   MRN: [id number]  HPI Daniel House is a 36 year old man with newly diagnosed AIDS, HIV and PCP pneumonia who presents for a follow-up after his hospitalization.   1. Respiratory - Daniel House is still using 3L via nasal canula. He feels like he is improving, but cannot tolerate any activity including ADL's without dyspnea. He is working with PT at his house. He is taking Bactrim.   2. HIV/AIDS - He has picked up all of his HIV medication and is taking it according to his ID physician's instructions.   3. Orbital Wall Fracture, Medial Blowout of the Right - Daniel House was seen by his ophthalmologist last week who recommended a referral to an oculoplastic surgeon for repair. Currently, Daniel House denies vision trouble or difficulty moving his eyes. He also denies headache.   4. Sacral Ulcer - It is uncertain whether this developed during his hospitalization or since his discharge on 4/25. Nonetheless, it is very painful for the patient to sit on it. He has been applying neosporin cream to it. However, it is not improving.    Review of Systems Positive for increased appetite; nausea; 1 mechanical fall Negative: all other systems    Objective:   Physical Exam BP 114/82  Pulse 131  Temp(Src) 97.1 F (36.2 C) (Oral)  Ht 6' (1.829 m)  Wt 104 lb 9.6 oz (47.446 kg)  BMI 14.19 kg/m2 Gen: very cachectic AAM, pleasant, very frail and becomes dyspneic with any movement, holding O2 cannister Mouth: no oral thrush Cardiac: RRR, no murmur Lungs: Generalized rales, normal WOB at rest, increased WOB with movement Skin: stage 2 sacral decubitus ulcer, 4x2 cm, no evidence of infection      Assessment & Plan:  Daniel House is a 36 year old male with AIDS and resolving PCP pneumonia who has developed a decubitus ulcer and needs a further work-up for his orbital fracture.

## 2011-05-31 NOTE — Progress Notes (Signed)
Pt unable to take Kayexelate; started dry heaving and gagging on first sip; MD paged; returned page; updated MD on new lab values and pt unable to tolerate meds; suggested giving pt Zofran and then allowing pt to attempt to take Kayexelate.  Lavone Orn

## 2011-05-31 NOTE — Assessment & Plan Note (Signed)
A hydrocolloid dressing was placed over the ulcer and an extra dressing was given to the patient to change in several days. He was encouraged to sit on a pillow as well. He will follow-up in 2 weeks to have it re-assessed.

## 2011-05-31 NOTE — ED Notes (Signed)
Pt out of ED with [facility] to transport to [place].

## 2011-05-31 NOTE — ED Notes (Signed)
Pt request sister to be called when lab results are in. Damareon Lanni @ [phone]

## 2011-05-31 NOTE — Consult Note (Signed)
Reason for Consult: Acute renal failure with hyperkalemia Referring Physician: Paula Libra MD-ER physician  Daniel House is an 36 y.o. male.  HPI: Daniel House is a 36 year old African American man with recently diagnosed HIV/AIDS (recent CD4 count 20 and HIV viral load 800,000) who is on treatment for pneumocystis pneumonia with Bactrim. He presented to the emergency room with one week of progressively worsening nausea, abdominal discomfort and yesterday was progressively short of breath with decreased tolerance to activities of daily living.  In addition to taking Bactrim, he is taking Aleve to help relief of back pain. He denies using any over-the-counter nonsteroidal anti-inflammatory drugs and has not had any recent intravenous contrast administration. Reports a poor oral intake due to his nausea and has been experiencing orthostatic dizziness. He denies any dysuria urgency or frequency. Denies any prior history of acute renal failure, kidney stones or urinary tract infections. Denies any personal history of autoimmune disease.  Labs in the emergency room yesterday night indicated critical hyperkalemia of 7.5 with associated EKG changes of peaked T waves and widening QRS interval. Acute management was done by administration of 50% dextrose, insulin and cardiac stabilization with calcium gluconate. He also has had one liter fluid bolus and is awaiting his second liter. He has in front of him about 250 cc of urine in his urinal.  Past Medical History  Diagnosis Date  . HIV infection 04/2011  . Chronic kidney disease   . Pneumonia 04/2011    PCP pneumonia     Past Surgical History  Procedure Date  . Appendectomy 1996    History reviewed. No pertinent family history.  Social History:  reports that he quit smoking about 5 weeks ago. He has never used smokeless tobacco. He reports that he drinks alcohol. He reports that he does not use illicit drugs.  Allergies: No Known Allergies  Medications:    Scheduled:   . azithromycin  1,200 mg Oral Q7 days  . calcium gluconate  1 g Intravenous Once  . Darunavir Ethanolate  800 mg Oral Daily  . dextrose  1 ampule Intravenous Once  . emtricitabine-tenofovir  1 tablet Oral Daily  . feeding supplement  237 mL Oral BID BM  . fentaNYL  50 mcg Intravenous Once  . ferrous sulfate  325 mg Oral TID WC  . insulin aspart  10 Units Intravenous Once  . megestrol  400 mg Oral Daily  . mirtazapine  15 mg Oral QHS  . ritonavir  100 mg Oral Daily  . sodium bicarbonate  50 mEq Intravenous Once  . sodium chloride  1,000 mL Intravenous Once  . sodium chloride  1,000 mL Intravenous Once  . sodium polystyrene  60 g Oral Once  . DISCONTD: calcium chloride  1 g Intravenous Once    Results for orders placed during the hospital encounter of 05/30/11 (from the past 48 hour(s))  CBC     Status: Abnormal   Collection Time   05/30/11 11:19 PM      Component Value Range Comment   WBC 7.4  4.0 - 10.5 (K/uL)    RBC 3.53 (*) 4.22 - 5.81 (MIL/uL)    Hemoglobin 10.8 (*) 13.0 - 17.0 (g/dL)    HCT 82.9 (*) 56.2 - 52.0 (%)    MCV 87.5  78.0 - 100.0 (fL)    MCH 30.6  26.0 - 34.0 (pg)    MCHC 35.0  30.0 - 36.0 (g/dL)    RDW 13.0 (*) 86.5 - 15.5 (%)  Platelets 56 (*) 150 - 400 (K/uL)   DIFFERENTIAL     Status: Abnormal   Collection Time   05/30/11 11:19 PM      Component Value Range Comment   Neutrophils Relative 84 (*) 43 - 77 (%)    Lymphocytes Relative 9 (*) 12 - 46 (%)    Monocytes Relative 7  3 - 12 (%)    Eosinophils Relative 0  0 - 5 (%)    Basophils Relative 0  0 - 1 (%)    Neutro Abs 6.2  1.7 - 7.7 (K/uL)    Lymphs Abs 0.7  0.7 - 4.0 (K/uL)    Monocytes Absolute 0.5  0.1 - 1.0 (K/uL)    Eosinophils Absolute 0.0  0.0 - 0.7 (K/uL)    Basophils Absolute 0.0  0.0 - 0.1 (K/uL)    WBC Morphology MILD LEFT SHIFT (1-5% METAS, OCC MYELO, OCC BANDS)      Smear Review PENDING PATHOLOGIST REVIEW     COMPREHENSIVE METABOLIC PANEL     Status: Abnormal    Collection Time   05/30/11 11:19 PM      Component Value Range Comment   Sodium 123 (*) 135 - 145 (mEq/L)    Potassium 7.5 (*) 3.5 - 5.1 (mEq/L)    Chloride 95 (*) 96 - 112 (mEq/L)    CO2 13 (*) 19 - 32 (mEq/L)    Glucose, Bld 101 (*) 70 - 99 (mg/dL)    BUN 91 (*) 6 - 23 (mg/dL)    Creatinine, Ser 4.09 (*) 0.50 - 1.35 (mg/dL)    Calcium 7.6 (*) 8.4 - 10.5 (mg/dL)    Total Protein 6.8  6.0 - 8.3 (g/dL)    Albumin 2.4 (*) 3.5 - 5.2 (g/dL)    AST 14  0 - 37 (U/L)    ALT 21  0 - 53 (U/L)    Alkaline Phosphatase 124 (*) 39 - 117 (U/L)    Total Bilirubin 0.4  0.3 - 1.2 (mg/dL)    GFR calc non Af Amer 18 (*) >90 (mL/min)    GFR calc Af Amer 21 (*) >90 (mL/min)   LACTATE DEHYDROGENASE     Status: Normal   Collection Time   05/30/11 11:19 PM      Component Value Range Comment   LDH 234  94 - 250 (U/L)     Dg Chest 2 View  05/30/2011  *RADIOLOGY REPORT*  Clinical Data: Shortness of breath.  CHEST - 2 VIEW  Comparison: Plain film of the chest 05/02/2011 and 05/12/2011.  CT chest 04/27/2011.  Findings: Hazy bilateral airspace disease seen on the most recent exam appears markedly improved.  No consolidative process, pneumothorax or effusion is identified.  Heart size is normal.  IMPRESSION: Near complete resolution of hazy bilateral airspace opacities.  No new abnormality.  Original Report Authenticated By: Bernadene Bell. Maricela Curet, M.D.    Review of Systems  Constitutional: Positive for chills, weight loss and malaise/fatigue. Negative for fever and diaphoresis.  HENT: Negative.  Negative for neck pain.   Eyes: Negative.   Respiratory: Positive for cough, sputum production and shortness of breath. Negative for hemoptysis and wheezing.   Cardiovascular: Positive for palpitations. Negative for chest pain, orthopnea, claudication, leg swelling and PND.  Gastrointestinal: Positive for nausea, vomiting, abdominal pain and blood in stool. Negative for heartburn, diarrhea, constipation and melena.        Reports 1 episode of hematochezia 5 days ago (straining at passing stool)  Genitourinary: Negative.  Musculoskeletal: Positive for myalgias and back pain. Negative for joint pain and falls.  Skin: Negative.   Neurological: Positive for dizziness, sensory change and weakness. Negative for tingling, tremors, speech change, focal weakness, seizures and loss of consciousness.  Endo/Heme/Allergies: Negative.   Psychiatric/Behavioral: The patient is nervous/anxious.   All other systems reviewed and are negative.   Blood pressure 108/67, pulse 102, temperature 97.6 F (36.4 C), temperature source Oral, resp. rate 23, height 6' (1.829 m), weight 43.4 kg (95 lb 10.9 oz), SpO2 100.00%. Physical Exam  Nursing note and vitals reviewed. Constitutional: He is oriented to person, place, and time.       Cachectic and chronically ill appearing young man. Appears uncomfortable.  HENT:  Head: Normocephalic and atraumatic.  Mouth/Throat: No oropharyngeal exudate.       Oral Candidal rash  Eyes: Conjunctivae are normal. Pupils are equal, round, and reactive to light. Right eye exhibits no discharge. Left eye exhibits no discharge. No scleral icterus.  Neck: Normal range of motion. No JVD present. No tracheal deviation present. No thyromegaly present.  Cardiovascular: Regular rhythm.  Exam reveals no gallop and no friction rub.   Murmur heard.      Regular tachycardia  Respiratory: Effort normal and breath sounds normal. No stridor. No respiratory distress. He has no wheezes. He has no rales. He exhibits no tenderness.  GI: Soft. Bowel sounds are normal. He exhibits no mass. There is tenderness. There is guarding. There is no rebound.       Epigastric and LUQ tenderness  Musculoskeletal: Normal range of motion. He exhibits no edema and no tenderness.  Lymphadenopathy:    He has no cervical adenopathy.  Neurological: He is alert and oriented to person, place, and time. No cranial nerve deficit. Coordination  normal.  Skin: Skin is warm and dry. No rash noted. No erythema. There is pallor.  Psychiatric: He has a normal mood and affect. Thought content normal.    Assessment/Plan: 1. Acute renal failure with hyperkalemia: Suspect that this is by factorial from volume depletion/prerenal azotemia in the face of ongoing Bactrim use (impaired creatinine secretion/potassium retention) and nonsteroidal inflammatory drug use (impaired renal perfusion/potassium retention). Agree with intravenous fluid resuscitation and efforts at potassium mobilization through Kaliuresis and Kayexalate administration. We'll recheck labs frequently to determine whether he needs to start dialysis as a modality to help reduce his potassium. At this time, he is making urine and we need to engage in efforts to maximize that as a means for potassium dumping. Agree with observation in telemetry given EKG changes from hyperkalemia and we'll await labs from this morning status post D50/insulin which was a temporizing measure given earlier in the night. Check renal ultrasound and urine electrolytes to further corroborate our clinical suspicion. Urine protein to creatinine ratio we also checked recognizing the limitations of acute renal failure. 2. Hyponatremia: Based on labs from 2 weeks ago, appears to be acute and likely from acute renal failure/impaired water handling. We don't need to be overly cautious about raising this level of sodium as he will tolerate it well. 3. Pneumocystis pneumonia: Would confer with infectious disease to see if he may be switched from Bactrim to an alternative agent such as dapsone Or Atovaquone 4. HIV/AIDS: Anti-retroviral therapy per infectious disease specialists. May need modification with acute renal failure. 5. Anion gap metabolic acidosis (when corrected for low albumin): Likely secondary to acute renal failure, administer intravenous fluids with bicarbonate supplementation to avoid respiratory  decline.  Callan Yontz K. 05/31/2011,  6:49 AM

## 2011-05-31 NOTE — Progress Notes (Signed)
Report from Night RN. Chart reviewed together. Handoff complete.  

## 2011-05-31 NOTE — ED Notes (Signed)
Called [facility] and awaiting call for report.

## 2011-05-31 NOTE — Progress Notes (Signed)
MD advised of most recent potassium level. In light of newest number, hold on kayexalate for now and review with next bmet level.Will continue to monitor and advise attending as needed.

## 2011-05-31 NOTE — Progress Notes (Signed)
FPTS INTERIM NOTE  D/w ID (Dr. Orvan Falconer) about Mr. Schwager acute renal failure and HIV therapy-- will hold HAART (Perzista, Truvada, Norvir) at this time.  ID will see pt later today for further assistance with managing his PCP PNA and HIV.  Of note, per most recent ID clinic note, patient would be finished with his PCP PNA Bactrim treatment tomorrow (06/01/11) and starting PCP ppx after that.    Shareta Fishbaugh 05/31/2011, 11:40 AM

## 2011-05-31 NOTE — H&P (Signed)
I interviewed and examined this patient and discussed the care plan with Dr. Gwenlyn Saran and Armen Pickup and the [facility]PT[facility] team and agree with assessment and plan as documented in the admission note for today.    Cinch Ormond A. Sheffield Slider, MD Family Medicine Teaching Service Attending  05/31/2011 1:45 PM

## 2011-06-01 LAB — BASIC METABOLIC PANEL
BUN: 85 mg/dL — ABNORMAL HIGH (ref 6–23)
Chloride: 92 mEq/L — ABNORMAL LOW (ref 96–112)
Chloride: 90 mEq/L — ABNORMAL LOW (ref 96–112)
Creatinine, Ser: 4.43 mg/dL — ABNORMAL HIGH (ref 0.50–1.35)
GFR calc Af Amer: 19 mL/min — ABNORMAL LOW (ref >90)
GFR calc Af Amer: 19 mL/min — ABNORMAL LOW (ref >90)
GFR calc Af Amer: 18 mL/min — ABNORMAL LOW (ref >90)
GFR calc non Af Amer: 16 mL/min — ABNORMAL LOW (ref >90)
Potassium: 4.7 mEq/L (ref 3.5–5.1)
Potassium: 4.7 mEq/L (ref 3.5–5.1)
Potassium: 4.7 mEq/L (ref 3.5–5.1)
Sodium: 131 mEq/L — ABNORMAL LOW (ref 135–145)
Sodium: 130 mEq/L — ABNORMAL LOW (ref 135–145)

## 2011-06-01 LAB — DIFFERENTIAL
Basophils Absolute: 0 10*3/uL (ref 0.0–0.1)
Eosinophils Absolute: 0 10*3/uL (ref 0.0–0.7)
Eosinophils Absolute: 0 10*3/uL (ref 0.0–0.7)
Lymphocytes Relative: 6 % — ABNORMAL LOW (ref 12–46)
Lymphs Abs: 0.6 10*3/uL — ABNORMAL LOW (ref 0.7–4.0)
Monocytes Relative: 4 % (ref 3–12)
Neutro Abs: 5.2 10*3/uL (ref 1.7–7.7)
Neutrophils Relative %: 90 % — ABNORMAL HIGH (ref 43–77)
Neutrophils Relative %: 87 % — ABNORMAL HIGH (ref 43–77)

## 2011-06-01 LAB — CBC
MCHC: 35 g/dL (ref 30.0–36.0)
Platelets: 61 10*3/uL — ABNORMAL LOW (ref 150–400)
RDW: 20.3 % — ABNORMAL HIGH (ref 11.5–15.5)
WBC: 5.9 10*3/uL (ref 4.0–10.5)

## 2011-06-01 MED ORDER — SODIUM CHLORIDE 0.9 % IV SOLN
INTRAVENOUS | Status: DC
  Administered 2011-06-01 (×2): via INTRAVENOUS

## 2011-06-01 NOTE — Progress Notes (Signed)
Subjective: Patient feeling better today.  States that nausea is improving.  He still develops SOB and palpitations with exertion, which is not normal for him.  He was discharged home with home O2 a few weeks ago.  Currently he is resting in bed with O2 sat 96% on room air.  Denies any chest pain at this time.  Objective: Vital signs in last 24 hours: Temp:  [98 F (36.7 C)-98.6 F (37 C)] 98.6 F (37 C) (05/12 0400) Pulse Rate:  [96-106] 104  (05/12 0600) Resp:  [21-23] 21  (05/11 1300) BP: (93-111)/(58-75) 93/63 mmHg (05/12 0600) SpO2:  [96 %-100 %] 96 % (05/12 0600) Weight:  [102 lb 11.8 oz (46.6 kg)] 102 lb 11.8 oz (46.6 kg) (05/12 0400) Weight change: 7 lb 0.9 oz (3.2 kg) Last BM Date: 05/31/11  Intake/Output from previous day: 05/11 0701 - 05/12 0700 In: 4350 [P.O.:60; I.V.:4290] Out: 1100 [Urine:1100] Intake/Output this shift:   Physical Exam  Constitutional: No distress. Cachectic. Neck: Normal range of motion. Neck supple.  Cardiovascular: Regular rhythm and normal heart sounds. No murmur heard. Tachycardic to the 100's.  Respiratory: Effort normal and breath sounds normal. He has no wheezes. He exhibits no tenderness.  GI: Soft. Bowel sounds are normal. He exhibits no distension. There is no tenderness.  Musculoskeletal: Normal range of motion. He exhibits no edema and no tenderness.  Neurological: He is alert and oriented to person, place, and time. CN2-12 grossly intact. Skin: Skin is dry. No rash noted.   Lab Results:  Basename 06/01/11 0542 05/30/11 2319  WBC 5.9 7.4  HGB 7.6* 10.8*  HCT 21.7* 30.9*  PLT 61* 56*   BMET  Basename 06/01/11 0542 05/31/11 2222  NA 130* 129*  K 4.7 4.7  CL 92* 94*  CO2 22 20  GLUCOSE 137* 137*  BUN 87* 88*  CREATININE 4.35* 4.13*  CALCIUM 6.5* 6.5*    Studies/Results: Dg Chest 2 View  05/30/2011  *RADIOLOGY REPORT*  Clinical Data: Shortness of breath.  CHEST - 2 VIEW  Comparison: Plain film of the chest 05/02/2011 and  05/12/2011.  CT chest 04/27/2011.  Findings: Hazy bilateral airspace disease seen on the most recent exam appears markedly improved.  No consolidative process, pneumothorax or effusion is identified.  Heart size is normal.  IMPRESSION: Near complete resolution of hazy bilateral airspace opacities.  No new abnormality.  Original Report Authenticated By: Bernadene Bell. Maricela Curet, M.D.   US Renal  05/31/2011  *RADIOLOGY REPORT*  Clinical Data: Chronic renal disease.  Elevated BUN and creatinine.  RENAL/URINARY TRACT ULTRASOUND COMPLETE  Comparison:  None.  Findings:  Right Kidney:  Measures 10.8 cm.  No stone, mass or hydronephrosis. Increased cortical echogenicity is present.  Left Kidney:  Measures 10.0 cm.  No stone, mass or hydronephrosis. Increased cortical echogenicity is present.  Bladder:  Nearly completely decompressed.  IMPRESSION: Negative hydronephrosis.  Increased echogenicity is consistent with medical renal disease.  Original Report Authenticated By: Bernadene Bell. Maricela Curet, M.D.    Medications:  Scheduled:   . azithromycin  1,200 mg Oral Q7 days  . dapsone  100 mg Oral Daily  . darunavir  800 mg Oral Q breakfast  . feeding supplement  237 mL Oral BID BM  . ferrous sulfate  325 mg Oral TID WC  . fluconazole  100 mg Oral Weekly  . lamiVUDine  150 mg Oral Daily  . megestrol  400 mg Oral Daily  . mirtazapine  15 mg Oral QHS  . mulitivitamin with  minerals  1 tablet Oral Daily  . ritonavir  100 mg Oral Q breakfast  . sodium polystyrene  60 g Oral Once  . zidovudine  300 mg Oral Q12H  . DISCONTD: Darunavir Ethanolate  800 mg Oral Daily  . DISCONTD: emtricitabine-tenofovir  1 tablet Oral Daily  . DISCONTD: ritonavir  100 mg Oral Daily   ZOX:[id number], acetaminophen, albuterol, promethazine, sodium polystyrene  Assessment/Plan: 36 yo male with h/o newly diagnosed HIV/AIDS (CD4 count of 20 on 05/22/11) who presents with worsening shortness of breath, weakness and intermittent chest pain  and who was admitted for hyperkalemia, acute renal failure and metabolic acidosis.   # Acute renal failure: likely secondary to combination of poor oral intake and low volume status as well as recent use of bactrim and naproxen.  Renal US negative for obstruction. - Decrease IVF per renal recommendations to prevent alkalosis.  Appreciate Renal consult. - Discontinued Bactrim for PCP prophylaxis.  Appreciate ID consult.  # Hyperkalemia: Resolved, s/p calcium gluconate, D50, insulin, sodium bicarb in the ED.  -  Continue BMP daily - continue tele monitoring in the setting of EKG changes  - No need for HD at this time per Renal  # Metabolic Acidosis: likely secondary to renal failure  - start isotonic bicarb per Dr. Eliane Decree recommendations.   # Hyponatremia: likely secondary to low volume and renal failure.  - continue IV fluids   # Chest pain and shortness of breath: No complaints of CP at this time.  CXR appears improved from previous and doesn't show any evidence of new pneumonia.  Cardiac enzymes negative x 2. no ST elevation on EKG. Pulmonary Embolus is part of the differential but his Wells criteria shows low probability and he doesn't have any evidence of DVT on exam. D-dimer in his case would be elevated secondary to renal failure and a CTA would not be possible given renal function. VQ scan would perhaps not be accurate in the setting of recent pneumonia. Will continue monitoring and consider further workup if this persists and clinical picture changes.  - Maintain O2 sat > 92% - tele monitoring on step down   # Thrombocytopenia: decrease since early May. No petechia or bruising on exam.  - follow up peripheral smear for any evidence of schistocytes.   # HIV/AIDS: CD4 count of 20 on 05/22/11 and viral load 837K on 05/03/11  - continue home antiviral therapy  - continue azithromycin  - follow up CD4 count and viral load  - Follow up ID recommendations  # Malnutrition: low albumin  and cachectic on exam, secondary to AIDS wasting  - continue megace and mirtazapine  - continue ensure supplements   # Fen/Gi:  - Isotonic bicarb at 200cc/hr  - Regular diet  # Prophylaxis:  -SCD's given low platelet count  # Dispo: admitted to step down unit. Pending improvement of electrolytes and acidosis.    LOS: 2 days   DE LA CRUZ,Mathayus Stanbery 06/01/2011, 9:53 AM

## 2011-06-01 NOTE — Progress Notes (Signed)
Patient ID: Torey Reinard, male   DOB: 03/30/1975, 36 y.o.   MRN: [id number]   [facility] Progress Note    Subjective:   Reports to be feeling better today and reports a good appetite and improved energy.   Objective:   BP 93/63  Pulse 104  Temp(Src) 98.6 F (37 C) (Oral)  Resp 21  Ht 6' (1.829 m)  Wt 46.6 kg (102 lb 11.8 oz)  BMI 13.93 kg/m2  SpO2 96%  Intake/Output Summary (Last 24 hours) at 06/01/11 0931 Last data filed at 06/01/11 0600  Gross per 24 hour  Intake   4260 ml  Output   1100 ml  Net   3160 ml   Weight change: 3.2 kg (7 lb 0.9 oz)  Physical Exam: JXB:[id number] up in bed- eating b/fast YQM:VHQIO Regular tachycardia, s1 and s2 with ESM Resp:CTA bilaterally, no rales/rhonchi NGE:XBMW, flat, NT, BS normal Ext:No LE edema  Imaging: Dg Chest 2 View  05/30/2011  *RADIOLOGY REPORT*  Clinical Data: Shortness of breath.  CHEST - 2 VIEW  Comparison: Plain film of the chest 05/02/2011 and 05/12/2011.  CT chest 04/27/2011.  Findings: Hazy bilateral airspace disease seen on the most recent exam appears markedly improved.  No consolidative process, pneumothorax or effusion is identified.  Heart size is normal.  IMPRESSION: Near complete resolution of hazy bilateral airspace opacities.  No new abnormality.  Original Report Authenticated By: Bernadene Bell. Maricela Curet, M.D.   US Renal  05/31/2011  *RADIOLOGY REPORT*  Clinical Data: Chronic renal disease.  Elevated BUN and creatinine.  RENAL/URINARY TRACT ULTRASOUND COMPLETE  Comparison:  None.  Findings:  Right Kidney:  Measures 10.8 cm.  No stone, mass or hydronephrosis. Increased cortical echogenicity is present.  Left Kidney:  Measures 10.0 cm.  No stone, mass or hydronephrosis. Increased cortical echogenicity is present.  Bladder:  Nearly completely decompressed.  IMPRESSION: Negative hydronephrosis.  Increased echogenicity is consistent with medical renal disease.  Original Report Authenticated By: Bernadene Bell.  D'ALESSIO, M.D.    Labs: BMET  Lab 06/01/11 0542 05/31/11 2222 05/31/11 1351 05/31/11 0850 05/31/11 0620 05/30/11 2319  NA 130* 129* 128* 127* 125* 123*  K 4.7 4.7 5.7* 6.7* 7.1* 7.5*  CL 92* 94* 95* 99 95* 95*  CO2 22 20 18* 13* 13* 13*  GLUCOSE 137* 137* 93 91 81 101*  BUN 87* 88* 92* 92* 94* 91*  CREATININE 4.35* 4.13* 4.22* 4.03* 4.07* 3.93*  ALB -- -- -- -- -- --  CALCIUM 6.5* 6.5* 6.9* 7.1* 7.5* 7.6*  PHOS -- -- -- -- -- --   CBC  Lab 06/01/11 0542 05/30/11 2319  WBC 5.9 7.4  NEUTROABS 5.3 6.2  HGB 7.6* 10.8*  HCT 21.7* 30.9*  MCV 87.9 87.5  PLT 61* 56*    Medications:      . azithromycin  1,200 mg Oral Q7 days  . dapsone  100 mg Oral Daily  . darunavir  800 mg Oral Q breakfast  . feeding supplement  237 mL Oral BID BM  . ferrous sulfate  325 mg Oral TID WC  . fluconazole  100 mg Oral Weekly  . lamiVUDine  150 mg Oral Daily  . megestrol  400 mg Oral Daily  . mirtazapine  15 mg Oral QHS  . mulitivitamin with minerals  1 tablet Oral Daily  . ritonavir  100 mg Oral Q breakfast  . sodium polystyrene  60 g Oral Once  . zidovudine  300 mg Oral Q12H  . DISCONTD:  Darunavir Ethanolate  800 mg Oral Daily  . DISCONTD: emtricitabine-tenofovir  1 tablet Oral Daily  . DISCONTD: ritonavir  100 mg Oral Daily     Assessment/ Plan:   1. Acute renal failure with hyperkalemia: Suspect that this is bifactorial from volume depletion/prerenal azotemia in the face of ongoing Bactrim use (impaired creatinine secretion/potassium retention) and nonsteroidal inflammatory drug use (impaired renal perfusion/potassium retention) with likely evolution to ATN. Renal ultrasound negative for obstruction and hyperkalemia treated medically. He is currently non-oliguric and without acute HD needs. Will decrease rate of IVFs to avoid inducing alkalosis. Appreciate help from ID in adjusting ARVs.  2. Hyponatremia: Improving with IVfs, monitor closely.  3. Pneumocystis pneumonia: TMP/SMX stopped  and now on dapsone.  4. HIV/AIDS: Anti-retroviral therapy adjustments per infectious disease.    Zetta Bills, MD 06/01/2011, 9:31 AM

## 2011-06-01 NOTE — Progress Notes (Signed)
I interviewed and examined this patient and discussed the care plan with Dr. Tye Savoy and the FPTS team and agree with assessment and plan as documented in the progress note for today.    Mialynn Shelvin A. Sheffield Slider, MD Family Medicine Teaching Service Attending  06/01/2011 3:29 PM

## 2011-06-01 NOTE — Progress Notes (Signed)
Patient ID: Daniel House, male   DOB: Sep 10, 1975, 36 y.o.   MRN: [id number]  INFECTIOUS DISEASE PROGRESS NOTE    Date of Admission:  05/30/2011     Active Problems:  Acute renal failure  HIV disease  Back pain     . azithromycin  1,200 mg Oral Q7 days  . dapsone  100 mg Oral Daily  . darunavir  800 mg Oral Q breakfast  . feeding supplement  237 mL Oral BID BM  . ferrous sulfate  325 mg Oral TID WC  . fluconazole  100 mg Oral Weekly  . lamiVUDine  150 mg Oral Daily  . megestrol  400 mg Oral Daily  . mirtazapine  15 mg Oral QHS  . mulitivitamin with minerals  1 tablet Oral Daily  . ritonavir  100 mg Oral Q breakfast  . sodium polystyrene  60 g Oral Once  . zidovudine  300 mg Oral Q12H   Subjective: He is feeling a little bit better today  Objective: Temp:  [98 F (36.7 C)-98.6 F (37 C)] 98.6 F (37 C) (05/12 0400) Pulse Rate:  [96-106] 104  (05/12 0600) BP: (93-108)/(58-70) 93/63 mmHg (05/12 0600) SpO2:  [96 %-100 %] 96 % (05/12 0600) Weight:  [46.6 kg (102 lb 11.8 oz)] 46.6 kg (102 lb 11.8 oz) (05/12 0400)  General: Alert and comfortable  Lab Results Lab Results  Component Value Date   WBC 5.9 06/01/2011   HGB 7.6* 06/01/2011   HCT 21.7* 06/01/2011   MCV 87.9 06/01/2011   PLT 61* 06/01/2011    Lab Results  Component Value Date   CREATININE 4.35* 06/01/2011   BUN 85* 06/01/2011   NA 131* 06/01/2011   K 4.7 06/01/2011   CL 90* 06/01/2011   CO2 27 06/01/2011    Assessment: There is no change in his acute renal insufficiency but otherwise he is doing a little bit better. He seems to be tolerating his new HIV medications.  Plan: 1. Continue current antiretroviral and HIV prophylactic medications   Cliffton Asters, MD [facility] [phone] pager   [phone] cell 06/01/2011, 2:49 PM

## 2011-06-02 LAB — T-HELPER CELLS (CD4) COUNT (NOT AT ARMC)
CD4 % Helper T Cell: 8 % — ABNORMAL LOW (ref 33–55)
CD4 T Cell Abs: 50 uL — ABNORMAL LOW (ref 400–2700)

## 2011-06-02 LAB — BASIC METABOLIC PANEL
CO2: 21 mEq/L (ref 19–32)
GFR calc Af Amer: 18 mL/min — ABNORMAL LOW (ref >90)
GFR calc non Af Amer: 15 mL/min — ABNORMAL LOW (ref >90)
Glucose, Bld: 97 mg/dL (ref 70–99)
Glucose, Bld: 103 mg/dL — ABNORMAL HIGH (ref 70–99)
Potassium: 4.3 mEq/L (ref 3.5–5.1)
Potassium: 4.6 mEq/L (ref 3.5–5.1)
Sodium: 128 mEq/L — ABNORMAL LOW (ref 135–145)
Sodium: 129 mEq/L — ABNORMAL LOW (ref 135–145)

## 2011-06-02 LAB — PATHOLOGIST SMEAR REVIEW

## 2011-06-02 LAB — CBC
Hemoglobin: 7.6 g/dL — ABNORMAL LOW (ref 13.0–17.0)
MCH: 30.9 pg (ref 26.0–34.0)
RBC: 2.46 MIL/uL — ABNORMAL LOW (ref 4.22–5.81)

## 2011-06-02 MED ORDER — DARBEPOETIN ALFA-POLYSORBATE 100 MCG/0.5ML IJ SOLN
100.0000 ug | INTRAMUSCULAR | Status: DC
  Administered 2011-06-02 – 2011-06-09 (×2): 100 ug via SUBCUTANEOUS
  Filled 2011-06-02 (×3): qty 0.5

## 2011-06-02 MED ORDER — TRAMADOL HCL 50 MG PO TABS
50.0000 mg | ORAL_TABLET | Freq: Four times a day (QID) | ORAL | Status: DC
  Filled 2011-06-02 (×2): qty 1

## 2011-06-02 MED ORDER — TRAMADOL HCL 50 MG PO TABS
50.0000 mg | ORAL_TABLET | Freq: Four times a day (QID) | ORAL | Status: DC | PRN
  Administered 2011-06-03 – 2011-06-06 (×6): 50 mg via ORAL
  Filled 2011-06-02 (×9): qty 1

## 2011-06-02 NOTE — Progress Notes (Signed)
INFECTIOUS DISEASE PROGRESS NOTE  ID: Daniel House is a 36 y.o. male with ARF, newly diagnosed HIV with recent PCP pneumonia  Subjective: Some GI upset but overall feels better  Abtx:  Anti-infectives     Start     Dose/Rate Route Frequency Ordered Stop   06/01/11 0800   Darunavir Ethanolate (PREZISTA) tablet 800 mg        800 mg Oral Daily with breakfast 05/31/11 1500     06/01/11 0800   ritonavir (NORVIR) tablet 100 mg        100 mg Oral Daily with breakfast 05/31/11 1500     05/31/11 1600   dapsone tablet 100 mg        100 mg Oral Daily 05/31/11 1500     05/31/11 1600   fluconazole (DIFLUCAN) tablet 100 mg        100 mg Oral Weekly 05/31/11 1500     05/31/11 1600   zidovudine (RETROVIR) capsule 300 mg        300 mg Oral Every 12 hours 05/31/11 1500     05/31/11 1600   lamiVUDine (EPIVIR) tablet 150 mg        150 mg Oral Daily 05/31/11 1500     05/31/11 1000   azithromycin (ZITHROMAX) tablet 1,200 mg        1,200 mg Oral Every 7 days 05/31/11 0640     05/31/11 1000   Darunavir Ethanolate (PREZISTA) tablet 800 mg  Status:  Discontinued        800 mg Oral Daily 05/31/11 0640 05/31/11 1137   05/31/11 1000   emtricitabine-tenofovir (TRUVADA) 200-300 MG per tablet 1 tablet  Status:  Discontinued        1 tablet Oral Daily 05/31/11 0640 05/31/11 1137   05/31/11 1000   ritonavir (NORVIR) tablet 100 mg  Status:  Discontinued        100 mg Oral Daily 05/31/11 0640 05/31/11 1137          Medications: I have reviewed the patient's current medications.  Objective: Vital signs in last 24 hours: Temp:  [98.4 F (36.9 C)-98.8 F (37.1 C)] 98.5 F (36.9 C) (05/13 0800) Pulse Rate:  [65-107] 65  (05/13 1100) BP: (99-120)/(59-86) 120/79 mmHg (05/13 1000) SpO2:  [58 %-100 %] 58 % (05/13 1100)   General appearance: alert, cooperative and no distress Resp: clear to auscultation bilaterally Cardio: regular rate and rhythm, S1, S2 normal, no murmur, click, rub or gallop GI:  soft, non-tender; bowel sounds normal; no masses,  no organomegaly  Lab Results  Basename 06/02/11 0925 06/01/11 1838 06/01/11 0542  WBC 5.8 -- 5.9  HGB 7.6* -- 7.6*  HCT 22.1* -- 21.7*  NA 128* 130* --  K 4.3 4.7 --  CL 93* 90* --  CO2 25 29 --  BUN 82* 84* --  CREATININE 4.57* 4.43* --  GLU -- -- --   Liver Panel  Basename 05/30/11 2319  PROT 6.8  ALBUMIN 2.4*  AST 14  ALT 21  ALKPHOS 124*  BILITOT 0.4  BILIDIR --  IBILI --   Sedimentation Rate No results found for this basename: ESRSEDRATE in the last 72 hours C-Reactive Protein No results found for this basename: CRP:2 in the last 72 hours  Microbiology: Recent Results (from the past 240 hour(s))  MRSA PCR SCREENING     Status: Normal   Collection Time   05/31/11  5:09 AM      Component Value Range Status Comment  MRSA by PCR NEGATIVE  NEGATIVE  Final     Studies/Results: US Renal  05/31/2011  *RADIOLOGY REPORT*  Clinical Data: Chronic renal disease.  Elevated BUN and creatinine.  RENAL/URINARY TRACT ULTRASOUND COMPLETE  Comparison:  None.  Findings:  Right Kidney:  Measures 10.8 cm.  No stone, mass or hydronephrosis. Increased cortical echogenicity is present.  Left Kidney:  Measures 10.0 cm.  No stone, mass or hydronephrosis. Increased cortical echogenicity is present.  Bladder:  Nearly completely decompressed.  IMPRESSION: Negative hydronephrosis.  Increased echogenicity is consistent with medical renal disease.  Original Report Authenticated By: Bernadene Bell. Maricela Curet, M.D.     Assessment/Plan: HIV - tolerating new regimen.  Continue current.   Denishia Citro Infectious Diseases 06/02/2011, 11:20 AM

## 2011-06-02 NOTE — Progress Notes (Signed)
Family Medicine Daily Progress Note: [id number] Subjective: Feeling a little better. Still feeling weak, with ambulation.  Nausea is still present but improved. Has been able to eat some. No vomiting. Still complaining of abdominal discomfort.  No chest pain. Some shortness of breath but improved. No longer requiring O2 (was on 3L at home) Objective: Vital signs in last 24 hours: Temp:  [98.4 F (36.9 C)-98.8 F (37.1 C)] 98.4 F (36.9 C) (05/13 0311) Pulse Rate:  [95-107] 101  (05/13 0500) BP: (86-117)/(54-75) 99/59 mmHg (05/13 0500) SpO2:  [91 %-98 %] 95 % (05/13 0500) Weight change:  Last BM Date: 05/31/11  Intake/Output from previous day: 05/12 0701 - 05/13 0700 In: 3863.3 [P.O.:770; I.V.:3093.3] Out: 1600 [Urine:1600] Intake/Output this shift:   Physical Exam  Constitutional: No distress. Cachectic. Neck: Normal range of motion. Neck supple.  Cardiovascular: Regular rhythm and normal heart sounds. No murmur heard. Tachycardic.  Respiratory: Effort normal and breath sounds normal. He has no wheezes. He exhibits no tenderness.  GI: Soft. Bowel sounds are normal. He exhibits no distension. Discomfort with palpation around umbilicus. Some guarding. No rebound.   Musculoskeletal: Normal range of motion. He exhibits no edema and no tenderness.  Neurological: He is alert and oriented to person, place, and time. CN2-12 grossly intact. Skin: Skin is dry. No rash noted.   Lab Results:  Basename 06/01/11 0542 05/30/11 2319  WBC 5.9 7.4  HGB 7.6* 10.8*  HCT 21.7* 30.9*  PLT 61* 56*   BMET  Basename 06/01/11 1838 06/01/11 0941  NA 130* 131*  K 4.7 4.7  CL 90* 90*  CO2 29 27  GLUCOSE 134* 110*  BUN 84* 85*  CREATININE 4.43* 4.35*  CALCIUM 6.4* 6.4*    Studies/Results: US Renal  05/31/2011  *RADIOLOGY REPORT*  Clinical Data: Chronic renal disease.  Elevated BUN and creatinine.  RENAL/URINARY TRACT ULTRASOUND COMPLETE  Comparison:  None.  Findings:  Right Kidney:  Measures  10.8 cm.  No stone, mass or hydronephrosis. Increased cortical echogenicity is present.  Left Kidney:  Measures 10.0 cm.  No stone, mass or hydronephrosis. Increased cortical echogenicity is present.  Bladder:  Nearly completely decompressed.  IMPRESSION: Negative hydronephrosis.  Increased echogenicity is consistent with medical renal disease.  Original Report Authenticated By: Bernadene Bell. Maricela Curet, M.D.    Medications:  Scheduled:    . azithromycin  1,200 mg Oral Q7 days  . dapsone  100 mg Oral Daily  . darunavir  800 mg Oral Q breakfast  . feeding supplement  237 mL Oral BID BM  . ferrous sulfate  325 mg Oral TID WC  . fluconazole  100 mg Oral Weekly  . lamiVUDine  150 mg Oral Daily  . megestrol  400 mg Oral Daily  . mirtazapine  15 mg Oral QHS  . mulitivitamin with minerals  1 tablet Oral Daily  . ritonavir  100 mg Oral Q breakfast  . sodium polystyrene  60 g Oral Once  . zidovudine  300 mg Oral Q12H   AVW:[id number], acetaminophen, albuterol, promethazine  Assessment/Plan: 36 yo male with h/o newly diagnosed HIV/AIDS (CD4 count of 20 on 05/22/11) who presents with worsening shortness of breath, weakness and intermittent chest pain and who was admitted for hyperkalemia, acute renal failure and metabolic acidosis.   # Acute renal failure: likely secondary to combination of poor oral intake and low volume status as well as recent use of bactrim, naproxen and truvada.  Renal US negative for obstruction. FeNa of 5 suggests ATN.  Creatinine tending up. Patient is making urine.  - continue NS 156ml/hr. Sodium bicarb was stopped yesterday.  - follow renal's recommendations - check BMP at 6PM today  # Hyperkalemia: Resolved, s/p calcium gluconate, D50, insulin, sodium bicarb in the ED. Did not require kayaxelate.  -  Continue BMP bid - continue tele monitoring in the setting of EKG changes   # Metabolic Acidosis: likely secondary to renal failure. Resolved s/p isotonic bicarb  fluids.  - follow up BMP.   # Anemia: Unclear etiology. Hemoglobin has trended down from 10.8 to 7.6 since admission.  Noticed that zidovudine can induce anemia, but this would most likely not be a 2 day onset phenomenon.  Is patient on any antiretroviral therapy that could induce anemia and thrombocytopenia?   # Hyponatremia: likely secondary to low volume and renal failure. Stable currently.  - continue IV fluids at NS@100cc /hr  # Chest pain and shortness of breath: No complaints of CP at this time.  CXR appears improved from previous and doesn't show any evidence of new pneumonia.  Cardiac enzymes negative x 2. no ST elevation on EKG. Pulmonary Embolus is part of the differential but his Wells criteria shows low probability and he doesn't have any evidence of DVT on exam. D-dimer in his case would be elevated secondary to renal failure and a CTA would not be possible given renal function. VQ scan would perhaps not be accurate in the setting of recent pneumonia.  No chest pain. Resolved. Currently on room air.  - Maintain O2 sat > 92% - tele monitoring on step down   # Thrombocytopenia: decrease since early May. No petechia or bruising on exam. INR 1.27 with mildly elevated PT at 16.2. PTT normal.  - follow up peripheral smear for any evidence of schistocytes.  - follow up ID suggestions as to whether antiretrovirals could be causing this.   # HIV/AIDS: CD4 count of 20 on 05/22/11 and viral load 837K on 05/03/11. Repeat counts have mildly improved: viral load: 3667; CD4 count: 50 - continue current antiviral therapy per ID - continue azithromycin for MAC prophylaxis - Continue dapsone for PCP prophylaxis.   # Malnutrition: low albumin and cachectic on exam, secondary to AIDS wasting  - continue megace and mirtazapine  - continue ensure supplements   # Fen/Gi:  - NS at 100 cc/hr - Regular diet  # Prophylaxis:  -SCD's given low platelet count  # Dispo: admitted to step down unit.  Pending improvement of electrolytes and acidosis.    LOS: 3 days   Hulan Szumski 06/02/2011, 7:11 AM

## 2011-06-02 NOTE — Progress Notes (Signed)
CRITICAL VALUE ALERT  Critical value received:  Calcium 6.2  Date of notification:  06/02/11 Time of notification:  1000  Critical value read back:yes  Nurse who received alert:  AI  MD notified (1st page): Family medicine  Time of first page:  1135  MD notified (2nd page):  Time of second page:  Responding MD: Dr. Fara Boros  Time MD responded:  [phone]

## 2011-06-02 NOTE — Progress Notes (Signed)
FMTS Attending Note I have seen and examined the patient today, and discussed with resident team.  I agree with the plan as outlined by Dr Gwenlyn Saran in her note for today.   Paula Compton, MD

## 2011-06-02 NOTE — Progress Notes (Signed)
Patient ID: Daniel House, male   DOB: Aug 24, 1975, 36 y.o.   MRN: [id number]   [facility] Progress Note    Subjective:   Reports to be feeling better even today.  Good UOP but creatinine not trending down yet.   Labs from this AM pending   Objective:   BP 117/86  Pulse 93  Temp(Src) 98.5 F (36.9 C) (Oral)  Resp 20  Ht 6' (1.829 m)  Wt 46.6 kg (102 lb 11.8 oz)  BMI 13.93 kg/m2  SpO2 97%  Intake/Output Summary (Last 24 hours) at 06/02/11 0944 Last data filed at 06/02/11 0700  Gross per 24 hour  Intake 3573.33 ml  Output   1600 ml  Net 1973.33 ml   Weight change:   Physical Exam: NWG:NFAOZHYQMVH up in bed- eating b/fast QIO:NGEXB Regular tachycardia, s1 and s2 with ESM Resp:CTA bilaterally, no rales/rhonchi MWU:XLKG, flat, NT, BS normal Ext:No LE edema  Imaging: US Renal  05/31/2011  *RADIOLOGY REPORT*  Clinical Data: Chronic renal disease.  Elevated BUN and creatinine.  RENAL/URINARY TRACT ULTRASOUND COMPLETE  Comparison:  None.  Findings:  Right Kidney:  Measures 10.8 cm.  No stone, mass or hydronephrosis. Increased cortical echogenicity is present.  Left Kidney:  Measures 10.0 cm.  No stone, mass or hydronephrosis. Increased cortical echogenicity is present.  Bladder:  Nearly completely decompressed.  IMPRESSION: Negative hydronephrosis.  Increased echogenicity is consistent with medical renal disease.  Original Report Authenticated By: Bernadene Bell. D'ALESSIO, M.D.    Labs: BMET  Lab 06/01/11 1838 06/01/11 4010 06/01/11 0542 05/31/11 2222 05/31/11 1351 05/31/11 0850 05/31/11 0620  NA 130* 131* 130* 129* 128* 127* 125*  K 4.7 4.7 4.7 4.7 5.7* 6.7* 7.1*  CL 90* 90* 92* 94* 95* 99 95*  CO2 29 27 22 20  18* 13* 13*  GLUCOSE 134* 110* 137* 137* 93 91 81  BUN 84* 85* 87* 88* 92* 92* 94*  CREATININE 4.43* 4.35* 4.35* 4.13* 4.22* 4.03* 4.07*  ALB -- -- -- -- -- -- --  CALCIUM 6.4* 6.4* 6.5* 6.5* 6.9* 7.1* 7.5*  PHOS -- -- -- -- -- -- --   CBC  Lab 06/01/11  1838 06/01/11 0542 05/30/11 2319  WBC -- 5.9 7.4  NEUTROABS 5.2 5.3 6.2  HGB -- 7.6* 10.8*  HCT -- 21.7* 30.9*  MCV -- 87.9 87.5  PLT -- 61* 56*    Medications:       . azithromycin  1,200 mg Oral Q7 days  . dapsone  100 mg Oral Daily  . darunavir  800 mg Oral Q breakfast  . feeding supplement  237 mL Oral BID BM  . ferrous sulfate  325 mg Oral TID WC  . fluconazole  100 mg Oral Weekly  . lamiVUDine  150 mg Oral Daily  . megestrol  400 mg Oral Daily  . mirtazapine  15 mg Oral QHS  . mulitivitamin with minerals  1 tablet Oral Daily  . ritonavir  100 mg Oral Q breakfast  . sodium polystyrene  60 g Oral Once  . zidovudine  300 mg Oral Q12H     Assessment/ Plan:   1. Acute renal failure with hyperkalemia: Suspect that this is bifactorial from volume depletion/prerenal azotemia in the face of ongoing Bactrim use (impaired creatinine secretion/potassium retention) and nonsteroidal inflammatory drug use (impaired renal perfusion/potassium retention) with likely evolution to ATN. Renal ultrasound negative for obstruction and hyperkalemia treated medically. He is currently non-oliguric and without acute HD needs. Will continue IVFs to  avoid inducing alkalosis. Appreciate help from ID in adjusting ARVs.  2. Hyponatremia: Improving/stable with IVfs, monitor closely.  3. Pneumocystis pneumonia: TMP/SMX stopped and now on dapsone.  4. HIV/AIDS: Anti-retroviral therapy adjustments per infectious disease.  5. Anemia- add aranesp   Daniel House  06/02/2011, 9:44 AM

## 2011-06-02 NOTE — Consult Note (Signed)
WOC consult Note Reason for Consult: Consult requested for buttocks wound. Pt states he has had this wound at home prior to admission and has recently been diagnosed with HIV, which is consistent with this type of wound near rectal area. Wound type: Full thickness; this IS NOT A PRESSURE ULCER Measurement: 5X4X.2cm Wound bed: 95% red after removing loos e slough, 5% yellow Drainage (amount, consistency, odor)  Mod green-yellow, no odor Periwound: Intact  Dressing procedure/placement/frequency: Aquacel to absorb drainage and provide antimicrobial benefits. Will not plan to follow further unless re-consulted.  [address], RN, MSN, [employer]  [phone]

## 2011-06-02 NOTE — Care Management Note (Signed)
    Page 1 of 1   06/02/2011     12:27:53 PM   CARE MANAGEMENT NOTE 06/02/2011  Patient:  Daniel House, Daniel House   Account Number:  [id number]  Date Initiated:  06/02/2011  Documentation initiated by:  Junius Creamer  Subjective/Objective Assessment:   adm w k 7.5     Action/Plan:   lives w fam, pcp dr Mat Carne, has home o2   Anticipated DC Date:  06/04/2011   Anticipated DC Plan:  HOME W HOME HEALTH SERVICES      DC Planning Services  CM consult      [facility] Choice  Resumption Of Svcs/PTA Provider   Choice offered to / List presented to:          [facility] arranged  HH-1 RN  HH-2 PT  HH-3 OT      [facility] agency  [facility]   Status of service:   Medicare Important Message given?   (If response is "NO", the following Medicare IM given date fields will be blank) Date Medicare IM given:   Date Additional Medicare IM given:    Discharge Disposition:  HOME W HOME HEALTH SERVICES  Per UR Regulation:  Reviewed for med. necessity/level of care/duration of stay  If discussed at Long Length of Stay Meetings, dates discussed:    Comments:  06/02/11 12:30p debbie Trystian Crisanto rn,bsn [phone] alerted marie w [facility] of pt's adm.

## 2011-06-03 DIAGNOSIS — E872 Acidosis, unspecified: Secondary | ICD-10-CM | POA: Diagnosis present

## 2011-06-03 DIAGNOSIS — E871 Hypo-osmolality and hyponatremia: Secondary | ICD-10-CM | POA: Diagnosis present

## 2011-06-03 DIAGNOSIS — E875 Hyperkalemia: Secondary | ICD-10-CM | POA: Diagnosis present

## 2011-06-03 LAB — BASIC METABOLIC PANEL
BUN: 74 mg/dL — ABNORMAL HIGH (ref 6–23)
BUN: 77 mg/dL — ABNORMAL HIGH (ref 6–23)
CO2: 20 mEq/L (ref 19–32)
Calcium: 6.8 mg/dL — ABNORMAL LOW (ref 8.4–10.5)
Chloride: 101 mEq/L (ref 96–112)
GFR calc non Af Amer: 14 mL/min — ABNORMAL LOW (ref >90)
Glucose, Bld: 91 mg/dL (ref 70–99)
Glucose, Bld: 152 mg/dL — ABNORMAL HIGH (ref 70–99)
Potassium: 4.7 mEq/L (ref 3.5–5.1)
Potassium: 4.6 mEq/L (ref 3.5–5.1)

## 2011-06-03 LAB — CBC
HCT: 22.9 % — ABNORMAL LOW (ref 39.0–52.0)
Hemoglobin: 7.7 g/dL — ABNORMAL LOW (ref 13.0–17.0)
MCH: 31 pg (ref 26.0–34.0)
MCHC: 33.6 g/dL (ref 30.0–36.0)

## 2011-06-03 MED ORDER — SODIUM CHLORIDE 0.9 % IV SOLN
INTRAVENOUS | Status: DC
  Administered 2011-06-04: 100 mL/h via INTRAVENOUS
  Administered 2011-06-04 – 2011-06-05 (×2): via INTRAVENOUS

## 2011-06-03 MED ORDER — CALCIUM CARBONATE ANTACID 500 MG PO CHEW: 1.0000 | CHEWABLE_TABLET | Freq: Three times a day (TID) | ORAL | Status: DC

## 2011-06-03 MED ORDER — SIMETHICONE 80 MG PO CHEW
80.0000 mg | CHEWABLE_TABLET | Freq: Two times a day (BID) | ORAL | Status: DC | PRN
  Administered 2011-06-03: 80 mg via ORAL
  Filled 2011-06-03: qty 1

## 2011-06-03 MED ORDER — CALCIUM CARBONATE ANTACID 500 MG PO CHEW
1.0000 | CHEWABLE_TABLET | Freq: Three times a day (TID) | ORAL | Status: DC | PRN
  Administered 2011-06-07: 400 mg via ORAL
  Filled 2011-06-03 (×3): qty 2

## 2011-06-03 NOTE — Evaluation (Signed)
Physical Therapy Evaluation Patient Details Name: Daniel House MRN: [id number] DOB: 1975/05/16 Today's Date: 06/03/2011 Time: 0865-7846 PT Time Calculation (min): 26 min  PT Assessment / Plan / Recommendation Clinical Impression  36 yo admitted with dyspnea after recent discharge from hospital due to pna (pt recently diagnosed with HIV/AIDS). Pt with recent fall and unsteady with ambulation due to poor muscular endurance. Pt open to further balance testing and potential use of DME to incr safety on d/c home--especially since he will be alone during days.    PT Assessment  Patient needs continued PT services    Follow Up Recommendations  No PT follow up vs. Home health PT (TBA) ;Supervision - Intermittent    Barriers to Discharge Decreased caregiver support      lEquipment Recommendations  Other (comment) (TBA)    Recommendations for Other Services OT consult   Frequency Min 3X/week    Precautions / Restrictions Precautions Precautions: Fall   Pertinent Vitals/Pain On RA SaO2 95-99%      Mobility  Bed Mobility Bed Mobility: Supine to Sit;Sitting - Scoot to Edge of Bed Supine to Sit: 6: Modified independent (Device/Increase time);HOB elevated Sitting - Scoot to Edge of Bed: 7: Independent Transfers Transfers: Sit to Stand;Stand to Sit Sit to Stand: 5: Supervision;With upper extremity assist;From bed Stand to Sit: 4: Min guard;With upper extremity assist;With armrests;To chair/3-in-1 Details for Transfer Assistance: Increased assist for stand to sit due to LE fatigue and legs trembling after ambulation (for safety) Ambulation/Gait Ambulation/Gait Assistance: 4: Min guard Ambulation Distance (Feet): 100 Feet Assistive device: None Ambulation/Gait Assistance Details: On RA, pt able to ambulate and speak in short sentences with SaO2 95%; as fatigued, he widened his stance and legs became notably shakey. Gait Pattern: Wide base of support Gait velocity: decreased    Exercises      PT Diagnosis: Difficulty walking  PT Problem List: Decreased activity tolerance;Decreased balance;Decreased mobility;Decreased knowledge of use of DME;Decreased strength PT Treatment Interventions: DME instruction;Gait training;Stair training;Functional mobility training;Therapeutic activities;Therapeutic exercise;Balance training;Patient/family education   PT Goals Acute Rehab PT Goals PT Goal Formulation: With patient Time For Goal Achievement: 06/10/11 Potential to Achieve Goals: Good Pt will go Sit to Stand: with modified independence;with upper extremity assist PT Goal: Sit to Stand - Progress: Goal set today Pt will go Stand to Sit: with modified independence;with upper extremity assist PT Goal: Stand to Sit - Progress: Goal set today Pt will Ambulate: 51 - 150 feet;with modified independence;with least restrictive assistive device;with gait velocity >(comment) ft/second (>1.8 ft/sec to indicate lesser fall risk) PT Goal: Ambulate - Progress: Goal set today Pt will Go Up / Down Stairs: Flight;with supervision;with rail(s) PT Goal: Up/Down Stairs - Progress: Goal set today  Visit Information  Last PT Received On: 06/03/11 Assistance Needed: +1    Subjective Data  Subjective: Reports he fell one time since last admission--turning around in the kitchen and went over. Denied dizziness or knees buckling; overbalanced. Patient Stated Goal: Wants to feel stronger/better   Prior Functioning  Home Living Lives With: Family Available Help at Discharge: Available PRN/intermittently (alone days until 3:00 pm) Type of Home: House Home Access: Stairs to enter [employer] of Steps: 1 very small Entrance Stairs-Rails: None Home Layout: Two level Alternate Level Stairs-Number of Steps: 12 Alternate Level Stairs-Rails: Left Bathroom Shower/Tub: Tub/shower unit;Curtain Bathroom Toilet: Standard Bathroom Accessibility: Yes How Accessible: Accessible via walker Home  Adaptive Equipment: None Prior Function Level of Independence: Independent Able to Take Stairs?: Yes (step to) Driving:  No Vocation: Unemployed Communication Communication: No difficulties Dominant Hand: Left    Cognition  Overall Cognitive Status: Appears within functional limits for tasks assessed/performed Arousal/Alertness: Awake/alert Orientation Level: Appears intact for tasks assessed Behavior During Session: [facility] for tasks performed    Extremity/Trunk Assessment Right Lower Extremity Assessment RLE ROM/Strength/Tone: Deficits RLE ROM/Strength/Tone Deficits: One repetition max for knee extension 5/5, however with poor muscular endurance as legs become weak/shakey with ambulation Left Lower Extremity Assessment LLE ROM/Strength/Tone: Deficits LLE ROM/Strength/Tone Deficits: One repetition max for knee extension 5/5, however with poor muscular endurance as legs become weak/shakey with ambulation   Balance    End of Session PT - End of Session Equipment Utilized During Treatment: Gait belt Activity Tolerance: Patient limited by fatigue Patient left: in chair;with call bell/phone within reach Nurse Communication: Mobility status   Daniel House 06/03/2011, 3:34 PM  Pager [phone]

## 2011-06-03 NOTE — Progress Notes (Signed)
Brief Progress Note  I noticed that the patients's creatinine increase from 4.6 to 4.92 today. His urine output has also been sub-optimal.  Therefore, I am re-starting IV fluid - NS @ 100 ml/hr.   E.V. [name], MD

## 2011-06-03 NOTE — Progress Notes (Signed)
Patient ID: Renaud Celli, male   DOB: 1975-02-28, 36 y.o.   MRN: [id number]   [facility] Progress Note    Subjective:   Reports to be feeling better even today.  Good UOP but creatinine not trending down yet.   Only complaint is backside.  Having IV  Troubles but drinking ok   Objective:   BP 125/82  Pulse 98  Temp(Src) 98 F (36.7 C) (Oral)  Resp 16  Ht 6' (1.829 m)  Wt 51.1 kg (112 lb 10.5 oz)  BMI 15.28 kg/m2  SpO2 93%  Intake/Output Summary (Last 24 hours) at 06/03/11 0945 Last data filed at 06/03/11 0800  Gross per 24 hour  Intake   2240 ml  Output    700 ml  Net   1540 ml   Weight change:   Physical Exam: OZH:YQMVHQIONGE up in bed- eating b/fast XBM:WUXLK Regular tachycardia, s1 and s2 with ESM Resp:CTA bilaterally, no rales/rhonchi GMW:NUUV, flat, NT, BS normal Ext:No LE edema  Imaging: No results found.  Labs: BMET  Lab 06/03/11 0800 06/02/11 1753 06/02/11 0925 06/01/11 1838 06/01/11 0941 06/01/11 0542 05/31/11 2222  NA 131* 129* 128* 130* 131* 130* 129*  K 4.7 4.6 4.3 4.7 4.7 4.7 4.7  CL 101 97 93* 90* 90* 92* 94*  CO2 20 21 25 29 27 22 20   GLUCOSE 91 103* 97 134* 110* 137* 137*  BUN 74* 80* 82* 84* 85* 87* 88*  CREATININE 4.60* 4.57* 4.57* 4.43* 4.35* 4.35* 4.13*  ALB -- -- -- -- -- -- --  CALCIUM 6.4* 6.3* 6.2* 6.4* 6.4* 6.5* 6.5*  PHOS -- -- -- -- -- -- --   CBC  Lab 06/03/11 0800 06/02/11 0925 06/01/11 1838 06/01/11 0542 05/30/11 2319  WBC 5.3 5.8 -- 5.9 7.4  NEUTROABS -- -- 5.2 5.3 6.2  HGB 7.7* 7.6* -- 7.6* 10.8*  HCT 22.9* 22.1* -- 21.7* 30.9*  MCV 92.3 89.8 -- 87.9 87.5  PLT 94* 71* -- 61* 56*    Medications:       . azithromycin  1,200 mg Oral Q7 days  . dapsone  100 mg Oral Daily  . darbepoetin (ARANESP) injection - NON-DIALYSIS  100 mcg Subcutaneous Q Mon-1800  . darunavir  800 mg Oral Q breakfast  . feeding supplement  237 mL Oral BID BM  . ferrous sulfate  325 mg Oral TID WC  . fluconazole  100 mg Oral  Weekly  . lamiVUDine  150 mg Oral Daily  . megestrol  400 mg Oral Daily  . mirtazapine  15 mg Oral QHS  . mulitivitamin with minerals  1 tablet Oral Daily  . ritonavir  100 mg Oral Q breakfast  . sodium polystyrene  60 g Oral Once  . zidovudine  300 mg Oral Q12H  . DISCONTD: traMADol  50 mg Oral Q6H     Assessment/ Plan:   1. Acute renal failure with hyperkalemia: Suspect that this is bifactorial from volume depletion/prerenal azotemia in the face of ongoing Bactrim use (impaired creatinine secretion/potassium retention) and nonsteroidal inflammatory drug use (impaired renal perfusion/potassium retention) with likely evolution to ATN. Renal ultrasound negative for obstruction and hyperkalemia treated medically. He is currently non-oliguric and without acute HD needs. However creatinine has not trended down yet.  Will stop IVFs as tank is full. Appreciate help from ID in adjusting ARVs. Baseline creatinine is 1.03 on 5/2 so still anticipate recovery 2. Hyponatremia: Improving/stable with IVfs, monitor closely. Maybe chronic from meds ? 3. Pneumocystis  pneumonia: TMP/SMX stopped and now on dapsone.  4. HIV/AIDS: Anti-retroviral therapy adjustments per infectious disease.  5. Anemia- add aranesp, low but stable   Lew Prout A  06/03/2011, 9:45 AM

## 2011-06-03 NOTE — Clinical Social Work Psychosocial (Signed)
     Clinical Social Work Department BRIEF PSYCHOSOCIAL ASSESSMENT 06/03/2011  Patient:  Daniel House, Daniel House     Account Number:  [id number]     Admit date:  05/30/2011  Clinical Social Worker:  Dennison Bulla  Date/Time:  06/03/2011 04:00 PM  Referred by:  RN  Date Referred:  06/03/2011 Referred for  Other - See comment   Other Referral:   Medicaid questions   Interview type:  Patient Other interview type:    PSYCHOSOCIAL DATA Living Status:  FAMILY Admitted from facility:   Level of care:   Primary support name:  [name] Primary support relationship to patient:  SIBLING Degree of support available:   Adequate    CURRENT CONCERNS Current Concerns  Financial Resources   Other Concerns:    SOCIAL WORK ASSESSMENT / PLAN CSW was walking by patient's room when patient asked to speak with CSW. Patient reported that when he was at hospital previously he had met with a financial counselor and wanted to know about his Medicaid application. Patient gave CSW permission to contact DSS regarding application. CSW called DSS who reported patient's worker is Barrie Lyme [phone]) and stated patient's application is pending since 05/09/11. CSW called and left a message with DSS worker. CSW gave patient DSS worker information as well. CSW is signing off but available if needed.   Assessment/plan status:  No Further Intervention Required Other assessment/ plan:   Information/referral to community resources:   DSS case worker #    PATIENTS/FAMILYS RESPONSE TO PLAN OF CARE: Patient alert and oriented. Patient appreciative of CSW help. Patient reports no further concerns at this time.

## 2011-06-03 NOTE — Progress Notes (Signed)
Daily Progress Note [name]. [name], M.D., M.B.A  Family Medicine PGY-1 Pager [phone]   Subjective:  Mr Daniel House is sleeping in a chair this morning.  1. Shortness of breath - does not occur at rest, only with walking 2. Urine output - urine is clear, no difficulty voiding  The patient was walked across the room this morning without supplemental O2 and became dyspneic.   Objective: Vital signs in last 24 hours: Temp:  [97.8 F (36.6 C)-98.5 F (36.9 C)] 97.8 F (36.6 C) (05/14 0440) Pulse Rate:  [65-107] 102  (05/13 1600) Resp:  [14-18] 14  (05/14 0440) BP: (110-121)/(73-86) 121/77 mmHg (05/14 0440) SpO2:  [91 %-100 %] 93 % (05/14 0440) Weight:  [112 lb 10.5 oz (51.1 kg)] 112 lb 10.5 oz (51.1 kg) (05/14 0445) Weight change:  Last BM Date: 06/03/11  Intake/Output from previous day: 05/13 0701 - 05/14 0700 In: 2440 [P.O.:440; I.V.:2000] Out: 700 [Urine:700] Intake/Output this shift: Total I/O In: 1000 [I.V.:1000] Out: 700 [Urine:700] Physical Exam  Constitutional: middle age AAM; No distress. Cachectic. Neck: Normal range of motion. Neck supple.  Cardiovascular: Regular rhythm and normal heart sounds. No murmur heard. Tachycardic.  Respiratory: Effort normal and breath sounds normal. He has no wheezes. He exhibits no tenderness.  GI: Soft. Bowel sounds are normal. He exhibits no distension. Discomfort with palpation around umbilicus. Some guarding. No rebound.   Musculoskeletal: Normal range of motion. He exhibits no edema and no tenderness.  Neurological: He is alert and oriented to person, place, and time. CN2-12 grossly intact. Gait is wide-based and slow. Skin: 4cm x 5cm sacral ulcer covered by aquacel bandage    Lab Results:  [facility] 06/02/11 0925 06/01/11 0542  WBC 5.8 5.9  HGB 7.6* 7.6*  HCT 22.1* 21.7*  PLT 71* 61*   BMET  Basename 06/02/11 1753 06/02/11 0925  NA 129* 128*  K 4.6 4.3  CL 97 93*  CO2 21 25  GLUCOSE 103* 97  BUN 80* 82*    CREATININE 4.57* 4.57*  CALCIUM 6.3* 6.2*    Studies/Results: No results found.  Medications:  Scheduled:    . azithromycin  1,200 mg Oral Q7 days  . dapsone  100 mg Oral Daily  . darbepoetin (ARANESP) injection - NON-DIALYSIS  100 mcg Subcutaneous Q Mon-1800  . darunavir  800 mg Oral Q breakfast  . feeding supplement  237 mL Oral BID BM  . ferrous sulfate  325 mg Oral TID WC  . fluconazole  100 mg Oral Weekly  . lamiVUDine  150 mg Oral Daily  . megestrol  400 mg Oral Daily  . mirtazapine  15 mg Oral QHS  . mulitivitamin with minerals  1 tablet Oral Daily  . ritonavir  100 mg Oral Q breakfast  . sodium polystyrene  60 g Oral Once  . zidovudine  300 mg Oral Q12H  . DISCONTD: traMADol  50 mg Oral Q6H   AVW:[id number], acetaminophen, albuterol, promethazine, traMADol  Assessment/Plan: 36 yo male with h/o newly diagnosed HIV/AIDS (CD4 count of 20 on 05/22/11) who presents with worsening shortness of breath, weakness and intermittent chest pain and who was admitted for hyperkalemia, acute renal failure and metabolic acidosis.   # Acute renal failure: likely secondary to combination of poor oral intake and low volume status as well as recent use of bactrim, naproxen and truvada.  Renal US negative for obstruction. FeNa of 5 suggests ATN. Creatinine stable at 4.57. Patient is making urine.  - continue NS 120ml/hr.  -  continue to follow renal recommendations  # Hyperkalemia: Resolved, s/p calcium gluconate, D50, insulin, sodium bicarb in the ED. Did not require kayaxelate.  -  Continue BMP bid - continue tele monitoring in the setting of EKG changes   # Metabolic Acidosis: likely secondary to renal failure. Resolved s/p isotonic bicarb fluids.  - follow up BMP.   # Anemia: Unclear etiology. Possibly 2/2 renal disease. Noticed that zidovudine can induce anemia, but this would most likely not be a 2 day onset phenomenon. Hemoglobin has trended down from 10.8 to 7.6 since  admission.  - continue darbepoetin    # Hyponatremia: likely secondary to low volume and renal failure. Stable currently.  - continue IV fluids at NS@100cc /hr  # Chest pain and shortness of breath: No complaints of CP at this time.  CXR appears improved from previous and doesn't show any evidence of new pneumonia.  Cardiac enzymes negative x 2. no ST elevation on EKG. Pulmonary Embolus is part of the differential but his Wells criteria shows low probability and he doesn't have any evidence of DVT on exam. D-dimer in his case would be elevated secondary to renal failure and a CTA would not be possible given renal function. VQ scan would perhaps not be accurate in the setting of recent pneumonia.  No chest pain. Resolved. Currently on room air. This is likely worsened by anemia, so continuing to treat anemia will help.  - Maintain O2 sat > 92% (multiple self resolving desats overnight) - consider d/c tele and moving out of step down - order PT   # Thrombocytopenia: decrease since early May. No petechia or bruising on exam. INR 1.27 with mildly elevated PT at 16.2. PTT normal.  - follow up peripheral smear for any evidence of schistocytes.  - follow up ID suggestions as to whether antiretrovirals could be causing this.   # HIV/AIDS: CD4 count of 20 on 05/22/11 and viral load 837K on 05/03/11. Repeat counts have mildly improved: viral load: 3667; CD4 count: 50 - continue current antiviral therapy per ID - continue azithromycin for MAC prophylaxis - Continue dapsone for PCP prophylaxis.   # Malnutrition: low albumin and cachectic on exam, secondary to AIDS wasting  - continue megace and mirtazapine  - continue ensure supplements   # Sacral Ulcer: seen by wound care who does not believe that it is decubitus but rather a sequelae of the HIV  - continue aquacel dressing and sitting on pillow   # Fen/Gi:  - NS at 100 cc/hr - Regular diet   # Prophylaxis:  -SCD's given low platelet count    # Dispo: consider movement out of the stepdown unit today    LOS: 4 days   Mat Carne 06/03/2011, 6:50 AM

## 2011-06-03 NOTE — Progress Notes (Signed)
INFECTIOUS DISEASE PROGRESS NOTE  ID: Daniel House is a 36 y.o. male with ARF, newly diagnosed HIV with recent PCP pneumonia  Subjective: Out of stepdown  Abtx:  Anti-infectives     Start     Dose/Rate Route Frequency Ordered Stop   06/01/11 0800   Darunavir Ethanolate (PREZISTA) tablet 800 mg        800 mg Oral Daily with breakfast 05/31/11 1500     06/01/11 0800   ritonavir (NORVIR) tablet 100 mg        100 mg Oral Daily with breakfast 05/31/11 1500     05/31/11 1600   dapsone tablet 100 mg        100 mg Oral Daily 05/31/11 1500     05/31/11 1600   fluconazole (DIFLUCAN) tablet 100 mg        100 mg Oral Weekly 05/31/11 1500     05/31/11 1600   zidovudine (RETROVIR) capsule 300 mg        300 mg Oral Every 12 hours 05/31/11 1500     05/31/11 1600   lamiVUDine (EPIVIR) tablet 150 mg        150 mg Oral Daily 05/31/11 1500     05/31/11 1000   azithromycin (ZITHROMAX) tablet 1,200 mg        1,200 mg Oral Every 7 days 05/31/11 0640     05/31/11 1000   Darunavir Ethanolate (PREZISTA) tablet 800 mg  Status:  Discontinued        800 mg Oral Daily 05/31/11 0640 05/31/11 1137   05/31/11 1000   emtricitabine-tenofovir (TRUVADA) 200-300 MG per tablet 1 tablet  Status:  Discontinued        1 tablet Oral Daily 05/31/11 0640 05/31/11 1137   05/31/11 1000   ritonavir (NORVIR) tablet 100 mg  Status:  Discontinued        100 mg Oral Daily 05/31/11 0640 05/31/11 1137          Medications: I have reviewed the patient's current medications.  Objective: Vital signs in last 24 hours: Temp:  [97.2 F (36.2 C)-98.3 F (36.8 C)] 97.2 F (36.2 C) (05/14 1424) Pulse Rate:  [98-106] 99  (05/14 1424) Resp:  [14-24] 24  (05/14 1424) BP: (116-125)/(76-86) 123/84 mmHg (05/14 1424) SpO2:  [93 %-100 %] 95 % (05/14 1505) Weight:  [112 lb 10.5 oz (51.1 kg)] 112 lb 10.5 oz (51.1 kg) (05/14 0445)   Gen - nad  Lab Results  Basename 06/03/11 0800 06/02/11 1753 06/02/11 0925  WBC 5.3 -- 5.8    HGB 7.7* -- 7.6*  HCT 22.9* -- 22.1*  NA 131* 129* --  K 4.7 4.6 --  CL 101 97 --  CO2 20 21 --  BUN 74* 80* --  CREATININE 4.60* 4.57* --  GLU -- -- --   Liver Panel No results found for this basename: PROT:2,ALBUMIN:2,AST:2,ALT:2,ALKPHOS:2,BILITOT:2,BILIDIR:2,IBILI:2 in the last 72 hours Sedimentation Rate No results found for this basename: ESRSEDRATE in the last 72 hours C-Reactive Protein No results found for this basename: CRP:2 in the last 72 hours  Microbiology: Recent Results (from the past 240 hour(s))  MRSA PCR SCREENING     Status: Normal   Collection Time   05/31/11  5:09 AM      Component Value Range Status Comment   MRSA by PCR NEGATIVE  NEGATIVE  Final     Studies/Results: No results found.   Assessment/Plan: HIV - tolerating new regimen.  Continue current, renally adjusted.  Good decrease  in viral load compared to baseline.  CD4 also up to 50 which is a good increase in a short time.  Continue prophylaxis meds.    Daniel House Infectious Diseases 06/03/2011, 5:43 PM

## 2011-06-03 NOTE — Progress Notes (Signed)
CRITICAL VALUE ALERT  Critical value received:  [name]   Date of notification:  06/03/11   Nurse who received alert:  Iantha Fallen  MD notified (1st page): Dr [name]  Time of first page: 1017 Md responded 1018.  Calcium 6.4  No new orders given. Will continue to monitor.   [name], [name]

## 2011-06-04 LAB — BASIC METABOLIC PANEL
BUN: 72 mg/dL — ABNORMAL HIGH (ref 6–23)
CO2: 18 mEq/L — ABNORMAL LOW (ref 19–32)
CO2: 14 mEq/L — ABNORMAL LOW (ref 19–32)
Chloride: 103 mEq/L (ref 96–112)
Chloride: 103 mEq/L (ref 96–112)
Creatinine, Ser: 4.78 mg/dL — ABNORMAL HIGH (ref 0.50–1.35)
Creatinine, Ser: 4.75 mg/dL — ABNORMAL HIGH (ref 0.50–1.35)
Sodium: 131 mEq/L — ABNORMAL LOW (ref 135–145)

## 2011-06-04 LAB — CBC
HCT: 25 % — ABNORMAL LOW (ref 39.0–52.0)
MCV: 98 fL (ref 78.0–100.0)
RBC: 2.55 MIL/uL — ABNORMAL LOW (ref 4.22–5.81)
WBC: 4.4 10*3/uL (ref 4.0–10.5)

## 2011-06-04 MED ORDER — SODIUM BICARBONATE 650 MG PO TABS: 325.0000 mg | ORAL_TABLET | Freq: Once | ORAL | Status: DC

## 2011-06-04 MED ORDER — SODIUM BICARBONATE 4.2 % IV SOLN: 50.0000 meq | Freq: Once | INTRAVENOUS | Status: DC

## 2011-06-04 MED ORDER — SODIUM POLYSTYRENE SULFONATE 15 GM/60ML PO SUSP
30.0000 g | Freq: Once | ORAL | Status: AC
  Administered 2011-06-04: 30 g via ORAL
  Filled 2011-06-04: qty 120

## 2011-06-04 MED ORDER — SODIUM BICARBONATE 4.2 % IV SOLN: 25.0000 meq | Freq: Three times a day (TID) | INTRAVENOUS | Status: DC

## 2011-06-04 MED ORDER — SODIUM BICARBONATE 8.4 % IV SOLN
50.0000 meq | Freq: Once | INTRAVENOUS | Status: AC
  Administered 2011-06-05: 50 meq via INTRAVENOUS
  Filled 2011-06-04: qty 50

## 2011-06-04 NOTE — Progress Notes (Signed)
INFECTIOUS DISEASE PROGRESS NOTE  ID: Daniel House is a 36 y.o. male with ARF, newly diagnosed HIV with recent PCP pneumonia.  Renal failure from recent Bactrim use, NSAIDS.  Creat remains elevated.    Subjective: Urinating ok.  Worried about renal recovery.  Abtx:  Anti-infectives     Start     Dose/Rate Route Frequency Ordered Stop   06/01/11 0800   Darunavir Ethanolate (PREZISTA) tablet 800 mg        800 mg Oral Daily with breakfast 05/31/11 1500     06/01/11 0800   ritonavir (NORVIR) tablet 100 mg        100 mg Oral Daily with breakfast 05/31/11 1500     05/31/11 1600   dapsone tablet 100 mg        100 mg Oral Daily 05/31/11 1500     05/31/11 1600   fluconazole (DIFLUCAN) tablet 100 mg        100 mg Oral Weekly 05/31/11 1500     05/31/11 1600   zidovudine (RETROVIR) capsule 300 mg        300 mg Oral Every 12 hours 05/31/11 1500     05/31/11 1600   lamiVUDine (EPIVIR) tablet 150 mg        150 mg Oral Daily 05/31/11 1500     05/31/11 1000   azithromycin (ZITHROMAX) tablet 1,200 mg        1,200 mg Oral Every 7 days 05/31/11 0640     05/31/11 1000   Darunavir Ethanolate (PREZISTA) tablet 800 mg  Status:  Discontinued        800 mg Oral Daily 05/31/11 0640 05/31/11 1137   05/31/11 1000   emtricitabine-tenofovir (TRUVADA) 200-300 MG per tablet 1 tablet  Status:  Discontinued        1 tablet Oral Daily 05/31/11 0640 05/31/11 1137   05/31/11 1000   ritonavir (NORVIR) tablet 100 mg  Status:  Discontinued        100 mg Oral Daily 05/31/11 0640 05/31/11 1137          Medications: I have reviewed the patient's current medications.  Objective: Vital signs in last 24 hours: Temp:  [97 F (36.1 C)-97.6 F (36.4 C)] 97.6 F (36.4 C) (05/15 0602) Pulse Rate:  [92-101] 92  (05/15 0602) Resp:  [18-24] 18  (05/15 0602) BP: (123-140)/(78-87) 140/87 mmHg (05/15 0602) SpO2:  [94 %-99 %] 94 % (05/15 0602) Weight:  [123 lb 11.2 oz (56.11 kg)] 123 lb 11.2 oz (56.11 kg) (05/15  0602)   Gen - nad, thin. CV - RRR Lungs - CTA  Lab Results  Basename 06/04/11 0930 06/03/11 2319 06/03/11 0800  WBC 4.4 -- 5.3  HGB 7.8* -- 7.7*  HCT 25.0* -- 22.9*  NA 130* 131* --  K 4.7 5.2* --  CL 103 103 --  CO2 14* 18* --  BUN 72* 76* --  CREATININE 4.75* 4.78* --  GLU -- -- --   Liver Panel No results found for this basename: PROT:2,ALBUMIN:2,AST:2,ALT:2,ALKPHOS:2,BILITOT:2,BILIDIR:2,IBILI:2 in the last 72 hours Sedimentation Rate No results found for this basename: ESRSEDRATE in the last 72 hours C-Reactive Protein No results found for this basename: CRP:2 in the last 72 hours  Microbiology: Recent Results (from the past 240 hour(s))  MRSA PCR SCREENING     Status: Normal   Collection Time   05/31/11  5:09 AM      Component Value Range Status Comment   MRSA by PCR NEGATIVE  NEGATIVE  Final  Studies/Results: No results found.   Assessment/Plan: HIV - on renally adjusted regimen.  CD4 up to 50 which is a good increase in a short time.  Continue prophylaxis meds.  No changes at this time with GFR about the same.    Dr. Daiva Eves on tomorrow.   Clarkson Rosselli Infectious Diseases 06/04/2011, 2:12 PM

## 2011-06-04 NOTE — Progress Notes (Signed)
Patient ID: Daniel House, male   DOB: 06/21/1975, 36 y.o.   MRN: [id number]   [facility] Progress Note    Subjective:   IVFs restarted.  UOP most likely just not well recorded with move in rooms.  650 recorded so far today.  Patient seems pretty debilitated in the room    Objective:   BP 140/87  Pulse 92  Temp(Src) 97.6 F (36.4 C) (Oral)  Resp 18  Ht 6' (1.829 m)  Wt 56.11 kg (123 lb 11.2 oz)  BMI 16.78 kg/m2  SpO2 94%  Intake/Output Summary (Last 24 hours) at 06/04/11 1227 Last data filed at 06/04/11 0602  Gross per 24 hour  Intake      0 ml  Output    650 ml  Net   -650 ml   Weight change: 5.01 kg (11 lb 0.7 oz)  Physical Exam: WUJ:[id number] in chair- eating lunch AOZ:HYQMV Regular tachycardia, s1 and s2 with ESM Resp:CTA bilaterally, no rales/rhonchi HQI:ONGE, flat, NT, BS normal Ext:No LE edema  Imaging: No results found.  Labs: BMET  Lab 06/04/11 0930 06/03/11 2319 06/03/11 1831 06/03/11 0800 06/02/11 1753 06/02/11 0925 06/01/11 1838  NA 130* 131* 130* 131* 129* 128* 130*  K 4.7 5.2* 4.6 4.7 4.6 4.3 4.7  CL 103 103 102 101 97 93* 90*  CO2 14* 18* 18* 20 21 25 29   GLUCOSE 126* 107* 152* 91 103* 97 134*  BUN 72* 76* 77* 74* 80* 82* 84*  CREATININE 4.75* 4.78* 4.92* 4.60* 4.57* 4.57* 4.43*  ALB -- -- -- -- -- -- --  CALCIUM 7.2* 7.1* 6.8* 6.4* 6.3* 6.2* 6.4*  PHOS -- -- -- -- -- -- --   CBC  Lab 06/04/11 0930 06/03/11 0800 06/02/11 0925 06/01/11 1838 06/01/11 0542 05/30/11 2319  WBC 4.4 5.3 5.8 -- 5.9 --  NEUTROABS -- -- -- 5.2 5.3 6.2  HGB 7.8* 7.7* 7.6* -- 7.6* --  HCT 25.0* 22.9* 22.1* -- 21.7* --  MCV 98.0 92.3 89.8 -- 87.9 --  PLT 105* 94* 71* -- 61* --    Medications:       . azithromycin  1,200 mg Oral Q7 days  . dapsone  100 mg Oral Daily  . darbepoetin (ARANESP) injection - NON-DIALYSIS  100 mcg Subcutaneous Q Mon-1800  . darunavir  800 mg Oral Q breakfast  . feeding supplement  237 mL Oral BID BM  . ferrous sulfate   325 mg Oral TID WC  . fluconazole  100 mg Oral Weekly  . lamiVUDine  150 mg Oral Daily  . megestrol  400 mg Oral Daily  . mirtazapine  15 mg Oral QHS  . mulitivitamin with minerals  1 tablet Oral Daily  . ritonavir  100 mg Oral Q breakfast  . sodium polystyrene  30 g Oral Once  . zidovudine  300 mg Oral Q12H  . DISCONTD: calcium carbonate  1 tablet Oral TID  . DISCONTD: sodium polystyrene  60 g Oral Once     Assessment/ Plan:   1. Acute renal failure with hyperkalemia: Suspect that this is bifactorial from volume depletion/prerenal azotemia in the face of ongoing Bactrim use (impaired creatinine secretion/potassium retention) and nonsteroidal inflammatory drug use (impaired renal perfusion/potassium retention) with likely evolution to ATN. Renal ultrasound negative for obstruction and hyperkalemia treated medically. He is currently non-oliguric and without acute HD needs. However creatinine has not trended down yet.  IVFs restarted, he is certainly not wet so I am OK with this,  however, recovery could still remain slow.   Baseline creatinine is 1.03 on 5/2 so still anticipate recovery.  Creatinine fairly stable the last 4 days.  2. Hyponatremia: Improving/stable with IVfs, monitor closely. Maybe chronic from meds ? 3. Pneumocystis pneumonia: TMP/SMX stopped and now on dapsone.  4. HIV/AIDS: Anti-retroviral therapy adjustments per infectious disease.  5. Anemia- have added aranesp, low but stable  No new recommendations, unfortunately this could be House long recovery process.    Daniel House  06/04/2011, 12:27 PM

## 2011-06-04 NOTE — Progress Notes (Signed)
Daily Progress Note Daniel House. Daniel House, M.D., M.B.A  Family Medicine PGY-1 Pager [phone]   Subjective:  Overnight Events: Creatinine increased from 4.6 to 4.92 after fluids stopped yesterday; the patient has a history of poor PO intake so IVF restarted at 100 ml/hr and creatinine decreased to 4.78  Shortness of breath - persistent when walking   Objective: Vital signs in last 24 hours: Temp:  [97 F (36.1 C)-97.6 F (36.4 C)] 97.6 F (36.4 C) (05/15 0602) Pulse Rate:  [92-106] 92  (05/15 0602) Resp:  [16-24] 18  (05/15 0602) BP: (116-140)/(76-87) 140/87 mmHg (05/15 0602) SpO2:  [94 %-99 %] 94 % (05/15 0602) Weight:  [123 lb 11.2 oz (56.11 kg)] 123 lb 11.2 oz (56.11 kg) (05/15 0602) Weight change: 11 lb 0.7 oz (5.01 kg) Last BM Date: 06/03/11  Intake/Output from previous day: 05/14 0701 - 05/15 0700 In: 100 [I.V.:100] Out: 650 [Urine:650] Intake/Output this shift:   Physical Exam  Constitutional: middle age AAM; No distress. Cachectic. Neck: Normal range of motion. Neck supple.  Cardiovascular: Regular rhythm and normal heart sounds. No murmur heard. Tachycardic.  Respiratory: Effort normal and breath sounds normal. He has no wheezes. He exhibits no tenderness.  GI: Soft. Bowel sounds are normal. He exhibits no distension. Discomfort with palpation around umbilicus. Some guarding. No rebound.   Musculoskeletal: Normal range of motion. He exhibits no edema and no tenderness.  Neurological: He is alert and oriented to person, place, and time. CN2-12 grossly intact. Gait is wide-based and slow. Skin: 4cm x 5cm sacral ulcer covered by aquacel bandage    Lab Results:  Basename 06/04/11 0930 06/03/11 0800  WBC 4.4 5.3  HGB 7.8* 7.7*  HCT 25.0* 22.9*  PLT 105* 94*   BMET  Basename 06/04/11 0930 06/03/11 2319  NA 130* 131*  K 4.7 5.2*  CL 103 103  CO2 14* 18*  GLUCOSE 126* 107*  BUN 72* 76*  CREATININE 4.75* 4.78*  CALCIUM 7.2* 7.1*    Studies/Results: No  results found.  Medications:  Scheduled:    . azithromycin  1,200 mg Oral Q7 days  . dapsone  100 mg Oral Daily  . darbepoetin (ARANESP) injection - NON-DIALYSIS  100 mcg Subcutaneous Q Mon-1800  . darunavir  800 mg Oral Q breakfast  . feeding supplement  237 mL Oral BID BM  . ferrous sulfate  325 mg Oral TID WC  . fluconazole  100 mg Oral Weekly  . lamiVUDine  150 mg Oral Daily  . megestrol  400 mg Oral Daily  . mirtazapine  15 mg Oral QHS  . mulitivitamin with minerals  1 tablet Oral Daily  . ritonavir  100 mg Oral Q breakfast  . sodium polystyrene  30 g Oral Once  . zidovudine  300 mg Oral Q12H  . DISCONTD: calcium carbonate  1 tablet Oral TID  . DISCONTD: sodium polystyrene  60 g Oral Once   AVW:[id number], acetaminophen, albuterol, calcium carbonate, promethazine, traMADol, DISCONTD: simethicone  Assessment/Plan: 36 yo male with h/o newly diagnosed HIV/AIDS (CD4 count of 20 on 05/22/11) who presents with worsening shortness of breath, weakness and intermittent chest pain and who was admitted for hyperkalemia, acute renal failure and metabolic acidosis.   # Acute renal failure: likely secondary to combination of poor oral intake and low volume status as well as recent use of bactrim, naproxen and truvada.  Renal US negative for obstruction. FeNa of 5 suggests ATN.  - Creatinine increased from 4.6 to 4.92 after fluids stopped  yesterday; the patient has a history of poor PO intake so IVF restarted at 100 ml/hr and creatinine decreased to 4.78 - patient with oliguria (0.5 ml/kg/hr) so continue IVF  - continue to follow recommendation from the renal team; appreciate the help   # Hyperkalemia: Resolved, s/p calcium gluconate, D50, insulin, sodium bicarb in the ED. Did not require kayaxelate.  - has now increased to 5.2, give Kayexalate x 1   # Metabolic Acidosis: likely secondary to renal failure. Resolved s/p isotonic bicarb fluids.  - HAS RETURNED with CO2 @ 14; anion gap is  13, delta-delta is 1; suspected origin ATN; restart bicarb per renal recommendations   # Anemia: Unclear etiology. Possibly 2/2 renal disease. Noticed that zidovudine can induce anemia, but this would most likely not be a 2 day onset phenomenon. Hemoglobin has trended down from 10.8 to 7.6 since admission. Stable hgb.  - continue darbepoetin    # Hyponatremia: likely secondary to low volume and renal failure. Stable currently.  - continue IV fluids at NS@100cc /hr  # Chest pain and shortness of breath: No complaints of CP at this time.  CXR appears improved from previous and doesn't show any evidence of new pneumonia.  Cardiac enzymes negative x 2. no ST elevation on EKG. Pulmonary Embolus is part of the differential but his Wells criteria shows low probability and he doesn't have any evidence of DVT on exam. D-dimer in his case would be elevated secondary to renal failure and a CTA would not be possible given renal function. VQ scan would perhaps not be accurate in the setting of recent pneumonia.  No chest pain. Resolved. Currently on room air. This is likely worsened by anemia, so continuing to treat anemia will help.  - Maintain O2 sat > 92% (multiple self resolving desats overnight)   # Thrombocytopenia: decrease since early May. No petechia or bruising on exam. INR 1.27 with mildly elevated PT at 16.2. PTT normal.  - follow up peripheral smear for any evidence of schistocytes.  - improving - continue to trend  # HIV/AIDS: CD4 count of 20 on 05/22/11 and viral load 837K on 05/03/11. Repeat counts have mildly improved: viral load: 3667; CD4 count: 50 - continue current antiviral therapy per ID - continue azithromycin for MAC prophylaxis - Continue dapsone for PCP prophylaxis.   # Malnutrition: low albumin and cachectic on exam, secondary to AIDS wasting  - continue megace and mirtazapine  - continue ensure supplements   # Sacral Ulcer: seen by wound care who does not believe that it  is decubitus but rather a sequelae of the HIV  - continue aquacel dressing and sitting on pillow   # Fen/Gi:  - NS at 100 cc/hr - Regular diet   # Prophylaxis:  -SCD's given low platelet count   # Dispo: keep inpatient until clinical improvement     LOS: 5 days   Mat Carne 06/04/2011, 11:40 AM

## 2011-06-04 NOTE — Progress Notes (Signed)
PT Cancellation Note   Treatment cancelled today due to patient's polite refusal to participate due to just back in bed.    06/04/2011  Wabash Bing, PT [phone] (pager) 06/04/2011  Thayer Bing, PT [phone] (pager)

## 2011-06-04 NOTE — Progress Notes (Signed)
FMTS Attending Note  Patient seen and examined by me today, discussed with resident team.  I agree with Dr. Yetta Numbers assessment/plan as documented in his note.  Mr. Dissinger reports some back pain and dyspnea with exertion, otherwise reports feeling relatively well.  Renal function appears to be plateauing.  Suspected to be result of Bactrim/ATN.  I wonder if other etiologies, such as HIVAN, might be considered in this patient with low CD4 count, high viral load, and proteinuria.  Paula Compton, MD

## 2011-06-05 ENCOUNTER — Inpatient Hospital Stay (HOSPITAL_COMMUNITY): Payer: Medicaid Other

## 2011-06-05 DIAGNOSIS — B59 Pneumocystosis: Secondary | ICD-10-CM

## 2011-06-05 DIAGNOSIS — B20 Human immunodeficiency virus [HIV] disease: Secondary | ICD-10-CM

## 2011-06-05 LAB — RENAL FUNCTION PANEL
CO2: 18 mEq/L — ABNORMAL LOW (ref 19–32)
Calcium: 8 mg/dL — ABNORMAL LOW (ref 8.4–10.5)
Creatinine, Ser: 5.06 mg/dL — ABNORMAL HIGH (ref 0.50–1.35)
Glucose, Bld: 93 mg/dL (ref 70–99)

## 2011-06-05 LAB — URINALYSIS, ROUTINE W REFLEX MICROSCOPIC
Bilirubin Urine: NEGATIVE
Nitrite: NEGATIVE
Specific Gravity, Urine: 1.009 (ref 1.005–1.030)
Urobilinogen, UA: 1 mg/dL (ref 0.0–1.0)

## 2011-06-05 LAB — URINE MICROSCOPIC-ADD ON

## 2011-06-05 MED ORDER — FUROSEMIDE 10 MG/ML IJ SOLN
40.0000 mg | Freq: Once | INTRAMUSCULAR | Status: AC
  Administered 2011-06-05: 40 mg via INTRAVENOUS
  Filled 2011-06-05: qty 4

## 2011-06-05 MED ORDER — LIDOCAINE 5 % EX PTCH
1.0000 | MEDICATED_PATCH | CUTANEOUS | Status: DC
  Administered 2011-06-05 – 2011-06-12 (×8): 1 via TRANSDERMAL
  Filled 2011-06-05 (×9): qty 1

## 2011-06-05 MED ORDER — SODIUM BICARBONATE 8.4 % IV SOLN
50.0000 meq | Freq: Once | INTRAVENOUS | Status: AC
  Administered 2011-06-05: 50 meq via INTRAVENOUS
  Filled 2011-06-05: qty 50

## 2011-06-05 NOTE — Progress Notes (Signed)
[facility] Progress Note    Subjective:   Reports some shortness of breath and believes that it is due to his efforts at finding a comfortable position with regards to his back pain. He denies any obvious chest pain. Denies any cough or hemoptysis. Reports improvement of his appetite and is eating food ordered from [employer] cafeteria.    Objective:   BP 132/80  Pulse 111  Temp(Src) 97.1 F (36.2 C) (Oral)  Resp 20  Ht 6' (1.829 m)  Wt 56.11 kg (123 lb 11.2 oz)  BMI 16.78 kg/m2  SpO2 94%  Intake/Output Summary (Last 24 hours) at 06/05/11 1243 Last data filed at 06/05/11 0900  Gross per 24 hour  Intake   2610 ml  Output    750 ml  Net   1860 ml   Weight change:   Physical Exam: Gen: Comfortably resting in bed, visibly dyspneic CVS: Pulse regular tachycardia, S1 and S2 with an ejection systolic murmur Resp: Bilaterally clear to auscultation without any obvious rales, retractions or rhonchi Abd: Soft, flat, nontender and bowel sounds are normal Ext: No edema over either lower extremity  Imaging: Dg Chest 2 View  06/05/2011  *RADIOLOGY REPORT*  Clinical Data: Short of breath, weakness , smoking history  CHEST - 2 VIEW  Comparison: Five plain film 05/30/2011  Findings: There is increased bilateral air space disease.  Trace pleural fluid is noted along the oblique fissures.  No pneumothorax.  No osseous abnormality.  IMPRESSION:  Increased bilateral air space disease suggesting edema or pneumonia.  Recommend follow-up radiograph to ensure resolution.  Original Report Authenticated By: Genevive Bi, M.D.    Labs: BMET  Lab 06/05/11 1610 06/04/11 0930 06/03/11 2319 06/03/11 1831 06/03/11 0800 06/02/11 1753 06/02/11 0925  NA 137 130* 131* 130* 131* 129* 128*  K 4.3 4.7 5.2* 4.6 4.7 4.6 4.3  CL 109 103 103 102 101 97 93*  CO2 18* 14* 18* 18* 20 21 25   GLUCOSE 93 126* 107* 152* 91 103* 97  BUN 67* 72* 76* 77* 74* 80* 82*  CREATININE 5.06* 4.75* 4.78* 4.92*  4.60* 4.57* 4.57*  ALB -- -- -- -- -- -- --  CALCIUM 8.0* 7.2* 7.1* 6.8* 6.4* 6.3* 6.2*  PHOS 2.9 -- -- -- -- -- --   CBC  Lab 06/04/11 0930 06/03/11 0800 06/02/11 0925 06/01/11 1838 06/01/11 0542 05/30/11 2319  WBC 4.4 5.3 5.8 -- 5.9 --  NEUTROABS -- -- -- 5.2 5.3 6.2  HGB 7.8* 7.7* 7.6* -- 7.6* --  HCT 25.0* 22.9* 22.1* -- 21.7* --  MCV 98.0 92.3 89.8 -- 87.9 --  PLT 105* 94* 71* -- 61* --    Medications:      . azithromycin  1,200 mg Oral Q7 days  . dapsone  100 mg Oral Daily  . darbepoetin (ARANESP) injection - NON-DIALYSIS  100 mcg Subcutaneous Q Mon-1800  . darunavir  800 mg Oral Q breakfast  . feeding supplement  237 mL Oral BID BM  . ferrous sulfate  325 mg Oral TID WC  . fluconazole  100 mg Oral Weekly  . lamiVUDine  150 mg Oral Daily  . megestrol  400 mg Oral Daily  . mirtazapine  15 mg Oral QHS  . mulitivitamin with minerals  1 tablet Oral Daily  . ritonavir  100 mg Oral Q breakfast  . sodium bicarbonate  50 mEq Intravenous Once  . sodium bicarbonate  50 mEq Intravenous Once  . sodium polystyrene  30 g Oral Once  . zidovudine  300 mg Oral Q12H  . DISCONTD: sodium bicarbonate  25 mEq Intravenous TID PC & HS  . DISCONTD: sodium bicarbonate  50 mEq Intravenous Once  . DISCONTD: sodium bicarbonate  325 mg Oral Once     Assessment/ Plan:   1. Acute renal failure with hyperkalemia: Suspected ATN with what appears to be hemodynamically and is in the face of ongoing Bactrim/nonsteroidal anti-inflammatory drug therapy. Renal function not improved in spite of intravenous fluids and currently appears to be having features of early pulmonary edema with a right sided fissural effusion. Given marginal urine outputs, we'll challenge with intravenous furosemide and monitor. I encouraged liberal oral intake and to avoid volume depletion. I will repeat a urinalysis in order to shorten that we are not missing a separate/independent process causing acute renal failure. No acute  electrolyte concerns are noted at this time and his hyperkalemia is corrected.Baseline creatinine is 1.03 on 5/2 so still anticipate recovery.  2. Hyponatremia: Improving/stable with IVfs, monitor closely. Anticipate further improvement after diuresis. 3. Pneumocystis pneumonia: TMP/SMX stopped (due to concerns with hyperkalemia/acute renal failure) and now on dapsone.  4. HIV/AIDS: Anti-retroviral therapy adjustments per infectious disease.  5. Anemia- have added aranesp, low but stable 6. Metabolic acidosis: Start oral bicarbonate supplementation if serum bicarbonate persistently less than 18.   Zetta Bills, MD 06/05/2011, 12:43 PM

## 2011-06-05 NOTE — Progress Notes (Signed)
Daily Progress Note Si Daniel House. [name], M.D., M.B.A  Family Medicine PGY-1 Pager [phone]   Subjective:  O/N events - pt given 50 mEq of NaHCO3 for met acidosis  This AM Mr. Levee is trying to transition from chair to bed and is profoundly dyspneic and tremulous moving only 2-3 feet. He does not think that his breathing is worse while at rest. He claims to have eaten lots of food for dinner and breakfast. He also states that he still making urine, but is curious about his options if his kidney don't improve or worsen.   In general, I am very concerned about the patient's current state as his functional capacity is remarkably low in the setting of severe malnutrition and worsening renal injury. The most positive outcome is that his CD4 count was 50 on 5/10.   Objective: Vital signs in last 24 hours: Temp:  [96.6 F (35.9 C)-97.8 F (36.6 C)] 97.1 F (36.2 C) (05/16 0615) Pulse Rate:  [95-111] 111  (05/16 0615) Resp:  [18-20] 20  (05/16 0615) BP: (131-140)/(80-87) 132/80 mmHg (05/16 0615) SpO2:  [94 %-97 %] 94 % (05/16 0615) Weight change:  Last BM Date: 06/04/11  Filed Weights   06/01/11 0400 06/03/11 0445 06/04/11 0602  Weight: 102 lb 11.8 oz (46.6 kg) 112 lb 10.5 oz (51.1 kg) 123 lb 11.2 oz (56.11 kg)     Intake/Output from previous day: 05/15 0701 - 05/16 0700 In: 2370 [P.O.:240; I.V.:2130] Out: 500 [Urine:500] Intake/Output this shift: Total I/O In: 120 [P.O.:120] Out: 250 [Urine:250] Physical Exam  Constitutional: middle age AAM; distressed, tremulous, cachectic, sickly appearing Neck: Normal range of motion. Neck supple.  Cardiovascular: Regular rhythm and normal heart sounds. No murmur heard. Tachycardic.  Respiratory: Tachypneic and dyspneic on exam, mild crackles in bases bilaterally. He has no wheezes. He exhibits no tenderness.  GI: Soft. Bowel sounds are normal. He exhibits no distension. Discomfort with palpation around umbilicus. Some guarding. No  rebound.   Musculoskeletal: Normal range of motion. He exhibits no edema and no tenderness.  Neurological: He is alert and oriented to person, place, and time. CN2-12 grossly intact. Gait is wide-based and slow. Skin: 4cm x 5cm sacral ulcer covered by aquacel bandage    Lab Results:  Basename 06/04/11 0930 06/03/11 0800  WBC 4.4 5.3  HGB 7.8* 7.7*  HCT 25.0* 22.9*  PLT 105* 94*   BMET  Basename 06/05/11 0635 06/04/11 0930  NA 137 130*  K 4.3 4.7  CL 109 103  CO2 18* 14*  GLUCOSE 93 126*  BUN 67* 72*  CREATININE 5.06* 4.75*  CALCIUM 8.0* 7.2*    Studies/Results: No results found.  Medications:  Scheduled:    . azithromycin  1,200 mg Oral Q7 days  . dapsone  100 mg Oral Daily  . darbepoetin (ARANESP) injection - NON-DIALYSIS  100 mcg Subcutaneous Q Mon-1800  . darunavir  800 mg Oral Q breakfast  . feeding supplement  237 mL Oral BID BM  . ferrous sulfate  325 mg Oral TID WC  . fluconazole  100 mg Oral Weekly  . lamiVUDine  150 mg Oral Daily  . megestrol  400 mg Oral Daily  . mirtazapine  15 mg Oral QHS  . mulitivitamin with minerals  1 tablet Oral Daily  . ritonavir  100 mg Oral Q breakfast  . sodium bicarbonate  50 mEq Intravenous Once  . sodium bicarbonate  50 mEq Intravenous Once  . sodium polystyrene  30 g Oral Once  .  zidovudine  300 mg Oral Q12H  . DISCONTD: sodium bicarbonate  25 mEq Intravenous TID PC & HS  . DISCONTD: sodium bicarbonate  50 mEq Intravenous Once  . DISCONTD: sodium bicarbonate  325 mg Oral Once  . DISCONTD: sodium polystyrene  60 g Oral Once   ZOX:[id number], acetaminophen, albuterol, calcium carbonate, promethazine, traMADol  Assessment/Plan: 36 yo male with h/o newly diagnosed HIV/AIDS (CD4 count of 20 on 05/22/11) who presents with worsening shortness of breath, weakness and intermittent chest pain and who was admitted for hyperkalemia, acute renal failure and metabolic acidosis.   # Acute renal failure: likely secondary to  combination of poor oral intake and low volume status as well as recent use of bactrim, naproxen and truvada.  Renal US negative for obstruction. FeNa of 5 suggests ATN.  - worsening creatinine to 5.06 from 4.78 (baseline 1.03) and decreased urine output, fluid resuscitation has not been very helpful and may be causing mild pulmonary edema, so I will decrease fluids to 50 mL/hr - Is there any reason to think that this might be HIV associated nephropathy? Should we check for protinuria or consider renal biopsy? I appreciate all the assistance from Dr. Kathrene Bongo and the renal team.    # Hyperkalemia: Resolved, s/p calcium gluconate, D50, insulin, sodium bicarb in the ED.  - stable s/p kayexalate x 1 on 5/13  # Metabolic Acidosis: likely secondary to renal failure. Resolved s/p isotonic bicarb fluids.  - NaHCO3 50 mEq given x 1 overnight, repeat this AM and check BMET in the morning, this is likely to persist if RTA present  # Anemia: Unclear etiology. Possibly 2/2 renal disease. Noticed that zidovudine can induce anemia, but this would most likely not be a 2 day onset phenomenon. Hemoglobin has trended down from 10.8 to 7.6 since admission. Stable hgb.  - continue darbepoetin    # Hyponatremia: likely secondary to low volume and renal failure. Stable currently.  - continue IV fluids at NS@100cc /hr  # Chest pain and shortness of breath: No complaints of CP at this time.  CXR appears improved from previous and doesn't show any evidence of new pneumonia.  Cardiac enzymes negative x 2. no ST elevation on EKG. Pulmonary Embolus is part of the differential but his Wells criteria shows low probability and he doesn't have any evidence of DVT on exam. D-dimer in his case would be elevated secondary to renal failure and a CTA would not be possible given renal function. VQ scan would perhaps not be accurate in the setting of recent pneumonia.  No chest pain. Resolved. Currently on room air. This is likely  worsened by anemia, so continuing to treat anemia will help.  - Maintain O2 sat > 92% (multiple self resolving desats overnight) - concerned for pulm edema this morning, so get CXR and decrease fluids    # Thrombocytopenia: decrease since early May. No petechia or bruising on exam. INR 1.27 with mildly elevated PT at 16.2. PTT normal.  - follow up peripheral smear for any evidence of schistocytes.  - improving - continue to trend  # HIV/AIDS: CD4 count of 20 on 05/22/11 and viral load 837K on 05/03/11. Repeat counts have mildly improved: viral load: 3667; CD4 count: 50 - continue current antiviral therapy per ID - continue azithromycin for MAC prophylaxis - Continue dapsone for PCP prophylaxis.   # Malnutrition: low albumin and cachectic on exam, secondary to AIDS wasting  - continue megace and mirtazapine  - continue ensure supplements   -  re-consult nutrition given declining albumin - 1.7 5/16  # Sacral Ulcer: seen by wound care who does not believe that it is decubitus but rather a sequelae of the HIV  - continue aquacel dressing and sitting on pillow   # Fen/Gi:  - NS at 50 cc/hr - Regular diet   # Prophylaxis:  -SCD's given low platelet count   # Dispo: keep inpatient until clinical improvement     LOS: 6 days   Mat Carne 06/05/2011, 9:19 AM

## 2011-06-05 NOTE — Progress Notes (Signed)
Subjective: No new complaints   Antibiotics:  Anti-infectives     Start     Dose/Rate Route Frequency Ordered Stop   06/01/11 0800   Darunavir Ethanolate (PREZISTA) tablet 800 mg        800 mg Oral Daily with breakfast 05/31/11 1500     06/01/11 0800   ritonavir (NORVIR) tablet 100 mg        100 mg Oral Daily with breakfast 05/31/11 1500     05/31/11 1600   dapsone tablet 100 mg        100 mg Oral Daily 05/31/11 1500     05/31/11 1600   fluconazole (DIFLUCAN) tablet 100 mg        100 mg Oral Weekly 05/31/11 1500     05/31/11 1600   zidovudine (RETROVIR) capsule 300 mg        300 mg Oral Every 12 hours 05/31/11 1500     05/31/11 1600   lamiVUDine (EPIVIR) tablet 150 mg        150 mg Oral Daily 05/31/11 1500     05/31/11 1000   azithromycin (ZITHROMAX) tablet 1,200 mg        1,200 mg Oral Every 7 days 05/31/11 0640     05/31/11 1000   Darunavir Ethanolate (PREZISTA) tablet 800 mg  Status:  Discontinued        800 mg Oral Daily 05/31/11 0640 05/31/11 1137   05/31/11 1000   emtricitabine-tenofovir (TRUVADA) 200-300 MG per tablet 1 tablet  Status:  Discontinued        1 tablet Oral Daily 05/31/11 0640 05/31/11 1137   05/31/11 1000   ritonavir (NORVIR) tablet 100 mg  Status:  Discontinued        100 mg Oral Daily 05/31/11 0640 05/31/11 1137          Medications: Scheduled Meds:   . azithromycin  1,200 mg Oral Q7 days  . dapsone  100 mg Oral Daily  . darbepoetin (ARANESP) injection - NON-DIALYSIS  100 mcg Subcutaneous Q Mon-1800  . darunavir  800 mg Oral Q breakfast  . feeding supplement  237 mL Oral BID BM  . ferrous sulfate  325 mg Oral TID WC  . fluconazole  100 mg Oral Weekly  . furosemide  40 mg Intravenous Once  . lamiVUDine  150 mg Oral Daily  . megestrol  400 mg Oral Daily  . mirtazapine  15 mg Oral QHS  . mulitivitamin with minerals  1 tablet Oral Daily  . ritonavir  100 mg Oral Q breakfast  . sodium bicarbonate  50 mEq Intravenous Once  . sodium  bicarbonate  50 mEq Intravenous Once  . sodium polystyrene  30 g Oral Once  . zidovudine  300 mg Oral Q12H  . DISCONTD: sodium bicarbonate  25 mEq Intravenous TID PC & HS  . DISCONTD: sodium bicarbonate  50 mEq Intravenous Once  . DISCONTD: sodium bicarbonate  325 mg Oral Once   Continuous Infusions:   . sodium chloride 100 mL/hr at 06/05/11 0748   PRN Meds:.acetaminophen, acetaminophen, albuterol, calcium carbonate, promethazine, traMADol   Objective: Weight change:   Intake/Output Summary (Last 24 hours) at 06/05/11 1440 Last data filed at 06/05/11 1300  Gross per 24 hour  Intake   2730 ml  Output    900 ml  Net   1830 ml   Blood pressure 118/70, pulse 115, temperature 98.5 F (36.9 C), temperature source Axillary, resp. rate 20, height 6' (1.829 m), weight 123  lb 11.2 oz (56.11 kg), SpO2 96.00%. Temp:  [96.6 F (35.9 C)-98.5 F (36.9 C)] 98.5 F (36.9 C) (05/16 1400) Pulse Rate:  [95-115] 115  (05/16 1400) Resp:  [18-20] 20  (05/16 1400) BP: (118-140)/(70-87) 118/70 mmHg (05/16 1400) SpO2:  [94 %-97 %] 96 % (05/16 1400)  Physical Exam: General: Alert and awake, oriented x3, not in any acute distress. HEENT: anicteric sclera, pupils reactive to light and accommodation, EOMI CVS regular rate, normal r,  no murmur rubs or gallops Chest: clear to auscultation bilaterally, no wheezing, rales or rhonchi Abdomen: soft nontender, nondistended, normal bowel sounds, Neuro: nonfocal  Lab Results:  Basename 06/04/11 0930 06/03/11 0800  WBC 4.4 5.3  HGB 7.8* 7.7*  HCT 25.0* 22.9*  PLT 105* 94*    BMET  Basename 06/05/11 0635 06/04/11 0930  NA 137 130*  K 4.3 4.7  CL 109 103  CO2 18* 14*  GLUCOSE 93 126*  BUN 67* 72*  CREATININE 5.06* 4.75*  CALCIUM 8.0* 7.2*    Micro Results: Recent Results (from the past 240 hour(s))  MRSA PCR SCREENING     Status: Normal   Collection Time   05/31/11  5:09 AM      Component Value Range Status Comment   MRSA by PCR NEGATIVE   NEGATIVE  Final     Studies/Results: Dg Chest 2 View  06/05/2011  *RADIOLOGY REPORT*  Clinical Data: Short of breath, weakness , smoking history  CHEST - 2 VIEW  Comparison: Five plain film 05/30/2011  Findings: There is increased bilateral air space disease.  Trace pleural fluid is noted along the oblique fissures.  No pneumothorax.  No osseous abnormality.  IMPRESSION:  Increased bilateral air space disease suggesting edema or pneumonia.  Recommend follow-up radiograph to ensure resolution.  Original Report Authenticated By: Genevive Bi, M.D.      Assessment/Plan: Artez Regis is a 36 y.o. male with  HIV AIDS recent PCP pneumonia readmitted with renal failure likely multifactorial in the setting of hypoperfusion nonsteroidals Bactrim and tenofovir  1) Renal failure: Him closely followed by nephrology off Bactrim now often not severe on appropriate renally dosed medications  2) HIV-AIDS: Continue Prezista norvir zidovudine and lamivudine, continue dapsone for PCP prophylaxis   LOS: 6 days   Acey Lav 06/05/2011, 2:40 PM

## 2011-06-05 NOTE — Progress Notes (Signed)
Nutrition Follow-up  Diet Order:  Regular  PO's:  50-75% meal completion  Supplements:  Ensure Complete BID; MVI daily  Meds: Scheduled Meds:   . azithromycin  1,200 mg Oral Q7 days  . dapsone  100 mg Oral Daily  . darbepoetin (ARANESP) injection - NON-DIALYSIS  100 mcg Subcutaneous Q Mon-1800  . darunavir  800 mg Oral Q breakfast  . feeding supplement  237 mL Oral BID BM  . ferrous sulfate  325 mg Oral TID WC  . fluconazole  100 mg Oral Weekly  . lamiVUDine  150 mg Oral Daily  . megestrol  400 mg Oral Daily  . mirtazapine  15 mg Oral QHS  . mulitivitamin with minerals  1 tablet Oral Daily  . ritonavir  100 mg Oral Q breakfast  . sodium bicarbonate  50 mEq Intravenous Once  . sodium bicarbonate  50 mEq Intravenous Once  . sodium polystyrene  30 g Oral Once  . zidovudine  300 mg Oral Q12H  . DISCONTD: sodium bicarbonate  25 mEq Intravenous TID PC & HS  . DISCONTD: sodium bicarbonate  50 mEq Intravenous Once  . DISCONTD: sodium bicarbonate  325 mg Oral Once   Continuous Infusions:   . sodium chloride 100 mL/hr at 06/05/11 0748   PRN Meds:.acetaminophen, acetaminophen, albuterol, calcium carbonate, promethazine, traMADol  Labs:  CMP     Component Value Date/Time   NA 137 06/05/2011 0635   K 4.3 06/05/2011 0635   CL 109 06/05/2011 0635   CO2 18* 06/05/2011 0635   GLUCOSE 93 06/05/2011 0635   BUN 67* 06/05/2011 0635   CREATININE 5.06* 06/05/2011 0635   CREATININE 1.03 05/22/2011 1451   CALCIUM 8.0* 06/05/2011 0635   PROT 6.8 05/30/2011 2319   ALBUMIN 1.7* 06/05/2011 0635   AST 14 05/30/2011 2319   ALT 21 05/30/2011 2319   ALKPHOS 124* 05/30/2011 2319   BILITOT 0.4 05/30/2011 2319   GFRNONAA 13* 06/05/2011 0635   GFRAA 16* 06/05/2011 0635    Albumin continues to decline with acute stress response.  Albumin has a half-life of 21 days and is strongly related to stress response and inflammatory process, therefore, do not expect to see an improvement in this lab value during acute  hospitalization.  Intake/Output Summary (Last 24 hours) at 06/05/11 1003 Last data filed at 06/05/11 0900  Gross per 24 hour  Intake   2490 ml  Output    750 ml  Net   1740 ml    WOC RN consulted for wound on buttocks--full thickness wound, not a pressure ulcer.  Weight Status:  56.11 kg (up from 43.4 kg 5/11)  BMI=16.8  Re-estimated needs:  1800-2000 kcals, 80-90 grams protein, 1.8-2 liters fluid daily  Nutrition Dx:  Inadequate oral intake, progressing.  Goal:  Oral intake to meet >90% of estimated nutrition needs, progressing.  Expect patient is meeting nutrition needs if he drinks 2 Ensure Complete supplements and consumes 50-75% of meals.  Intervention:  Continue Ensure Complete BID, MVI daily.  Monitor:  PO intake, weight trend.   Hettie Holstein Pager #:  [phone]

## 2011-06-05 NOTE — Progress Notes (Signed)
FMTS Attending Note  Patient seen and examined by me, discussed with resident team and I agree with Dr. Yetta Numbers assessment and plan.  Daniel House is markedly more dyspneic this morning.  A review of this I/O's shows him to be markedly positive over the course of the admission.  Agree with plan to hold his IV fluids and give iv lasix in setting of dyspnea and pulmonary edema.  To continue to follow creatinine, which continues to rise.  Suspected medication-induced ATN.  Paula Compton, MD

## 2011-06-05 NOTE — Progress Notes (Signed)
Physical Therapy Treatment Patient Details Name: Daniel House MRN: [id number] DOB: 05/12/1975 Today's Date: 06/05/2011 Time: 4098-1191 PT Time Calculation (min): 16 min  PT Assessment / Plan / Recommendation Comments on Treatment Session  Low activity tolerance mostly due to back pain and resulted SOB.  Brought RW to room so pt could get up more readily with staff.    Follow Up Recommendations  No PT follow up;Home health PT;Supervision - Intermittent    Barriers to Discharge        Equipment Recommendations  Other (comment)    Recommendations for Other Services    Frequency Min 3X/week   Plan Discharge plan remains appropriate    Precautions / Restrictions Precautions Precautions: Fall Restrictions Weight Bearing Restrictions: No   Pertinent Vitals/Pain     Mobility  Bed Mobility Bed Mobility: Supine to Sit;Sitting - Scoot to Edge of Bed;Sit to Supine Supine to Sit: 6: Modified independent (Device/Increase time);HOB elevated Sitting - Scoot to Edge of Bed: 7: Independent Sit to Supine: 6: Modified independent (Device/Increase time) Details for Bed Mobility Assistance: moving himself well in bed Transfers Transfers: Sit to Stand;Stand to Sit Sit to Stand: 5: Supervision;With upper extremity assist;From bed Stand to Sit: 4: Min guard;With upper extremity assist;To bed Details for Transfer Assistance: closer supervision at end of session due to fatigue and pain Ambulation/Gait Ambulation/Gait Assistance: 4: Min guard Ambulation Distance (Feet): 110 Feet Assistive device: Rolling walker Ambulation/Gait Assistance Details: guarded tremulous steps due to weakness and back pain Gait Pattern: Step-through pattern;Decreased step length - right;Decreased step length - left;Decreased stride length Gait velocity: decreased Stairs: No    Exercises     PT Diagnosis:    PT Problem List:   PT Treatment Interventions:     PT Goals Acute Rehab PT Goals Time For Goal  Achievement: 06/10/11 Potential to Achieve Goals: Good PT Goal: Supine/Side to Sit - Progress: Progressing toward goal PT Goal: Sit at Edge Of Bed - Progress: Progressing toward goal PT Goal: Sit to Stand - Progress: Progressing toward goal PT Goal: Stand to Sit - Progress: Progressing toward goal PT Transfer Goal: Bed to Chair/Chair to Bed - Progress: Progressing toward goal PT Goal: Ambulate - Progress: Progressing toward goal  Visit Information  Last PT Received On: 06/05/11 Assistance Needed: +1    Subjective Data  Patient Stated Goal: Wants to feel stronger/better   Cognition  Overall Cognitive Status: Appears within functional limits for tasks assessed/performed Arousal/Alertness: Awake/alert Orientation Level: Appears intact for tasks assessed Behavior During Session: [name] for tasks performed    Balance  Balance Balance Assessed: No  End of Session PT - End of Session Activity Tolerance: Patient limited by fatigue;Patient limited by pain Patient left: in bed;with call bell/phone within reach;with family/visitor present Nurse Communication: Mobility status    [name], [name] 06/05/2011, 11:20 AM  06/05/2011  [place] Bing, PT [phone] (pager)

## 2011-06-06 ENCOUNTER — Ambulatory Visit: Payer: Self-pay | Admitting: Family Medicine

## 2011-06-06 LAB — CBC
HCT: 18.2 % — ABNORMAL LOW (ref 39.0–52.0)
Hemoglobin: 5.8 g/dL — CL (ref 13.0–17.0)
MCH: 30.4 pg (ref 26.0–34.0)
MCV: 95.3 fL (ref 78.0–100.0)
Platelets: 210 10*3/uL (ref 150–400)
RBC: 1.91 MIL/uL — ABNORMAL LOW (ref 4.22–5.81)

## 2011-06-06 LAB — RENAL FUNCTION PANEL
Albumin: 1.8 g/dL — ABNORMAL LOW (ref 3.5–5.2)
BUN: 65 mg/dL — ABNORMAL HIGH (ref 6–23)
Chloride: 104 mEq/L (ref 96–112)
Creatinine, Ser: 5.18 mg/dL — ABNORMAL HIGH (ref 0.50–1.35)
Glucose, Bld: 94 mg/dL (ref 70–99)

## 2011-06-06 LAB — RETICULOCYTES
RBC.: 1.86 MIL/uL — ABNORMAL LOW (ref 4.22–5.81)
Retic Count, Absolute: 44.6 10*3/uL (ref 19.0–186.0)
Retic Ct Pct: 2.4 % (ref 0.4–3.1)

## 2011-06-06 LAB — IRON AND TIBC
Saturation Ratios: 38 % (ref 20–55)
UIBC: 90 ug/dL — ABNORMAL LOW (ref 125–400)

## 2011-06-06 LAB — PROTEIN, URINE, 24 HOUR
Protein, 24H Urine: 885 mg/d — ABNORMAL HIGH (ref 50–100)
Protein, Urine: 59 mg/dL

## 2011-06-06 LAB — OCCULT BLOOD X 1 CARD TO LAB, STOOL: Fecal Occult Bld: NEGATIVE

## 2011-06-06 LAB — HEMOGLOBIN AND HEMATOCRIT, BLOOD: Hemoglobin: 9.2 g/dL — ABNORMAL LOW (ref 13.0–17.0)

## 2011-06-06 LAB — HEMOGLOBIN: Hemoglobin: 9.5 g/dL — ABNORMAL LOW (ref 13.0–17.0)

## 2011-06-06 LAB — MPO/PR-3 (ANCA) ANTIBODIES: Myeloperoxidase Abs: <1 AU/mL (ref <20)

## 2011-06-06 MED ORDER — ABACAVIR SULFATE 300 MG PO TABS
600.0000 mg | ORAL_TABLET | Freq: Every day | ORAL | Status: DC
  Administered 2011-06-06 – 2011-06-12 (×7): 600 mg via ORAL
  Filled 2011-06-06 (×7): qty 2

## 2011-06-06 MED ORDER — FUROSEMIDE 10 MG/ML IJ SOLN
INTRAMUSCULAR | Status: AC
  Filled 2011-06-06: qty 4

## 2011-06-06 MED ORDER — FUROSEMIDE 10 MG/ML IJ SOLN
20.0000 mg | Freq: Once | INTRAMUSCULAR | Status: AC
  Administered 2011-06-06: 20 mg via INTRAVENOUS
  Filled 2011-06-06: qty 2

## 2011-06-06 MED ORDER — ATOVAQUONE 750 MG/5ML PO SUSP
1500.0000 mg | Freq: Every day | ORAL | Status: DC
  Administered 2011-06-06 – 2011-06-12 (×7): 1500 mg via ORAL
  Filled 2011-06-06 (×7): qty 10

## 2011-06-06 NOTE — Progress Notes (Signed)
Physical Therapy Treatment Patient Details Name: Daniel House MRN: [id number] DOB: 10/19/1975 Today's Date: 06/06/2011 Time: 1191-4782 PT Time Calculation (min): 16 min  PT Assessment / Plan / Recommendation Comments on Treatment Session  Low activity tolerance mostly due to fatigue.  Asked pt to walk with staffing in the halls this weekend.    Follow Up Recommendations  No PT follow up;Home health PT;Supervision - Intermittent    Barriers to Discharge        Equipment Recommendations       Recommendations for Other Services    Frequency Min 3X/week   Plan Discharge plan remains appropriate    Precautions / Restrictions Precautions Precautions: Fall Restrictions Weight Bearing Restrictions: No   Pertinent Vitals/Pain     Mobility  Bed Mobility Bed Mobility: Supine to Sit;Sitting - Scoot to Edge of Bed;Sit to Supine Supine to Sit: 6: Modified independent (Device/Increase time);HOB elevated Sitting - Scoot to Edge of Bed: 7: Independent Sit to Supine: 6: Modified independent (Device/Increase time) Details for Bed Mobility Assistance: moving himself well in bed Transfers Transfers: Sit to Stand;Stand to Sit Sit to Stand: 5: Supervision;With upper extremity assist;From bed Stand to Sit: 5: Supervision;With upper extremity assist;To bed Details for Transfer Assistance: pt still quite fatigued, but not as unsteady today and needed safety only Ambulation/Gait Ambulation/Gait Assistance: 5: Supervision Ambulation Distance (Feet): 140 Feet Assistive device: Rolling walker Ambulation/Gait Assistance Details: straight posture, short stride length, light use of the RW Gait Pattern: Within Functional Limits;Decreased stride length Gait velocity: decreased Stairs: No    Exercises     PT Diagnosis:    PT Problem List:   PT Treatment Interventions:     PT Goals Acute Rehab PT Goals Time For Goal Achievement: 06/10/11 Potential to Achieve Goals: Good PT Goal: Supine/Side to  Sit - Progress: Met PT Goal: Sit at Edge Of Bed - Progress: Met PT Goal: Sit to Stand - Progress: Met PT Goal: Stand to Sit - Progress: Progressing toward goal PT Transfer Goal: Bed to Chair/Chair to Bed - Progress: Progressing toward goal PT Goal: Ambulate - Progress: Progressing toward goal  Visit Information  Last PT Received On: 06/06/11 Assistance Needed: +1    Subjective Data  Subjective: Back much less painful. Patient Stated Goal: Wants to feel stronger/better   Cognition  Overall Cognitive Status: Appears within functional limits for tasks assessed/performed Arousal/Alertness: Awake/alert Orientation Level: Appears intact for tasks assessed Behavior During Session: [facility] for tasks performed    Balance  Balance Balance Assessed: No  End of Session PT - End of Session Activity Tolerance: Patient limited by fatigue Patient left: in bed;with call bell/phone within reach Nurse Communication: Mobility status    Lillyrose Reitan, [name] 06/06/2011, 2:24 PM   06/06/2011  [place] Bing, PT [phone] (pager)

## 2011-06-06 NOTE — Progress Notes (Signed)
FMTS Attending Note  Patient seen and examined by me, discussed with resident team.  Acute drop in Hgb, raises concern for hemolysis.  Transfusion of 2 units PRBCs today.  ?Consideration of Dapsone as cause of hemolysis? Respir status appears mildly improved from clinical standpoint, but still quite dyspneic with mild exertion. Paula Compton, MD

## 2011-06-06 NOTE — Progress Notes (Signed)
CRITICAL VALUE ALERT  Critical value received:  5.8   Date of notification:  06-06-11  Time of notification:  0720  Critical value read back:yes  Nurse who received alert:  Mickey Farber  MD notified (1st page):  yes  Time of first page:  0725  MD notified (2nd page):pager  Time of second WUJW:1191  Responding MD:  McGill  Time MD responded:  [phone]

## 2011-06-06 NOTE — Progress Notes (Signed)
[facility] Progress Note    Subjective:   Reports some shortness of breath with excertion.  Denies any cough or hemoptysis. Reports improvement of his appetite and is eating food ordered from [employer]. On further review, he states that he has been not able to do anything at home for a while    Objective:   BP 139/81  Pulse 133  Temp(Src) 97.6 F (36.4 C) (Oral)  Resp 20  Ht 6' (1.829 m)  Wt 47.854 kg (105 lb 8 oz)  BMI 14.31 kg/m2  SpO2 93%  Intake/Output Summary (Last 24 hours) at 06/06/11 9562 Last data filed at 06/06/11 1308  Gross per 24 hour  Intake    540 ml  Output   1425 ml  Net   -885 ml   Weight change:   Physical Exam: Gen: Comfortably resting in bed, visibly dyspneic CVS: Pulse regular tachycardia, S1 and S2 with an ejection systolic murmur Resp: Bilaterally clear to auscultation without any obvious rales, retractions or rhonchi Abd: Soft, flat, nontender and bowel sounds are normal Ext: No edema over either lower extremity  Imaging: Dg Chest 2 View  06/05/2011  *RADIOLOGY REPORT*  Clinical Data: Short of breath, weakness , smoking history  CHEST - 2 VIEW  Comparison: Five plain film 05/30/2011  Findings: There is increased bilateral air space disease.  Trace pleural fluid is noted along the oblique fissures.  No pneumothorax.  No osseous abnormality.  IMPRESSION:  Increased bilateral air space disease suggesting edema or pneumonia.  Recommend follow-up radiograph to ensure resolution.  Original Report Authenticated By: Genevive Bi, M.D.    Labs: BMET  Lab 06/06/11 6578 06/05/11 4696 06/04/11 0930 06/03/11 2319 06/03/11 1831 06/03/11 0800 06/02/11 1753  NA 133* 137 130* 131* 130* 131* 129*  K 4.1 4.3 4.7 5.2* 4.6 4.7 4.6  CL 104 109 103 103 102 101 97  CO2 19 18* 14* 18* 18* 20 21  GLUCOSE 94 93 126* 107* 152* 91 103*  BUN 65* 67* 72* 76* 77* 74* 80*  CREATININE 5.18* 5.06* 4.75* 4.78* 4.92* 4.60* 4.57*  ALB -- -- -- -- --  -- --  CALCIUM 8.3* 8.0* 7.2* 7.1* 6.8* 6.4* 6.3*  PHOS 3.4 2.9 -- -- -- -- --   CBC  Lab 06/06/11 0644 06/04/11 0930 06/03/11 0800 06/02/11 0925 06/01/11 1838 06/01/11 0542 05/30/11 2319  WBC 4.1 4.4 5.3 5.8 -- -- --  NEUTROABS -- -- -- -- 5.2 5.3 6.2  HGB 5.8* 7.8* 7.7* 7.6* -- -- --  HCT 18.2* 25.0* 22.9* 22.1* -- -- --  MCV 95.3 98.0 92.3 89.8 -- -- --  PLT 210 105* 94* 71* -- -- --    Medications:       . azithromycin  1,200 mg Oral Q7 days  . dapsone  100 mg Oral Daily  . darbepoetin (ARANESP) injection - NON-DIALYSIS  100 mcg Subcutaneous Q Mon-1800  . darunavir  800 mg Oral Q breakfast  . feeding supplement  237 mL Oral BID BM  . ferrous sulfate  325 mg Oral TID WC  . fluconazole  100 mg Oral Weekly  . furosemide  20 mg Intravenous Once  . furosemide  40 mg Intravenous Once  . lamiVUDine  150 mg Oral Daily  . lidocaine  1 patch Transdermal Q24H  . megestrol  400 mg Oral Daily  . mirtazapine  15 mg Oral QHS  . mulitivitamin with minerals  1 tablet Oral Daily  . ritonavir  100 mg Oral Q breakfast  . sodium bicarbonate  50 mEq Intravenous Once  . zidovudine  300 mg Oral Q12H     Assessment/ Plan:   1. Acute renal failure with hyperkalemia: Suspected ATN with what appears to be hemodynamically and is in the face of ongoing Bactrim/nonsteroidal anti-inflammatory drug therapy. Renal function not improved in spite of intravenous fluids and currently appears to be having features of early pulmonary edema with a right sided fissural effusion. Fluids have been stopped and he did respond to lasix.  I encouraged liberal oral intake and to avoid volume depletion. Repeat u/a looks bland.  No acute electrolyte concerns are noted , no indication for HD. Baseline creatinine is 1.03 on 5/2 so still anticipate recovery.  2. Hyponatremia: Improving/stable with IVfs, monitor closely. Anticipate further improvement after diuresis. 3. Pneumocystis pneumonia: TMP/SMX stopped (due to  concerns with hyperkalemia/acute renal failure) and now on dapsone.  4. HIV/AIDS: Anti-retroviral therapy adjustments per infectious disease.  5. Anemia- on aranesp, lower, needs transfusion per primary team - probably anemia due to HIV and could be responsible for his SOB  6. Metabolic acidosis: Start oral bicarbonate supplementation if serum bicarbonate persistently less than 18. 7. Dispo- Overall patient quite debilitated- I too am concerned about the options if renal function does not get better.  However, I dont believe his current condition is due to his kidneys but rather his advanced HIV   Paislee Szatkowski A  06/06/2011, 9:03 AM

## 2011-06-06 NOTE — Progress Notes (Signed)
Daily Progress Note Daniel House. Daniel House, M.D., M.B.A  Family Medicine PGY-1 Pager [phone]   Subjective: Events: acute drop in Hgb overnight to 5.8   Objective: Vital signs in last 24 hours: Temp:  [97.6 F (36.4 C)-98.5 F (36.9 C)] 97.6 F (36.4 C) (05/17 0604) Pulse Rate:  [110-133] 133  (05/17 0604) Resp:  [20] 20  (05/17 0604) BP: (118-139)/(70-81) 139/81 mmHg (05/17 0604) SpO2:  [93 %-96 %] 93 % (05/17 0604) Weight:  [105 lb 8 oz (47.854 kg)] 105 lb 8 oz (47.854 kg) (05/16 1545) Weight change:  Last BM Date: 06/04/11  Filed Weights   06/03/11 0445 06/04/11 0602 06/05/11 1545  Weight: 112 lb 10.5 oz (51.1 kg) 123 lb 11.2 oz (56.11 kg) 105 lb 8 oz (47.854 kg)     Intake/Output from previous day: 05/16 0701 - 05/17 0700 In: 780 [P.O.:360; I.V.:420] Out: 1675 [Urine:1675] Intake/Output this shift:   Physical Exam  Constitutional: middle age AAM; distressed, tremulous, cachectic, sickly appearing Neck: Normal range of motion. Neck supple.  Cardiovascular: Regular rhythm and normal heart sounds. No murmur heard. Tachycardic.  Respiratory: Tachypneic and dyspneic on exam, mild crackles in bases bilaterally. He has no wheezes. He exhibits no tenderness.  GI: Soft. Bowel sounds are normal. He exhibits no distension.  Musculoskeletal: Normal range of motion. He exhibits no edema and no tenderness.  Neurological: He is alert and oriented to person, place, and time. CN2-12 grossly intact. Gait is wide-based and slow. Skin: 4cm x 5cm sacral ulcer covered by aquacel bandage    Lab Results:  Basename 06/06/11 0644 06/04/11 0930  WBC 4.1 4.4  HGB 5.8* 7.8*  HCT 18.2* 25.0*  PLT 210 105*   BMET  Basename 06/06/11 0640 06/05/11 0635  NA 133* 137  K 4.1 4.3  CL 104 109  CO2 19 18*  GLUCOSE 94 93  BUN 65* 67*  CREATININE 5.18* 5.06*  CALCIUM 8.3* 8.0*    Studies/Results: Dg Chest 2 View  06/05/2011  *RADIOLOGY REPORT*  Clinical Data: Short of breath, weakness ,  smoking history  CHEST - 2 VIEW  Comparison: Five plain film 05/30/2011  Findings: There is increased bilateral air space disease.  Trace pleural fluid is noted along the oblique fissures.  No pneumothorax.  No osseous abnormality.  IMPRESSION:  Increased bilateral air space disease suggesting edema or pneumonia.  Recommend follow-up radiograph to ensure resolution.  Original Report Authenticated By: Genevive Bi, M.D.    Medications:  Scheduled:    . azithromycin  1,200 mg Oral Q7 days  . dapsone  100 mg Oral Daily  . darbepoetin (ARANESP) injection - NON-DIALYSIS  100 mcg Subcutaneous Q Mon-1800  . darunavir  800 mg Oral Q breakfast  . feeding supplement  237 mL Oral BID BM  . ferrous sulfate  325 mg Oral TID WC  . fluconazole  100 mg Oral Weekly  . furosemide  20 mg Intravenous Once  . furosemide  40 mg Intravenous Once  . lamiVUDine  150 mg Oral Daily  . lidocaine  1 patch Transdermal Q24H  . megestrol  400 mg Oral Daily  . mirtazapine  15 mg Oral QHS  . mulitivitamin with minerals  1 tablet Oral Daily  . ritonavir  100 mg Oral Q breakfast  . sodium bicarbonate  50 mEq Intravenous Once  . zidovudine  300 mg Oral Q12H   AVW:[id number], acetaminophen, albuterol, calcium carbonate, promethazine, traMADol  Assessment/Plan: 36 yo male with h/o newly diagnosed HIV/AIDS (CD4 count of  20 on 05/22/11) who presents with worsening shortness of breath, weakness and intermittent chest pain and who was admitted for hyperkalemia, acute renal failure and metabolic acidosis.   # Acute renal failure: likely secondary to combination of poor oral intake and low volume status as well as recent use of bactrim, naproxen and truvada.  Renal US negative for obstruction. FeNa of 5 suggests ATN.  - worsening creatinine to 5.16 today, continue to trend up, 5.06 from 4.78 (baseline 1.03), urine output improved 2/2 lasix but won't continue today   # Hyperkalemia: Resolved, s/p calcium gluconate, D50,  insulin, sodium bicarb in the ED.  - stable s/p kayexalate x 1 on 5/13  # Metabolic Acidosis: likely secondary to renal failure. Resolved s/p isotonic bicarb fluids. Recurred 5/16 and given 50 meq bicarb x 2 and resolved - stable   # Anemia: Unclear etiology. Possibly 2/2 renal disease. Noticed that zidovudine can induce anemia, but this would most likely not be a 2 day onset phenomenon. Hemoglobin has trended down from 10.8 to 7.6 since admission. Stable hgb.  - continue darbepoetin  - acutely worsened; etiology hemolysis from dapsone vs active bleed of unknown location; - transfuse 2 units and get retic, LDH, and haptoglobin   # Hyponatremia: likely secondary to low volume and renal failure. Stable currently.  - no IVF fluids  # Chest pain and shortness of breath: No complaints of CP at this time.  CXR appears improved from previous and doesn't show any evidence of new pneumonia.  Cardiac enzymes negative x 2. Pulm edema 2/2 volume overload on 5/17 - Pulm edema improved with lasix  # Thrombocytopenia: decrease since early May. No petechia or bruising on exam. INR 1.27 with mildly elevated PT at 16.2. PTT normal.  - follow up peripheral smear for any evidence of schistocytes.  - improving - continue to trend  # HIV/AIDS: CD4 count of 20 on 05/22/11 and viral load 837K on 05/03/11. Repeat counts have mildly improved: viral load: 3667; CD4 count: 50 - continue current antiviral therapy per ID - continue azithromycin for MAC prophylaxis - Continue dapsone for PCP prophylaxis. Consider d/c dapsone under ID guidance is causing anemia   # Malnutrition: low albumin and cachectic on exam, secondary to AIDS wasting  - continue megace and mirtazapine  - continue ensure supplements    # Sacral Ulcer: seen by wound care who does not believe that it is decubitus but rather a sequelae of the HIV  - continue aquacel dressing and sitting on pillow   # Fen/Gi:  - SLIV - Regular diet   #  Prophylaxis:  -SCD's given low platelet count   # Dispo: keep inpatient until clinical improvement     LOS: 7 days   Mat Carne 06/06/2011, 8:17 AM

## 2011-06-06 NOTE — Progress Notes (Addendum)
Subjective: Dropped hemoglobin overnight   Antibiotics:  Anti-infectives     Start     Dose/Rate Route Frequency Ordered Stop   06/01/11 0800   Darunavir Ethanolate (PREZISTA) tablet 800 mg        800 mg Oral Daily with breakfast 05/31/11 1500     06/01/11 0800   ritonavir (NORVIR) tablet 100 mg        100 mg Oral Daily with breakfast 05/31/11 1500     05/31/11 1600   dapsone tablet 100 mg        100 mg Oral Daily 05/31/11 1500     05/31/11 1600   fluconazole (DIFLUCAN) tablet 100 mg        100 mg Oral Weekly 05/31/11 1500     05/31/11 1600   zidovudine (RETROVIR) capsule 300 mg        300 mg Oral Every 12 hours 05/31/11 1500     05/31/11 1600   lamiVUDine (EPIVIR) tablet 150 mg        150 mg Oral Daily 05/31/11 1500     05/31/11 1000   azithromycin (ZITHROMAX) tablet 1,200 mg        1,200 mg Oral Every 7 days 05/31/11 0640     05/31/11 1000   Darunavir Ethanolate (PREZISTA) tablet 800 mg  Status:  Discontinued        800 mg Oral Daily 05/31/11 0640 05/31/11 1137   05/31/11 1000   emtricitabine-tenofovir (TRUVADA) 200-300 MG per tablet 1 tablet  Status:  Discontinued        1 tablet Oral Daily 05/31/11 0640 05/31/11 1137   05/31/11 1000   ritonavir (NORVIR) tablet 100 mg  Status:  Discontinued        100 mg Oral Daily 05/31/11 0640 05/31/11 1137          Medications: Scheduled Meds:    . azithromycin  1,200 mg Oral Q7 days  . dapsone  100 mg Oral Daily  . darbepoetin (ARANESP) injection - NON-DIALYSIS  100 mcg Subcutaneous Q Mon-1800  . darunavir  800 mg Oral Q breakfast  . feeding supplement  237 mL Oral BID BM  . ferrous sulfate  325 mg Oral TID WC  . fluconazole  100 mg Oral Weekly  . furosemide  20 mg Intravenous Once  . furosemide  40 mg Intravenous Once  . lamiVUDine  150 mg Oral Daily  . lidocaine  1 patch Transdermal Q24H  . megestrol  400 mg Oral Daily  . mirtazapine  15 mg Oral QHS  . mulitivitamin with minerals  1 tablet Oral Daily  . ritonavir   100 mg Oral Q breakfast  . zidovudine  300 mg Oral Q12H   Continuous Infusions:    . sodium chloride 20 mL/hr at 06/05/11 1800   PRN Meds:.acetaminophen, acetaminophen, albuterol, calcium carbonate, promethazine, traMADol   Objective: Weight change:   Intake/Output Summary (Last 24 hours) at 06/06/11 1141 Last data filed at 06/06/11 0610  Gross per 24 hour  Intake    540 ml  Output   1425 ml  Net   -885 ml   Blood pressure 126/74, pulse 107, temperature 98.9 F (37.2 C), temperature source Oral, resp. rate 18, height 6' (1.829 m), weight 105 lb 8 oz (47.854 kg), SpO2 93.00%. Temp:  [97.6 F (36.4 C)-98.9 F (37.2 C)] 98.9 F (37.2 C) (05/17 1137) Pulse Rate:  [102-133] 107  (05/17 1137) Resp:  [16-20] 18  (05/17 1137) BP: (118-139)/(70-81) 126/74 mmHg (05/17  1137) SpO2:  [93 %-99 %] 93 % (05/17 1137) Weight:  [105 lb 8 oz (47.854 kg)] 105 lb 8 oz (47.854 kg) (05/16 1545)  Physical Exam: General: Alert and awake, oriented x3, not in any acute distress. HEENT: anicteric sclera, pupils reactive to light and accommodation, EOMI CVS regular rate, normal r,  no murmur rubs or gallops Chest: clear to auscultation bilaterally, no wheezing, rales or rhonchi Abdomen: soft nontender, nondistended, normal bowel sounds, Neuro: nonfocal  Lab Results:  Basename 06/06/11 0644 06/04/11 0930  WBC 4.1 4.4  HGB 5.8* 7.8*  HCT 18.2* 25.0*  PLT 210 105*    BMET  Basename 06/06/11 0640 06/05/11 0635  NA 133* 137  K 4.1 4.3  CL 104 109  CO2 19 18*  GLUCOSE 94 93  BUN 65* 67*  CREATININE 5.18* 5.06*  CALCIUM 8.3* 8.0*    Micro Results: Recent Results (from the past 240 hour(s))  MRSA PCR SCREENING     Status: Normal   Collection Time   05/31/11  5:09 AM      Component Value Range Status Comment   MRSA by PCR NEGATIVE  NEGATIVE  Final     Studies/Results: Dg Chest 2 View  06/05/2011  *RADIOLOGY REPORT*  Clinical Data: Short of breath, weakness , smoking history  CHEST -  2 VIEW  Comparison: Five plain film 05/30/2011  Findings: There is increased bilateral air space disease.  Trace pleural fluid is noted along the oblique fissures.  No pneumothorax.  No osseous abnormality.  IMPRESSION:  Increased bilateral air space disease suggesting edema or pneumonia.  Recommend follow-up radiograph to ensure resolution.  Original Report Authenticated By: Genevive Bi, M.D.      Assessment/Plan: Carvel Huskins is a 36 y.o. male with  HIV AIDS recent PCP pneumonia readmitted with renal failure likely multifactorial in the setting of hypoperfusion nonsteroidals Bactrim and tenofovir  1) Renal failure: Him closely followed by nephrology off Bactrim now often not severe on appropriate renally dosed medications  2) Anemia: Undoubtedly has anemia of chronic disease. Would certainly work him up for hemolytic anemia given that he is on dapsone. Note is G6PD level however was normal. Also make sure that folic acid and B12 been assessed. I think is reasonable changed him from dapsone to atovaquone while this is being worked up. Finally the AZT could be causing myelosuppression. His HLA  [id number] is negative so we can switch him to Abacavir    2) HIV-AIDS: Continue Prezista norvir  and lamivudine, and NOW ABACAVIR . Will also switched to atovaquone for PCP prophylaxis will anemia workup being done  Dr. Drue Second will be covering this weekend and is available for questions.    LOS: 7 days   Acey Lav 06/06/2011, 11:41 AM

## 2011-06-07 LAB — BASIC METABOLIC PANEL
CO2: 18 mEq/L — ABNORMAL LOW (ref 19–32)
Calcium: 8.7 mg/dL (ref 8.4–10.5)
Creatinine, Ser: 5.04 mg/dL — ABNORMAL HIGH (ref 0.50–1.35)
GFR calc non Af Amer: 13 mL/min — ABNORMAL LOW (ref >90)

## 2011-06-07 LAB — TYPE AND SCREEN: Unit division: 0

## 2011-06-07 LAB — CBC
MCH: 29.9 pg (ref 26.0–34.0)
MCHC: 33.6 g/dL (ref 30.0–36.0)
MCV: 89 fL (ref 78.0–100.0)
Platelets: 245 10*3/uL (ref 150–400)
RDW: 19.5 % — ABNORMAL HIGH (ref 11.5–15.5)

## 2011-06-07 LAB — CARDIAC PANEL(CRET KIN+CKTOT+MB+TROPI): CK, MB: 3.7 ng/mL (ref 0.3–4.0)

## 2011-06-07 LAB — FERRITIN: Ferritin: 2453 ng/mL — ABNORMAL HIGH (ref 22–322)

## 2011-06-07 LAB — FOLATE: Folate: 12.5 ng/mL

## 2011-06-07 MED ORDER — FUROSEMIDE 10 MG/ML IJ SOLN
40.0000 mg | Freq: Four times a day (QID) | INTRAMUSCULAR | Status: AC
  Administered 2011-06-07 (×2): 40 mg via INTRAVENOUS
  Filled 2011-06-07 (×2): qty 4

## 2011-06-07 MED ORDER — WHITE PETROLATUM GEL
Status: AC
  Filled 2011-06-07: qty 5

## 2011-06-07 NOTE — Progress Notes (Signed)
Family Medicine Teaching Service Brief Progress Note: Patient complaining of chest pain, worst with breathing, radiated to the back, sharp and lasts a few minutes. Had some indigestion earlier today, but this seems different. Denies any nausea, vomiting, diaphoresis.  VSS: T: 99.4, BP: 130/96, O2:99%, HR:89, RR:24.  No tenderness to palpation along chest wall.  No reproducible pain along back.  Lower extremities, feet are edematous, +2 bilaterally A/P: will get EKG and cardiac enzymes x2 to rule out any cardiac etiology.  Vital signs have stable: normal HR and oxygenation, making PE unlikely but with swollen lower extremities, will get lower extremity doppler  Marena Chancy PGY1 Family Medicine teaching Service [phone]

## 2011-06-07 NOTE — Plan of Care (Signed)
Patient complaining of intermittent chest pain. EKG done - normal per read by myself and Dr. Gwenlyn Saran as well as computer read.  Troponins slightly elevated: 0.57 -> but patient has renal failure with Cr of 5.04.   Plan: Now that patient has no chest pain and EKG normal, we will trend the troponins for a spike. I do not think this is cardiac.  Edd Arbour, MD 06/07/2011 7:57 PM

## 2011-06-07 NOTE — Progress Notes (Signed)
FMTS Attending Note  Patient seen and examined by me today, discussed with Dr. Rivka Safer (resident covering patient today).  Mr. Daniel House is slightly winded at time of my exam, having just gotten up to bathe.  Denies pain.    Still with some bibasilar rales on lung exam.  2+ ankle edema, worse L than R.  Renal function appears to have plateaued.  Hgb has remained relatively unchanged since post-transfusion H/H check yesterday.   Elevated LDH raises concern for hemolysis (possibly med induced).   Will follow along with ID, Nephrology plan for medication adjustment, decisions on need for further workup of acute kidney injury. Paula Compton, MD

## 2011-06-07 NOTE — Progress Notes (Signed)
Daily Progress Note  Subjective: Feels better overall.  Complains of feet pain. Still short of breath when he gets up and walks around.   Objective: Vital signs in last 24 hours: Temp:  [97.2 F (36.2 C)-99.2 F (37.3 C)] 97.2 F (36.2 C) (05/18 0512) Pulse Rate:  [85-107] 85  (05/18 0512) Resp:  [16-20] 18  (05/18 0512) BP: (125-148)/(74-92) 136/92 mmHg (05/18 0512) SpO2:  [93 %-100 %] 94 % (05/18 0512) Weight change:  Last BM Date: 06/04/11  Filed Weights   06/03/11 0445 06/04/11 0602 06/05/11 1545  Weight: 112 lb 10.5 oz (51.1 kg) 123 lb 11.2 oz (56.11 kg) 105 lb 8 oz (47.854 kg)   Intake/Output from previous day: 05/17 0701 - 05/18 0700 In: 1730 [P.O.:1030; Blood:700] Out: 1056 [Urine:1055; Stool:1] Intake/Output this shift:   Physical Exam  Constitutional: middle age AAM; distressed, tremulous, cachectic, sickly appearing Cardiovascular: Regular rhythm and normal heart sounds. flow murmur heard. Tachycardic.  Respiratory: Tachypneic and dyspneic on exam, mild crackles in bases bilaterally. He has no wheezes. He exhibits no tenderness.  GI: Soft. He exhibits no distension.  Musculoskeletal: Normal range of motion. He exhibits no edema and no tenderness.  Neurological: He is alert and oriented to person, place, and time. CN2-12 grossly intact.  Lab Results:  Basename 06/07/11 0457 06/06/11 1659 06/06/11 0644  WBC 3.8* -- 4.1  HGB 9.2* 9.2* --  HCT 27.4* 27.4* --  PLT 245 -- 210   BMET  Basename 06/07/11 0457 06/06/11 0640  NA 133* 133*  K 4.9 4.1  CL 107 104  CO2 18* 19  GLUCOSE 89 94  BUN 63* 65*  CREATININE 5.04* 5.18*  CALCIUM 8.7 8.3*    Studies/Results: No results found.  Medications:  Scheduled:    . abacavir  600 mg Oral Daily  . atovaquone  1,500 mg Oral Daily  . azithromycin  1,200 mg Oral Q7 days  . darbepoetin (ARANESP) injection - NON-DIALYSIS  100 mcg Subcutaneous Q Mon-1800  . darunavir  800 mg Oral Q breakfast  . feeding  supplement  237 mL Oral BID BM  . ferrous sulfate  325 mg Oral TID WC  . fluconazole  100 mg Oral Weekly  . furosemide      . furosemide  20 mg Intravenous Once  . lamiVUDine  150 mg Oral Daily  . lidocaine  1 patch Transdermal Q24H  . megestrol  400 mg Oral Daily  . mirtazapine  15 mg Oral QHS  . mulitivitamin with minerals  1 tablet Oral Daily  . ritonavir  100 mg Oral Q breakfast  . DISCONTD: dapsone  100 mg Oral Daily  . DISCONTD: zidovudine  300 mg Oral Q12H   ZOX:[id number], acetaminophen, albuterol, calcium carbonate, promethazine, traMADol  Assessment/Plan: 36 yo male with h/o newly diagnosed HIV/AIDS (CD4 count of 20 on 05/22/11) who presents with worsening shortness of breath, weakness and intermittent chest pain and who was admitted for hyperkalemia, acute renal failure, metabolic acidosis, and now pancytopenia with hemolysis s/p 2 units of prbc now stable.   # Acute renal failure: likely secondary to combination of poor oral intake and low volume status as well as recent use of bactrim, naproxen and truvada.  Renal US negative for obstruction. FeNa of 5 suggests ATN.  Lab Results  Component Value Date   CREATININE 5.04* 06/07/2011  slightly improved.  Per renal  # Hyperkalemia: Resolved, s/p calcium gluconate, D50, insulin, sodium bicarb in the ED.  - stable s/p kayexalate x  1 on 5/13 Lab Results  Component Value Date   K 4.9 06/07/2011   # Metabolic Acidosis: likely secondary to renal failure. Resolved s/p isotonic bicarb fluids. Recurred 5/16 and given 50 meq bicarb x 2 and resolved - stable   # Anemia: Unclear etiology.-  Appears to be hemolysis. Med related? Medications changed per ID. Possibly HIV related? Blood loss less likely. Elevated LDH and Haptoglobin.  - s/p transfusion of 2 units Lab Results  Component Value Date   HGB 9.2* 06/07/2011  stable  # Hyponatremia: likely secondary to low volume and renal failure. Stable currently.  - no IVF  fluids  # Chest pain and shortness of breath: No complaints of CP at this time.  CXR appears improved from previous and doesn't show any evidence of new pneumonia.  Cardiac enzymes negative x 2. Pulm edema 2/2 volume overload on 5/17 - Pulm edema improved with lasix  # Thrombocytopenia: decrease since early May. No petechia or bruising on exam. INR 1.27 with mildly elevated PT at 16.2. PTT normal.  - improving - continue to trend Lab Results  Component Value Date   PLT 245 06/07/2011   # HIV/AIDS: CD4 count of 20 on 05/22/11 and viral load 837K on 05/03/11. Repeat counts have mildly improved: viral load: 3667; CD4 count: 50 - continue current antiviral therapy per ID - continue azithromycin for MAC prophylaxis - Continue atovaquone for PCP prophylaxis. Dapsone stopped.    # Malnutrition: low albumin and cachectic on exam, secondary to AIDS wasting  - continue megace and mirtazapine  - continue ensure supplements    # Sacral Ulcer: seen by wound care who does not believe that it is decubitus but rather a sequelae of the HIV  - continue aquacel dressing and sitting on pillow   # Fen/Gi:  - SLIV - Regular diet   # Prophylaxis:  -SCD's given low platelet count   # Dispo: keep inpatient until clinical improvement     LOS: 8 days   Myshawn Chiriboga MD 06/07/2011, 9:56 AM

## 2011-06-07 NOTE — Progress Notes (Signed)
[facility] Progress Note    Subjective:   Reports some shortness of breath with excertion.  Denies any cough or hemoptysis. Reports improvement of his appetite and is eating food ordered from [employer]. On further review, he states that he has been not able to do anything at home for a while    Objective:   BP 133/90  Pulse 86  Temp(Src) 97.8 F (36.6 C) (Oral)  Resp 20  Ht 6' (1.829 m)  Wt 47.854 kg (105 lb 8 oz)  BMI 14.31 kg/m2  SpO2 96%  Intake/Output Summary (Last 24 hours) at 06/07/11 1700 Last data filed at 06/07/11 0513  Gross per 24 hour  Intake      0 ml  Output    575 ml  Net   -575 ml   Weight change:   Physical  Gen: no distress, calm CVS: Pulse regular tachycardia, S1 and S2 with an ejection systolic murmur Resp: crackles right base, left clear Abd: Soft, flat, nontender and bowel sounds are normal Ext:  + leg edema bilat ankles and feet which is new  Imaging: No results found.  Labs: BMET  Lab 06/07/11 0457 06/06/11 0640 06/05/11 0635 06/04/11 0930 06/03/11 2319 06/03/11 1831 06/03/11 0800  NA 133* 133* 137 130* 131* 130* 131*  K 4.9 4.1 4.3 4.7 5.2* 4.6 4.7  CL 107 104 109 103 103 102 101  CO2 18* 19 18* 14* 18* 18* 20  GLUCOSE 89 94 93 126* 107* 152* 91  BUN 63* 65* 67* 72* 76* 77* 74*  CREATININE 5.04* 5.18* 5.06* 4.75* 4.78* 4.92* 4.60*  ALB -- -- -- -- -- -- --  CALCIUM 8.7 8.3* 8.0* 7.2* 7.1* 6.8* 6.4*  PHOS -- 3.4 2.9 -- -- -- --   CBC  Lab 06/07/11 0457 06/06/11 1659 06/06/11 1658 06/06/11 0644 06/04/11 0930 06/03/11 0800 06/01/11 1838 06/01/11 0542  WBC 3.8* -- -- 4.1 4.4 5.3 -- --  NEUTROABS -- -- -- -- -- -- 5.2 5.3  HGB 9.2* 9.2* 9.5* 5.8* -- -- -- --  HCT 27.4* 27.4* -- 18.2* 25.0* -- -- --  MCV 89.0 -- -- 95.3 98.0 92.3 -- --  PLT 245 -- -- 210 105* 94* -- --     Assessment/ Plan:   1. Acute renal failure with hyperkalemia: Suspected ATN with what appears to be hemodynamically and is in the face  of ongoing Bactrim/nonsteroidal anti-inflammatory drug therapy. Renal function not improved in spite of intravenous fluids and currently appears to be having features of early pulmonary edema with a right sided fissural effusion. Fluids have been stopped and he did respond to lasix.  I encouraged liberal oral intake and to avoid volume depletion. Repeat u/a looks bland.  No acute electrolyte concerns are noted , no indication for HD. Baseline creatinine is 1.03 on 5/2 so still anticipate recovery.  2. Dyspnea- due to pulm edema, see 5/16 cxr. Responded to IV lasix 20-40 mg. Breathing better, but still overloaded on exam. Will repeat IV lasix today.  3. Hyponatremia: Improving/stable with IVfs, monitor closely. Anticipate further improvement after diuresis. 4. Pneumocystis pneumonia: TMP/SMX stopped (due to concerns with hyperkalemia/acute renal failure) and now on dapsone.  5. HIV/AIDS: Anti-retroviral therapy adjustments per infectious disease.  6. Anemia- on aranesp, lower, needs transfusion per primary team - probably anemia due to HIV and could be responsible for his SOB  7. Metabolic acidosis: Start oral bicarbonate supplementation if serum bicarbonate persistently less than 18. 8.  Dispo- Overall patient quite debilitated- I too am concerned about the options if renal function does not get better.  However, I dont believe his current condition is due to his kidneys but rather his advanced HIV   Adalea Handler D  06/07/2011, 5:00 PM

## 2011-06-08 DIAGNOSIS — M7989 Other specified soft tissue disorders: Secondary | ICD-10-CM

## 2011-06-08 LAB — CBC
HCT: 24.7 % — ABNORMAL LOW (ref 39.0–52.0)
MCH: 30.4 pg (ref 26.0–34.0)
MCV: 89.5 fL (ref 78.0–100.0)
Platelets: 314 10*3/uL (ref 150–400)
RDW: 19.6 % — ABNORMAL HIGH (ref 11.5–15.5)

## 2011-06-08 LAB — CARDIAC PANEL(CRET KIN+CKTOT+MB+TROPI)
CK, MB: 3.5 ng/mL (ref 0.3–4.0)
CK, MB: 3.1 ng/mL (ref 0.3–4.0)
Total CK: 44 U/L (ref 7–232)
Troponin I: 0.71 ng/mL (ref <0.30)

## 2011-06-08 LAB — BASIC METABOLIC PANEL
Calcium: 8.5 mg/dL (ref 8.4–10.5)
GFR calc Af Amer: 15 mL/min — ABNORMAL LOW (ref >90)
GFR calc non Af Amer: 13 mL/min — ABNORMAL LOW (ref >90)
Glucose, Bld: 95 mg/dL (ref 70–99)
Sodium: 133 mEq/L — ABNORMAL LOW (ref 135–145)

## 2011-06-08 MED ORDER — FUROSEMIDE 10 MG/ML IJ SOLN
80.0000 mg | Freq: Three times a day (TID) | INTRAMUSCULAR | Status: DC
  Administered 2011-06-08 – 2011-06-10 (×6): 80 mg via INTRAVENOUS
  Filled 2011-06-08 (×8): qty 8

## 2011-06-08 MED ORDER — ENOXAPARIN SODIUM 40 MG/0.4ML ~~LOC~~ SOLN
40.0000 mg | SUBCUTANEOUS | Status: DC
  Administered 2011-06-08: 40 mg via SUBCUTANEOUS
  Filled 2011-06-08 (×2): qty 0.4

## 2011-06-08 MED ORDER — SUCRALFATE 1 G PO TABS
1.0000 g | ORAL_TABLET | Freq: Three times a day (TID) | ORAL | Status: DC
  Administered 2011-06-08 – 2011-06-09 (×5): 1 g via ORAL
  Filled 2011-06-08 (×9): qty 1

## 2011-06-08 MED ORDER — SODIUM BICARBONATE 650 MG PO TABS
650.0000 mg | ORAL_TABLET | Freq: Two times a day (BID) | ORAL | Status: DC
  Administered 2011-06-08: 650 mg via ORAL
  Filled 2011-06-08 (×2): qty 1

## 2011-06-08 NOTE — Progress Notes (Signed)
[facility] Attending Note  Patient seen and examined by me, discussed with resident team.  Mr. Signor reports that the left-center chest pain is resolved now.  He does not associate with with deep breaths, nor with eating.  No dysphagia or odynophagia.  His dyspnea is unchanged from previous.   Renal function is relatively unchanged from previous.  Hgb this morning has dropped 0.8 from yesterday.   Alert and awake, appears comfortable and in no distress HEENT Neck supple. Full ROM.  COR Chest wall without tenderness.  Regular S1S2  PULM Diffuse rales bilaterally.  ABD Soft, nontender.   2+ bilateral ankle edema.   Assess/Plan: Patient with AIDS, admitted with acute renal failure.  His complaint of chest pain does not sound cardiac in nature. I suspect elevated Trop I is more likely related to renal dysfunction, as his ECGs are not consistent with ischemia/infarct.  Other considerations to ponder might be esophageal candidiasis, versus GERD/gastritis. To watch Hgb closely, in setting of suspected hemolysis.  Paula Compton, MD

## 2011-06-08 NOTE — Progress Notes (Signed)
MD aware of elevated Troponin level of 0.71. Repeat EKG order received.

## 2011-06-08 NOTE — Progress Notes (Signed)
VASCULAR LAB PRELIMINARY  PRELIMINARY  PRELIMINARY  PRELIMINARY  Bilateral lower extremity venous duplex  completed.    Preliminary report:  Bilateral:  No evidence of DVT, superficial thrombosis, or Baker's Cyst.   Terance Hart, RVT 06/08/2011, 1:41 PM

## 2011-06-08 NOTE — Progress Notes (Signed)
[facility] - PROGRESS NOTE Resident Note   Please see below for attending addendum to resident note.  Subjective:   He feels tired, not able to sleep.  He reports intermittent sharp left-sided pleuritic chest pain in the past several days.  No other complaints    Objective:    Vital Signs:   Temp:  [97.8 F (36.6 C)-98.7 F (37.1 C)] 98.2 F (36.8 C) (05/19 0544) Pulse Rate:  [69-100] 100  (05/19 0544) Resp:  [20] 20  (05/19 0544) BP: (126-133)/(83-90) 132/84 mmHg (05/19 0544) SpO2:  [96 %-97 %] 97 % (05/19 0544) Weight:  [131 lb 11.2 oz (59.739 kg)] 131 lb 11.2 oz (59.739 kg) (05/19 0613) Last BM Date: 06/07/11  24-hour weight change: Weight change:   Weight trends: Filed Weights   06/04/11 0602 06/05/11 1545 06/08/11 0613  Weight: 123 lb 11.2 oz (56.11 kg) 105 lb 8 oz (47.854 kg) 131 lb 11.2 oz (59.739 kg)    Intake/Output:  05/18 0701 - 05/19 0700 In: 250 [P.O.:250] Out: 1600 [Urine:1600]  Physical Exam: General: Vital signs reviewed and noted. Thin appearing, in no acute distress; alert, appropriate and cooperative throughout examination.  Lungs:  Normal respiratory effort. Clear to auscultation BL without crackles or wheezes.  Heart: tachycardic. S1 and S2 normal without gallop, +systolic murmur, or rubs.  Abdomen:  BS normoactive. Soft, Nondistended, non-tender.    Extremities: +1-2 pitting edema on LE more on ankle and feet.     Labs: Basic Metabolic Panel:  Lab 06/08/11 1610 06/07/11 0457 06/06/11 0640 06/05/11 0635 06/04/11 0930  NA 133* 133* 133* 137 130*  K 4.5 4.9 4.1 4.3 4.7  CL 104 107 104 109 103  CO2 17* 18* 19 18* 14*  GLUCOSE 95 89 94 93 126*  BUN 61* 63* 65* 67* 72*  CREATININE 5.14* 5.04* 5.18* 5.06* 4.75*  CALCIUM 8.5 8.7 8.3* -- --  MG -- -- -- -- --  PHOS -- -- 3.4 2.9 --    Liver Function Tests:  Lab 06/06/11 0640 06/05/11 0635  AST -- --  ALT -- --  ALKPHOS -- --  BILITOT -- --  PROT -- --  ALBUMIN 1.8*  1.7*    CBC:  Lab 06/08/11 0904 06/07/11 0457 06/06/11 1659 06/06/11 1658 06/06/11 0644 06/04/11 0930 06/03/11 0800 06/01/11 1838  WBC 3.7* 3.8* -- -- 4.1 4.4 5.3 --  NEUTROABS -- -- -- -- -- -- -- 5.2  HGB 8.4* 9.2* 9.2* 9.5* 5.8* -- -- --  HCT 24.7* 27.4* 27.4* -- 18.2* 25.0* -- --  MCV 89.5 89.0 -- -- 95.3 98.0 92.3 --  PLT 314 245 -- -- 210 105* 94* --    Cardiac Enzymes:  Lab 06/08/11 0547 06/08/11 0209 06/07/11 1832  CKTOTAL 44 57 57  CKMB 3.1 3.5 3.7  CKMBINDEX -- -- --  TROPONINI 0.46* 0.71* 0.57*    Microbiology: Results for orders placed during the hospital encounter of 05/30/11  MRSA PCR SCREENING     Status: Normal   Collection Time   05/31/11  5:09 AM      Component Value Range Status Comment   MRSA by PCR NEGATIVE  NEGATIVE  Final     Urinalysis:  Basename 06/05/11 1806  COLORURINE YELLOW  LABSPEC 1.009  PHURINE 7.5  GLUCOSEU 100*  HGBUR SMALL*  BILIRUBINUR NEGATIVE  KETONESUR NEGATIVE  PROTEINUR 30*  UROBILINOGEN 1.0  NITRITE NEGATIVE  LEUKOCYTESUR NEGATIVE      Medications:    Infusions:    .  sodium chloride 20 mL/hr at 06/05/11 1800    Scheduled Medications:    . abacavir  600 mg Oral Daily  . atovaquone  1,500 mg Oral Daily  . azithromycin  1,200 mg Oral Q7 days  . darbepoetin (ARANESP) injection - NON-DIALYSIS  100 mcg Subcutaneous Q Mon-1800  . darunavir  800 mg Oral Q breakfast  . enoxaparin (LOVENOX) injection  40 mg Subcutaneous Q24H  . feeding supplement  237 mL Oral BID BM  . ferrous sulfate  325 mg Oral TID WC  . fluconazole  100 mg Oral Weekly  . furosemide  40 mg Intravenous Q6H  . lamiVUDine  150 mg Oral Daily  . lidocaine  1 patch Transdermal Q24H  . megestrol  400 mg Oral Daily  . mirtazapine  15 mg Oral QHS  . mulitivitamin with minerals  1 tablet Oral Daily  . ritonavir  100 mg Oral Q breakfast  . sodium bicarbonate  650 mg Oral BID  . sucralfate  1 g Oral TID WC & HS  . white petrolatum        PRN  Medications: acetaminophen, acetaminophen, albuterol, calcium carbonate, promethazine, traMADol   Assessment/ Plan:    1. Acute renal failure with hyperkalemia: likely ATN 2/2 Bactrim/Tenofovir/nonsteroidal anti-inflammatory drug therapy/Truvada. Renal function stabilizes at ~5 in the past 3 days.  Adequate urine output in past 24 hours of 1600cc with 2 doses of IV Lasix 40mg . His weight has been fluctuating from 123-->105-->131 pounds, not sure if it is accurate, will ask nurse to repeat. Will give additional IV Lasix today 60mg  bid. We are hopeful that his kidney function will recovery. No indication for dialysis at this time. Repeat renal panel in AM. 2.  Dyspnea- due to pulm edema, see 5/16 cxr. Improving with IV Lasix 3. Hyponatremia: Likely hypervolemic hyponatremia. stable at 133. Continue diuresis 4. Pneumocystis pneumonia: TMP/SMX stopped and patient was switched to Dapsone (due to concerns with hyperkalemia/acute renal failure). Continue dapsone.  5. HIV/AIDS: Anti-retroviral therapy adjustments per infectious disea  6. Anemia- chronic disease likely due to HIV. Hb is stable at 8.4.  Anemia panel shows Ferritin 2453, iron 56, sat ratio 38, folate 12.5, and vitamin B12 304. Continue aranesp. D/C ferrous sulfate  7. Metabolic acidosis: AG of 12 and serum bicarb 17 . Continue oral bicarbonate supplementation 8. Chest pain: likely GERD/MSK.  Continue sucralfate.  LE Venous Doppler pending to rule out DVT 9. Dispo- pending clinical improvement  Length of Stay: 9 days  Patient history and plan of care reviewed with attending, Dr. Arlean Hopping.   Lindley Magnus, Internal Medicine Resident 06/08/2011, 11:25 AM  Patient seen and examined and agree with assessment and plan as above. Reweighed patient on standing scale at 125 lbs, which is 30 lbs over admission weight. Volume overloaded, will continue with IV lasix.  No gross uremic findings so will hold off on dialysis for now.  Patient aware he  may come to need dialysis if things get worse or don't get better.  Vinson Moselle  MD [facility] [phone] pgr    [phone] cell 06/08/2011, 3:37 PM

## 2011-06-08 NOTE — Progress Notes (Signed)
Daily Progress Note [name]. [name], M.D., M.B.A  Family Medicine PGY-1 Pager [phone]  Subjective:  Overnight: The patient developed chest pain that was left sided and pleuritic; troponin was elevated to 0.7, but in setting of AKI, that is not diagnostic of damage; additionally, the EKG was normal sinus rhythm  1. Chest Pain - This morning it is left sided, mild, worse with inspiration, similar to the pain he was having about 1.5 weeks ago when I spoke to the patient on the phone at his home 2. Breathing - Shortness of breath with ambulation, improved when lying down 3. Ab pain - "feels like indigestion"; not relieved by tums 4. Urination - still making urine  Objective: Vital signs in last 24 hours: Temp:  [97.8 F (36.6 C)-98.7 F (37.1 C)] 98.2 F (36.8 C) (05/19 0544) Pulse Rate:  [69-100] 100  (05/19 0544) Resp:  [20] 20  (05/19 0544) BP: (126-133)/(83-90) 132/84 mmHg (05/19 0544) SpO2:  [96 %-97 %] 97 % (05/19 0544) Weight:  [131 lb 11.2 oz (59.739 kg)] 131 lb 11.2 oz (59.739 kg) (05/19 0613) Weight change:  Last BM Date: 06/07/11  Filed Weights   06/04/11 0602 06/05/11 1545 06/08/11 4540  Weight: 123 lb 11.2 oz (56.11 kg) 105 lb 8 oz (47.854 kg) 131 lb 11.2 oz (59.739 kg)   Intake/Output from previous day: 05/18 0701 - 05/19 0700 In: 250 [P.O.:250] Out: 1600 [Urine:1600] Intake/Output this shift:   Physical Exam  Constitutional: middle age AAM; distressed, tremulous, cachectic, sickly appearing Cardiovascular: Regular rhythm and normal heart sounds. flow murmur heard. Tachycardic.  Respiratory: normal work of breathing. No rales He has no wheezes. He exhibits no tenderness.  GI: Soft. He exhibits no distension.  Musculoskeletal: Normal range of motion. He exhibits no edema and no tenderness.  Neurological: He is alert and oriented to person, place, and time. CN2-12 grossly intact. Lower Extremities: 1+ pitting edema proximal to the knee; 2+ between ankle and  knees; 3+ pitting edema of feet; negative homan's sign bilaterally  Lab Results:  Basename 06/07/11 0457 06/06/11 1659 06/06/11 0644  WBC 3.8* -- 4.1  HGB 9.2* 9.2* --  HCT 27.4* 27.4* --  PLT 245 -- 210   BMET  Basename 06/08/11 0547 06/07/11 0457  NA 133* 133*  K 4.5 4.9  CL 104 107  CO2 17* 18*  GLUCOSE 95 89  BUN 61* 63*  CREATININE 5.14* 5.04*  CALCIUM 8.5 8.7    Studies/Results: No results found.  Medications:  Scheduled:    . abacavir  600 mg Oral Daily  . atovaquone  1,500 mg Oral Daily  . azithromycin  1,200 mg Oral Q7 days  . darbepoetin (ARANESP) injection - NON-DIALYSIS  100 mcg Subcutaneous Q Mon-1800  . darunavir  800 mg Oral Q breakfast  . feeding supplement  237 mL Oral BID BM  . ferrous sulfate  325 mg Oral TID WC  . fluconazole  100 mg Oral Weekly  . furosemide  40 mg Intravenous Q6H  . lamiVUDine  150 mg Oral Daily  . lidocaine  1 patch Transdermal Q24H  . megestrol  400 mg Oral Daily  . mirtazapine  15 mg Oral QHS  . mulitivitamin with minerals  1 tablet Oral Daily  . ritonavir  100 mg Oral Q breakfast  . white petrolatum       JWJ:[id number], acetaminophen, albuterol, calcium carbonate, promethazine, traMADol  Assessment/Plan: 36 yo male with h/o newly diagnosed HIV/AIDS (CD4 count of 20 on 05/22/11) who presents  with worsening shortness of breath, weakness and intermittent chest pain and who was admitted for hyperkalemia, acute renal failure, metabolic acidosis, and now pancytopenia with hemolysis s/p 2 units of prbc now stable.   # Chest Pain - Acute onset 5/18, differential diagnosis includes MI, PE, pericarditis, MSK pain, GERD, pleuritis pain from known pleural effusion; troponin elevated, but in setting of renal failure not as accurate; EKG showed NSR x 2, so MI less likely; PE is possibly b/c not on lovenox 2/2 thrombocytopenia, is very sedentary and taking megace for appetite stimulation,  not a candidate for CTA and d-dimer likely  to be elevated 2/2 to acute illness so need to obtain venous dupplex dopplers; possibly a pericarditis, but cannot take NSAIDS 2/2 to kidney function  Cardiac Enzymes:  Lab 06/08/11 0547 06/08/11 0209 06/07/11 1832  CKTOTAL 44 57 57  CKMB 3.1 3.5 3.7  CKMBINDEX -- -- --  TROPONINI 0.46* 0.71* 0.57*   - Dopplers ordered to r/o DVT, may not be performed on the weekend; consider V/Q scan as needed - Add sucralfate to see if improvement in dyspepsia improves chest pain   # Acute renal failure: likely secondary to combination of poor oral intake and low volume status as well as recent use of bactrim, naproxen and truvada.  Renal US negative for obstruction. FeNa of 5 suggests ATN.  Lab Results  Component Value Date   CREATININE 5.14* 06/08/2011  currently stable - follow recommendations of renal   # Hyperkalemia: Resolved, s/p calcium gluconate, D50, insulin, sodium bicarb in the ED.  - stable s/p kayexalate x 1 on 5/13 Lab Results  Component Value Date   K 4.5 06/08/2011   # Metabolic Acidosis: likely secondary to renal failure. Resolved s/p isotonic bicarb fluids. Recurred 5/16 and given 50 meq bicarb x 2 and resolved - recurred 5/18, CO2 is 17; secondary to RTA, start oral HCO3 supplementation approximately 59.74 mEq/dose   # Anemia, Normocytic: Unclear etiology, but most likely anemia of chronic disease; However Hgb Dropped from 10.8  To 7.8 to 5.8, the 2 units PRBC on 5/17; hemolysis labs show elevated LDH and haptoglobin, so not clearly intravascular; No known source of active bleeding; Fe level normal and low TIBC so no microcytic component; B12 normal so no clear macrocytic component.    Lab Results  Component Value Date   HGB 9.2* 06/07/2011   - recheck this AM  # Hyponatremia: likely secondary to low volume and renal failure. Stable currently.   # Pulmonary Edema: No complaints of CP at this time.  CXR appears improved from previous and doesn't show any evidence of new  pneumonia.  Cardiac enzymes negative x 2. Pulm edema 2/2 volume overload on 5/17 - Pulm edema improved with lasix   # Thrombocytopenia: decrease since early May. No petechia or bruising on exam. INR 1.27 with mildly elevated PT at 16.2. PTT normal. Increased from 53 to 245.  - improving - continue to trend Lab Results  Component Value Date   PLT 245 06/07/2011   # HIV/AIDS: CD4 count of 20 on 05/22/11 and viral load 837K on 05/03/11. Repeat counts have mildly improved: viral load: 3667; CD4 count: 50 - continue current antiviral therapy per ID: abacavir, darunavir, lamivudine, ritonavir (zidovudine d/c'd on 5/17 for concern for myelosuppression; truvada d/c'd 5/11 for concern about AKI) - continue azithromycin for MAC prophylaxis - Continue atovaquone for PCP prophylaxis. Dapsone stopped.   # Malnutrition: low albumin and cachectic on exam, secondary to AIDS wasting  -  continue megace and mirtazapine  - continue ensure supplements  - regular diet  # Sacral Ulcer: seen by wound care who does not believe that it is decubitus but rather a sequelae of the HIV  - continue aquacel dressing and sitting on pillow   # Fen/GI:  - SLIV - Regular diet   # Prophylaxis:  - start lovenox 5/19 as plts much improved from baseline (53 to 245)   # Dispo: keep inpatient until clinical improvement     LOS: 9 days   Mat Carne MD 06/08/2011, 8:17 AM

## 2011-06-09 LAB — BASIC METABOLIC PANEL
BUN: 59 mg/dL — ABNORMAL HIGH (ref 6–23)
CO2: 19 mEq/L (ref 19–32)
Calcium: 8.9 mg/dL (ref 8.4–10.5)
Creatinine, Ser: 5.24 mg/dL — ABNORMAL HIGH (ref 0.50–1.35)
GFR calc Af Amer: 15 mL/min — ABNORMAL LOW (ref >90)

## 2011-06-09 LAB — CBC
MCHC: 34.2 g/dL (ref 30.0–36.0)
MCV: 89.4 fL (ref 78.0–100.0)
Platelets: 360 10*3/uL (ref 150–400)
RDW: 19.8 % — ABNORMAL HIGH (ref 11.5–15.5)
WBC: 3.3 10*3/uL — ABNORMAL LOW (ref 4.0–10.5)

## 2011-06-09 MED ORDER — PANTOPRAZOLE SODIUM 20 MG PO TBEC
20.0000 mg | DELAYED_RELEASE_TABLET | Freq: Every day | ORAL | Status: DC
  Administered 2011-06-10 – 2011-06-12 (×3): 20 mg via ORAL
  Filled 2011-06-09 (×4): qty 1

## 2011-06-09 MED ORDER — LAMIVUDINE 10 MG/ML PO SOLN
100.0000 mg | Freq: Every day | ORAL | Status: DC
  Administered 2011-06-09 – 2011-06-12 (×4): 100 mg via ORAL
  Filled 2011-06-09 (×4): qty 10

## 2011-06-09 MED ORDER — ENOXAPARIN SODIUM 30 MG/0.3ML ~~LOC~~ SOLN
30.0000 mg | SUBCUTANEOUS | Status: DC
  Administered 2011-06-09 – 2011-06-12 (×4): 30 mg via SUBCUTANEOUS
  Filled 2011-06-09 (×4): qty 0.3

## 2011-06-09 NOTE — Progress Notes (Signed)
Daily Progress Note Daniel House. [name], M.D., M.B.A  Family Medicine PGY-1 Pager [phone]  Subjective:  Overnight: no events  1. Chest Pain - persistent, central chest radiating to the back, occurs with cough, hiccup, deep breath 2. Breathing - Shortness of breath with ambulation, improved when lying down 3. Ab pain - relieved by carafate 4. Urination - still making urine  Objective: Vital signs in last 24 hours: Temp:  [97.9 F (36.6 C)-99.1 F (37.3 C)] 99.1 F (37.3 C) (05/20 0532) Pulse Rate:  [92-94] 94  (05/20 0532) Resp:  [18-20] 20  (05/20 0532) BP: (121-137)/(76-90) 136/82 mmHg (05/20 0532) SpO2:  [98 %] 98 % (05/20 0532) Weight:  [120 lb 5.9 oz (54.6 kg)-125 lb 14.1 oz (57.1 kg)] 120 lb 5.9 oz (54.6 kg) (05/20 9811) Weight change: -5 lb 13.1 oz (-2.639 kg) Last BM Date: 06/08/11  Filed Weights   06/08/11 9147 06/08/11 1254 06/09/11 0648  Weight: 131 lb 11.2 oz (59.739 kg) 125 lb 14.1 oz (57.1 kg) 120 lb 5.9 oz (54.6 kg)   Intake/Output from previous day: 05/19 0701 - 05/20 0700 In: 530 [P.O.:530] Out: 2300 [Urine:2300] Intake/Output this shift:   Physical Exam  Constitutional: middle age AAM; distressed, tremulous, cachectic, sitting on the edge of the bed eating breakfast  Cardiovascular: Regular rhythm and normal heart sounds. flow murmur heard. Tachycardic.  Respiratory: normal work of breathing. No rales He has no wheezes. He exhibits no tenderness.  GI: Soft. He exhibits no distension.  Musculoskeletal: Normal range of motion. He exhibits no edema and no tenderness.  Neurological: He is alert and oriented to person, place, and time. CN2-12 grossly intact. Lower Extremities: 1+ pitting edema proximal to the knee; 2+ between ankle and knees; 3+ pitting edema of feet; negative homan's sign bilaterally  Lab Results:  Basename 06/09/11 0630 06/08/11 0904  WBC 3.3* 3.7*  HGB 7.8* 8.4*  HCT 22.8* 24.7*  PLT 360 314   BMET  Basename 06/09/11 0630  06/08/11 0547  NA 135 133*  K 4.2 4.5  CL 104 104  CO2 19 17*  GLUCOSE 93 95  BUN 59* 61*  CREATININE 5.24* 5.14*  CALCIUM 8.9 8.5    Studies/Results: Lower Extremity Dopplers: no DVT   Medications:  Scheduled:    . abacavir  600 mg Oral Daily  . atovaquone  1,500 mg Oral Daily  . azithromycin  1,200 mg Oral Q7 days  . darbepoetin (ARANESP) injection - NON-DIALYSIS  100 mcg Subcutaneous Q Mon-1800  . darunavir  800 mg Oral Q breakfast  . enoxaparin (LOVENOX) injection  40 mg Subcutaneous Q24H  . feeding supplement  237 mL Oral BID BM  . fluconazole  100 mg Oral Weekly  . furosemide  80 mg Intravenous TID  . lamiVUDine  150 mg Oral Daily  . lidocaine  1 patch Transdermal Q24H  . megestrol  400 mg Oral Daily  . mirtazapine  15 mg Oral QHS  . mulitivitamin with minerals  1 tablet Oral Daily  . ritonavir  100 mg Oral Q breakfast  . sucralfate  1 g Oral TID WC & HS  . DISCONTD: ferrous sulfate  325 mg Oral TID WC  . DISCONTD: sodium bicarbonate  650 mg Oral BID   WGN:[id number], acetaminophen, albuterol, calcium carbonate, promethazine, traMADol  Assessment/Plan: 36 yo male with h/o newly diagnosed HIV/AIDS (CD4 count of 20 on 05/22/11) who presents with worsening shortness of breath, weakness and intermittent chest pain and who was admitted for hyperkalemia, acute renal  failure, metabolic acidosis, and now pancytopenia with hemolysis s/p 2 units of prbc now stable.   # Chest Pain - Acute onset 5/18, differential diagnosis includes MI, PE, pericarditis, MSK pain, GERD, pleuritis pain from known pleural effusion or candidal esophagitis; MI ruled out: troponin elevated, but in setting of renal failure not as accurate; EKG showed NSR x 2; PE - not likely 2/2 negative dopplers for DVT; possibly a pericarditis, but cannot take NSAIDS 2/2 to kidney function - This bothersome to the patient, but I cannot link this with his other problems of hgb loss or AKI, so no work-up today    # Acute renal failure: likely secondary to combination of poor oral intake and low volume status as well as recent use of bactrim, naproxen and truvada.  Renal US negative for obstruction. FeNa of 5 suggests ATN.  Lab Results  Component Value Date   CREATININE 5.24* 06/09/2011   - follow recommendations of renal: continue Lasix 80 mg TID for goal urine output of 3L (2.3 yesterday)  # Hyperkalemia: Resolved, s/p calcium gluconate, D50, insulin, sodium bicarb in the ED.  - stable s/p kayexalate x 1 on 5/13  # Metabolic Acidosis: likely secondary to renal failure. Resolved s/p isotonic bicarb fluids. Recurred 5/16 and given 50 meq bicarb x 2 and resolved - CO2 19 today, NaHCO3 d/c's by renal so I will wait for the renal team to dose as needed    # Anemia, Normocytic: Unclear etiology, but most likely anemia of chronic disease; However Hgb Dropped from 10.8  To 7.8 to 5.8, the 2 units PRBC on 5/17; hemolysis labs show elevated LDH and haptoglobin, so not clearly intravascular; No known source of active bleeding and GI bleed unlikely if FOB negative; Fe level normal and low TIBC so no microcytic component; B12 normal so no clear macrocytic component.    Lab Results  Component Value Date   HGB 7.8* 06/09/2011   - trend and transfuse @ 7.0 or lower  # Hyponatremia: likely secondary to low volume and renal failure. Stable currently.   # Pulmonary Edema: No complaints of CP at this time.  CXR appears improved from previous and doesn't show any evidence of new pneumonia.  Cardiac enzymes negative x 2. Pulm edema 2/2 volume overload on 5/17 - resolved with Lasix    # Thrombocytopenia: decrease since early May. No petechia or bruising on exam. INR 1.27 with mildly elevated PT at 16.2. PTT normal. Increased from 53 to 245.  - resolved  # HIV/AIDS: CD4 count of 20 on 05/22/11 and viral load 837K on 05/03/11. Repeat counts have mildly improved: viral load: 3667; CD4 count: 50 - continue current  antiviral therapy per ID: abacavir, darunavir, lamivudine, ritonavir (zidovudine d/c'd on 5/17 for concern for myelosuppression; truvada d/c'd 5/11 for concern about AKI) - continue azithromycin for MAC prophylaxis - Continue atovaquone for PCP prophylaxis (Dapsone stopped for concern for hemolytic anemia)  # Malnutrition: low albumin and cachectic on exam, secondary to AIDS wasting  - continue megace and mirtazapine  - continue ensure supplements  - regular diet  # Sacral Ulcer: seen by wound care who does not believe that it is decubitus but rather a sequelae of the HIV  - continue aquacel dressing and sitting on pillow   # Fen/GI:  - SLIV - Regular diet   # Prophylaxis:  - Lovenox - Add PPI which may help dypepsia   # Dispo: keep inpatient until clinical improvement     LOS:  10 days   Mat Carne MD 06/09/2011, 8:15 AM

## 2011-06-09 NOTE — Progress Notes (Signed)
Family Medicine Teaching Service Attending Note  I interviewed and examined patient Daniel House and reviewed their tests and x-rays.  I discussed with Dr. [name] and reviewed their note for today.  I agree with their assessment and plan.     Additionally  Appreciate Renal Consult Anemia and Crt about the same Poor appetite -"will not let me have what I want to eat"  Edema - consider compression stockings to mobilize his leg edema Consider liberalizing his diet if not getting in enough calories

## 2011-06-09 NOTE — Progress Notes (Addendum)
[facility] - PROGRESS NOTE Resident Note   Please see below for attending addendum to resident note.  Subjective:   He reports doing better and that his chest pain has subsided.  Urine output was 2.3L yesterday after we increased Lasix IV 80mg  TID.  BMP pending this AM. Cr in the past 3 days have been around 5.  Weight is trending down from 125 to 120 this morning   Objective:    Vital Signs:   Temp:  [97.9 F (36.6 C)-99.1 F (37.3 C)] 99.1 F (37.3 C) (05/20 0532) Pulse Rate:  [92-94] 94  (05/20 0532) Resp:  [18-20] 20  (05/20 0532) BP: (121-137)/(76-90) 136/82 mmHg (05/20 0532) SpO2:  [98 %] 98 % (05/20 0532) Weight:  [120 lb 5.9 oz (54.6 kg)-125 lb 14.1 oz (57.1 kg)] 120 lb 5.9 oz (54.6 kg) (05/20 0648) Last BM Date: 06/08/11  24-hour weight change: Weight change: -5 lb 13.1 oz (-2.639 kg)  Weight trends: Filed Weights   06/08/11 0613 06/08/11 1254 06/09/11 0648  Weight: 131 lb 11.2 oz (59.739 kg) 125 lb 14.1 oz (57.1 kg) 120 lb 5.9 oz (54.6 kg)  (? Validity of weights)  Intake/Output:  05/19 0701 - 05/20 0700 In: 530 [P.O.:530] Out: 2300 [Urine:2300]  Physical Exam:  General:  Vital signs reviewed and noted. Thin appearing, in no acute distress; alert, appropriate and cooperative throughout examination. No JVD  Lungs:  Normal respiratory effort. Clear to auscultation BL without crackles or wheezes.   Heart:  RRR. S1 and S2 normal without gallop, +systolic murmur, or rubs.   Abdomen:  BS normoactive. Soft, Nondistended, non-tender.   Extremities:  +1-2 pitting edema on LE more on ankle and feet, improved from yesterday.       Labs: Basic Metabolic Panel:  Lab 06/09/11 1610 06/08/11 0547 06/07/11 0457 06/06/11 0640 06/05/11 0635  NA 135 133* 133* 133* 137  K 4.2 4.5 4.9 4.1 4.3  CL 104 104 107 104 109  CO2 19 17* 18* 19 18*  GLUCOSE 93 95 89 94 93  BUN 59* 61* 63* 65* 67*  CREATININE 5.24* 5.14* 5.04* 5.18* 5.06*  CALCIUM 8.9 8.5 8.7 -- --   MG -- -- -- -- --  PHOS -- -- -- 3.4 2.9    Liver Function Tests:  Lab 06/06/11 0640 06/05/11 0635  AST -- --  ALT -- --  ALKPHOS -- --  BILITOT -- --  PROT -- --  ALBUMIN 1.8* 1.7*   CBC:  Lab 06/08/11 0904 06/07/11 0457 06/06/11 1659 06/06/11 1658 06/06/11 0644 06/04/11 0930 06/03/11 0800  WBC 3.7* 3.8* -- -- 4.1 4.4 5.3  NEUTROABS -- -- -- -- -- -- --  HGB 8.4* 9.2* 9.2* 9.5* 5.8* -- --  HCT 24.7* 27.4* 27.4* -- 18.2* 25.0* --  MCV 89.5 89.0 -- -- 95.3 98.0 92.3  PLT 314 245 -- -- 210 105* 94*    Cardiac Enzymes:  Lab 06/08/11 0547 06/08/11 0209 06/07/11 1832  CKTOTAL 44 57 57  CKMB 3.1 3.5 3.7  CKMBINDEX -- -- --  TROPONINI 0.46* 0.71* 0.57*   Microbiology: Results for orders placed during the hospital encounter of 05/30/11  MRSA PCR SCREENING     Status: Normal   Collection Time   05/31/11  5:09 AM      Component Value Range Status Comment   MRSA by PCR NEGATIVE  NEGATIVE  Final      Medications:    Infusions:    . sodium chloride  20 mL/hr at 06/05/11 1800    Scheduled Medications:    . abacavir  600 mg Oral Daily  . atovaquone  1,500 mg Oral Daily  . azithromycin  1,200 mg Oral Q7 days  . darbepoetin (ARANESP) injection - NON-DIALYSIS  100 mcg Subcutaneous Q Mon-1800  . darunavir  800 mg Oral Q breakfast  . enoxaparin (LOVENOX) injection  40 mg Subcutaneous Q24H  . feeding supplement  237 mL Oral BID BM  . fluconazole  100 mg Oral Weekly  . furosemide  80 mg Intravenous TID  . lamiVUDine  150 mg Oral Daily  . lidocaine  1 patch Transdermal Q24H  . megestrol  400 mg Oral Daily  . mirtazapine  15 mg Oral QHS  . mulitivitamin with minerals  1 tablet Oral Daily  . ritonavir  100 mg Oral Q breakfast  . sucralfate  1 g Oral TID WC & HS  . DISCONTD: ferrous sulfate  325 mg Oral TID WC  . DISCONTD: sodium bicarbonate  650 mg Oral BID    PRN Medications: acetaminophen, acetaminophen, albuterol, calcium carbonate, promethazine,  traMADol   Assessment/ Plan:    1. Acute renal failure with hyperkalemia: likely ATN 2/2 Bactrim/Tenofovir/nonsteroidal anti-inflammatory drug therapy/Truvada. Renal function stabilizes at ~5 in the past 3 days. BMP pending this AM.   Urine output improve to 2.3L after Lasix 80mg  IV TID, and our goal is about 3L per day.  His weight has been fluctuating from 95-->123-->105-->131-->125-->120 pounds.  Continue diuresis with IV Lasix today 80mg  tid. We are hopeful that his kidney function will recovery. No indication for dialysis at this time. Repeat renal panel in AM. (baseline creatinine around 1 on May 22, 2011; ANA and ANCA negative, normal ACE level,about 885 mg proteinuria; creatinine on May 10 3.9, peaked at and is around 5.2 - not getting any better yet) 2. Dyspnea- due to pulm edema, see 5/16 cxr. Improving with IV Lasix Lungs are fairly clear and most of his edema is pretibial - with low albumin will just have to be careful not to dry out intravascularly - would keep lasix where it is for now 3. Hyponatremia: Likely hypervolemic hyponatremia. stable at 133. Continue diuresis 4. Pneumocystis pneumonia: TMP/SMX stopped and patient was switched to Dapsone (due to concerns with hyperkalemia/acute renal failure). Continue dapsone.  5. HIV/AIDS: Anti-retroviral therapy adjustments per infectious disease: Prezista, Diflucan, Lamivudine, Norvir, and Abacavir.  6. Anemia- chronic disease likely due to HIV vs. Hemolysis; although is haptoglobin is elevated and G6PD is normal; LDH is elevated at 481. Hb is trending down to 8.4-->7.8. Anemia panel shows Ferritin 2453, iron 56, sat ratio 38, folate 12.5, and vitamin B12 304. Continue aranesp.  7. Metabolic acidosis: AG of 12 and serum bicarb 17 .  8. Chest pain: resolved. likely GERD/MSK. Continue sucralfate. LE Venous Doppler negative for DVT 9. Dispo- pending clinical improvement  Length of Stay: 10 days  Patient history and plan of care reviewed with  attending, Dr. Eliott Nine.   Daniel House, Internal Medicine Resident 06/09/2011, 7:01 AM I have seen and examined this patient and agree with plan  As outlined above with highlighted additions. Shannan Slinker B,MD 06/09/2011 12:39 PM

## 2011-06-09 NOTE — Progress Notes (Signed)
Physical Therapy Treatment Patient Details Name: Daniel House MRN: [id number] DOB: 1975/08/14 Today's Date: 06/09/2011 Time: 4098-1191 PT Time Calculation (min): 18 min  PT Assessment / Plan / Recommendation Comments on Treatment Session  Again mostly limited by fatigue. Encouraged pt to ambulate short but frequent walks through the day with nursing staff to improve his activity tolerance. Encouraged HEP (pt given heel/toe raises seated and LAQ).     Follow Up Recommendations  Home health PT;Supervision - Intermittent (vs no PT follow up)    Barriers to Discharge        Equipment Recommendations  None recommended by PT    Recommendations for Other Services    Frequency Min 3X/week   Plan Discharge plan remains appropriate;Frequency remains appropriate    Precautions / Restrictions Precautions Precautions: Fall Precaution Comments: weak, decreased activity tolerance       Mobility  Bed Mobility Supine to Sit: 7: Independent Sitting - Scoot to Edge of Bed: 7: Independent Transfers Sit to Stand: 6: Modified independent (Device/Increase time);With upper extremity assist;From bed Stand to Sit:  (see below) Details for Transfer Assistance: pt sat on toilet with door shut and not observed but reports no difficulty (nursing tech present); pt then back in chair when I got back to his room so did not observe stand->sit Ambulation/Gait Ambulation/Gait Assistance: 5: Supervision Ambulation Distance (Feet): 200 Feet Assistive device: None Ambulation/Gait Assistance Details: staggered, slow gait; when encouraged to increase his gait speed his stability improved (less lateral sway); increased stagger with head turns, easily fatigued and out of breath    Exercises  Ankle pumps x20 supine LAQ x5 seated Heel/toe raises x5 seated     PT Goals Acute Rehab PT Goals PT Goal: Supine/Side to Sit - Progress: Met PT Goal: Sit at Edge Of Bed - Progress: Met PT Goal: Sit to Stand - Progress:  Met PT Transfer Goal: Bed to Chair/Chair to Bed - Progress: Progressing toward goal PT Goal: Ambulate - Progress: Progressing toward goal  Visit Information  Last PT Received On: 06/09/11 Assistance Needed: +1    Subjective Data  Subjective: My feet are swollen. You need to put my socks on for me because I can't bend down.    Cognition  Overall Cognitive Status: Appears within functional limits for tasks assessed/performed Arousal/Alertness: Awake/alert Orientation Level: Appears intact for tasks assessed Behavior During Session: Flat affect    Balance  High Level Balance High Level Balance Comments: head turns cause increase stagger and slowing down, pt able to perform gait pivot turns but with increased time so as not to lose his balance  End of Session PT - End of Session Equipment Utilized During Treatment: Gait belt Activity Tolerance: Patient limited by fatigue Patient left: in chair;with call bell/phone within reach    [facility] HELEN 06/09/2011, 12:43 PM

## 2011-06-09 NOTE — Progress Notes (Addendum)
Nutrition Follow-up/Consult  Diet Order:  Renal 80/90-2-2 with 1200 ml fluid restriction  Supplements:  Ensure Complete BID  PO's:  80-0-100% meal completion; does not drink Ensure Complete or any other supplements--"I can't keep them down."  Meds: Scheduled Meds:   . abacavir  600 mg Oral Daily  . atovaquone  1,500 mg Oral Daily  . azithromycin  1,200 mg Oral Q7 days  . darbepoetin (ARANESP) injection - NON-DIALYSIS  100 mcg Subcutaneous Q Mon-1800  . darunavir  800 mg Oral Q breakfast  . enoxaparin (LOVENOX) injection  30 mg Subcutaneous Q24H  . feeding supplement  237 mL Oral BID BM  . fluconazole  100 mg Oral Weekly  . furosemide  80 mg Intravenous TID  . lamiVUDine  100 mg Oral Daily  . lidocaine  1 patch Transdermal Q24H  . megestrol  400 mg Oral Daily  . mirtazapine  15 mg Oral QHS  . mulitivitamin with minerals  1 tablet Oral Daily  . pantoprazole  20 mg Oral Q1200  . ritonavir  100 mg Oral Q breakfast  . DISCONTD: enoxaparin (LOVENOX) injection  40 mg Subcutaneous Q24H  . DISCONTD: lamiVUDine  150 mg Oral Daily  . DISCONTD: sucralfate  1 g Oral TID WC & HS   Continuous Infusions:   . sodium chloride 20 mL/hr at 06/05/11 1800   PRN Meds:.acetaminophen, acetaminophen, albuterol, calcium carbonate, promethazine, traMADol  Labs:  CMP     Component Value Date/Time   NA 135 06/09/2011 0630   K 4.2 06/09/2011 0630   CL 104 06/09/2011 0630   CO2 19 06/09/2011 0630   GLUCOSE 93 06/09/2011 0630   BUN 59* 06/09/2011 0630   CREATININE 5.24* 06/09/2011 0630   CREATININE 1.03 05/22/2011 1451   CALCIUM 8.9 06/09/2011 0630   PROT 6.8 05/30/2011 2319   ALBUMIN 1.8* 06/06/2011 0640   AST 14 05/30/2011 2319   ALT 21 05/30/2011 2319   ALKPHOS 124* 05/30/2011 2319   BILITOT 0.4 05/30/2011 2319   GFRNONAA 13* 06/09/2011 0630   GFRAA 15* 06/09/2011 0630     Intake/Output Summary (Last 24 hours) at 06/09/11 1623 Last data filed at 06/09/11 1500  Gross per 24 hour  Intake    760 ml    Output   2800 ml  Net  -2040 ml    Weight Status:  54.6 kg (down from 56.11 kg on 5/16)  Re-estimated needs:  1800-2000 kcals, 80-90 grams protein daily  Nutrition Dx:  Inadequate oral intake, ongoing.  Goal:  Oral intake to meet >90% of estimated nutrition needs, progressing.  Patient complains that he can't taste any of the food he is sent on his tray.  Requests more sweets and salty foods, which he is not allowed on a renal diet.  Patient is receiving Megace to stimulate appetite, however, he is unhappy with his meal options.  Do not feel that patient needs renal restricted diet (low sodium, low phosphorus, low potassium).  Potassium is slightly elevated and phosphorous is WNL.  Intervention:    Given severe PCM with poor intake of meals and BMI<18.5, recommend liberalize diet to regular to help improve oral intake.  Will send snacks TID between meals to help improve intake.  Discontinue Ensure Complete BID since patient will not drink it.  Monitor:  Weight trend, labs, PO intake.   Hettie Holstein Pager #:  [phone]

## 2011-06-10 DIAGNOSIS — B20 Human immunodeficiency virus [HIV] disease: Secondary | ICD-10-CM

## 2011-06-10 LAB — CBC
Hemoglobin: 7.9 g/dL — ABNORMAL LOW (ref 13.0–17.0)
MCH: 30.9 pg (ref 26.0–34.0)
MCHC: 34.2 g/dL (ref 30.0–36.0)
Platelets: 405 10*3/uL — ABNORMAL HIGH (ref 150–400)

## 2011-06-10 LAB — BASIC METABOLIC PANEL
Calcium: 9 mg/dL (ref 8.4–10.5)
GFR calc Af Amer: 15 mL/min — ABNORMAL LOW (ref >90)
GFR calc non Af Amer: 13 mL/min — ABNORMAL LOW (ref >90)
Glucose, Bld: 97 mg/dL (ref 70–99)
Potassium: 4 mEq/L (ref 3.5–5.1)
Sodium: 135 mEq/L (ref 135–145)

## 2011-06-10 MED ORDER — FUROSEMIDE 80 MG PO TABS
160.0000 mg | ORAL_TABLET | Freq: Two times a day (BID) | ORAL | Status: DC
  Administered 2011-06-10 – 2011-06-12 (×4): 160 mg via ORAL
  Filled 2011-06-10 (×6): qty 2

## 2011-06-10 MED ORDER — FUROSEMIDE 10 MG/ML IJ SOLN: 80.0000 mg | Freq: Two times a day (BID) | INTRAMUSCULAR | Status: DC

## 2011-06-10 NOTE — Progress Notes (Addendum)
[facility] - PROGRESS NOTE Resident Note   Please see below for attending addendum to resident note.  Subjective:   Great urine output of 3800cc in past 24 hours. He reports doing "ok" this AM, denies any chest pain or SOB.  Unfortunately, Cr has not turned the corner yet and is up slightly today 5.14-->5.24-->5.27. His weight continues to fluctuate, question accuracy since he did put out almost 4L yesterday but weight is up 2 pounds.   Objective:    Vital Signs:   Temp:  [98.4 F (36.9 C)-99.2 F (37.3 C)] 98.4 F (36.9 C) (05/21 0619) Pulse Rate:  [80-101] 87  (05/21 0619) Resp:  [20] 20  (05/21 0619) BP: (130-139)/(83-85) 139/83 mmHg (05/21 0619) SpO2:  [99 %-100 %] 100 % (05/21 0619) Weight:  [122 lb 12.7 oz (55.7 kg)] 122 lb 12.7 oz (55.7 kg) (05/21 0619) Last BM Date: 06/08/11  24-hour weight change: Weight change: -3 lb 1.4 oz (-1.4 kg)  Weight trends: Filed Weights   06/08/11 1254 06/09/11 0648 06/10/11 0619  Weight: 125 lb 14.1 oz (57.1 kg) 120 lb 5.9 oz (54.6 kg) 122 lb 12.7 oz (55.7 kg)    Intake/Output:  05/20 0701 - 05/21 0700 In: 470 [P.O.:470] Out: 3800 [Urine:3800]  Physical Exam:  General:  Vital signs reviewed and noted. Thin appearing, in no acute distress; alert, appropriate and cooperative throughout examination. No JVD   Lungs:  Normal respiratory effort. Clear to auscultation BL without crackles or wheezes.   Heart:  RRR. S1 and S2 normal without gallop, +systolic murmur, or rubs.   Abdomen:  BS normoactive. Soft, flat, Nondistended, non-tender.   Extremities:  +1-2 pitting edema on LE more on ankle and feet up to mid knees, improved from yesterday.    Labs: Basic Metabolic Panel:  Lab 06/10/11 4098 06/09/11 0630 06/08/11 0547 06/07/11 0457 06/06/11 0640 06/05/11 0635  NA 135 135 133* 133* 133* --  K 4.0 4.2 4.5 4.9 4.1 --  CL 103 104 104 107 104 --  CO2 20 19 17* 18* 19 --  GLUCOSE 97 93 95 89 94 --  BUN 57* 59* 61* 63* 65*  --  CREATININE 5.27* 5.24* 5.14* 5.04* 5.18* --  CALCIUM 9.0 8.9 8.5 -- -- --  MG -- -- -- -- -- --  PHOS -- -- -- -- 3.4 2.9  CBC:  Lab 06/10/11 0609 06/09/11 0630 06/08/11 0904 06/07/11 0457 06/06/11 1659 06/06/11 0644  WBC 3.0* 3.3* 3.7* 3.8* -- 4.1  NEUTROABS -- -- -- -- -- --  HGB 7.9* 7.8* 8.4* 9.2* 9.2* --  HCT 23.1* 22.8* 24.7* 27.4* 27.4* --  MCV 90.2 89.4 89.5 89.0 -- 95.3  PLT 405* 360 314 245 -- 210   Cardiac Enzymes:  Lab 06/08/11 0547 06/08/11 0209 06/07/11 1832  CKTOTAL 44 57 57  CKMB 3.1 3.5 3.7  CKMBINDEX -- -- --  TROPONINI 0.46* 0.71* 0.57*   Microbiology: Results for orders placed during the hospital encounter of 05/30/11  MRSA PCR SCREENING     Status: Normal   Collection Time   05/31/11  5:09 AM      Component Value Range Status Comment   MRSA by PCR NEGATIVE  NEGATIVE  Final       Medications:    Infusions:    . sodium chloride 20 mL/hr at 06/05/11 1800    Scheduled Medications:    . abacavir  600 mg Oral Daily  . atovaquone  1,500 mg Oral Daily  .  azithromycin  1,200 mg Oral Q7 days  . darbepoetin (ARANESP) injection - NON-DIALYSIS  100 mcg Subcutaneous Q Mon-1800  . darunavir  800 mg Oral Q breakfast  . enoxaparin (LOVENOX) injection  30 mg Subcutaneous Q24H  . fluconazole  100 mg Oral Weekly  . furosemide  80 mg Intravenous TID  . lamiVUDine  100 mg Oral Daily  . lidocaine  1 patch Transdermal Q24H  . megestrol  400 mg Oral Daily  . mirtazapine  15 mg Oral QHS  . mulitivitamin with minerals  1 tablet Oral Daily  . pantoprazole  20 mg Oral Q1200  . ritonavir  100 mg Oral Q breakfast  . DISCONTD: enoxaparin (LOVENOX) injection  40 mg Subcutaneous Q24H  . DISCONTD: feeding supplement  237 mL Oral BID BM  . DISCONTD: lamiVUDine  150 mg Oral Daily  . DISCONTD: sucralfate  1 g Oral TID WC & HS    PRN Medications: acetaminophen, acetaminophen, albuterol, calcium carbonate, promethazine, traMADol   Assessment/ Plan:   1. Acute  renal failure with hyperkalemia: likely ATN 2/2 Bactrim/Tenofovir/nonsteroidal anti-inflammatory drug therapy/Truvada. (Baseline creatinine around 1 on May 22, 2011; ANA and ANCA negative, normal ACE level,about 885 mg proteinuria; creatinine on May 10 was 3.9). Cr this AM is 5.27 which is slightly up from 5.24.  Great urine output of 3.8L yesterday. His weight has been fluctuating from 95-->123-->105-->131-->125-->120-->122 pounds. I asked nurse to repeat his weight. I think we could back off a little on the lasix and change to po - would recommend 160 po bid. 2. Dyspnea- due to pulm edema, see 5/16 cxr. Asymptomatic at this time Resolved by exam 3. Hyponatremia: Resolved. Na 135 4. Pneumocystis pneumonia: TMP/SMX stopped and patient was switched to Dapsone (due to concerns with hyperkalemia/acute renal failure). Continue dapsone.  5. HIV/AIDS: Anti-retroviral therapy adjustments per infectious disease: Prezista, Diflucan, Lamivudine, Norvir, and Abacavir.  6. Anemia- chronic disease likely due to HIV vs. Hemolysis; although is haptoglobin is elevated and G6PD is normal; LDH is elevated at 481. Hb trends 8.4-->7.8-->7.9 Anemia panel shows Ferritin 2453, iron 56, sat ratio 38, folate 12.5, and vitamin B12 304. Continue aranesp.  7. Metabolic acidosis: resolved. Bicarb 20 8. Chest pain: resolved. likely GERD/MSK. Consider discontinue sucralfate. LE Venous Doppler negative for DVT. Slightly elevated Tro likely due to kidney failure. 9. Dispo- pending clinical improvement 10. Malnutrition - with potassium of 4 and lowish phos could liberalize diet some to increase food choices  Length of Stay: 11 days  Patient history and plan of care reviewed with attending, Dr. Wardell Honour, Internal Medicine Resident 06/10/2011, 7:41 AM I have seen and examined this patient and agree with plan as outlined above with highlighted additions.  Excellent urine output but GFR not improved yet.  Feel could  transition to po lasix. See above.  Liberalize diet.  Will follow.  Camille Bal B,MD 06/10/2011 12:46 PM

## 2011-06-10 NOTE — Progress Notes (Signed)
RN to bedside requesting patient to ambulate in hallway, patient stated that he had walked to the shower earlier to bathe and wanted to rest at this time and would let us know when he was ready to walk.  06/10/2011 Lacie Scotts D

## 2011-06-10 NOTE — Progress Notes (Signed)
Physical Therapy Treatment Patient Details Name: Daniel House MRN: [id number] DOB: [birth year] Today's Date: 06/10/2011 Time: 1550-1600 PT Time Calculation (min): 10 min  PT Assessment / Plan / Recommendation Comments on Treatment Session  Pt making progress. Encouraged pt to walk more during the day with staff.    Follow Up Recommendations  Home health PT;Supervision - Intermittent    Barriers to Discharge        Equipment Recommendations  None recommended by PT    Recommendations for Other Services    Frequency Min 3X/week   Plan Discharge plan remains appropriate;Frequency remains appropriate    Precautions / Restrictions Precautions Precautions: Fall   Pertinent Vitals/Pain N/A    Mobility  Bed Mobility Supine to Sit: 7: Independent Sitting - Scoot to Edge of Bed: 7: Independent Transfers Sit to Stand: 6: Modified independent (Device/Increase time);With upper extremity assist;From bed Stand to Sit: 6: Modified independent (Device/Increase time);To bed;With upper extremity assist Ambulation/Gait Ambulation/Gait Assistance: 5: Supervision Ambulation Distance (Feet): 225 Feet Assistive device: None Gait velocity: 2.20 ft/sec    Exercises     PT Diagnosis:    PT Problem List:   PT Treatment Interventions:     PT Goals Acute Rehab PT Goals PT Goal: Stand to Sit - Progress: Met PT Goal: Ambulate - Progress: Progressing toward goal  Visit Information  Last PT Received On: 06/10/11 Assistance Needed: +1    Subjective Data  Subjective: Pt reports he feels like he is getting stronger.   Cognition  Overall Cognitive Status: Appears within functional limits for tasks assessed/performed Arousal/Alertness: Awake/alert Orientation Level: Appears intact for tasks assessed Behavior During Session: Flat affect    Balance  Static Standing Balance Static Standing - Balance Support: No upper extremity supported Static Standing - Level of Assistance: 5: Stand by  assistance  End of Session PT - End of Session Equipment Utilized During Treatment: Gait belt Activity Tolerance: Patient tolerated treatment well Patient left: in bed Nurse Communication: Mobility status    Kadince Boxley 06/10/2011, 4:35 PM  [facility] PT [phone]

## 2011-06-10 NOTE — Progress Notes (Signed)
Daily Progress Note [name]. [name], M.D., M.B.A  Family Medicine PGY-1 Pager [phone]  Subjective:  Overnight: no events  Mr. Aguinaldo was lying in bed and appeared very relaxed this AM.  1. Chest Pain - none this AM 2. Breathing - none at rest 4. Urination - 3800 mL out last night   Mr Fariss agreed to walk the halls 3x today with assistance.   Objective: Vital signs in last 24 hours: Temp:  [98.4 F (36.9 C)-99.2 F (37.3 C)] 98.4 F (36.9 C) (05/21 8413) Pulse Rate:  [80-101] 87  (05/21 0619) Resp:  [20] 20  (05/21 0619) BP: (130-139)/(83-85) 139/83 mmHg (05/21 0619) SpO2:  [99 %-100 %] 100 % (05/21 0619) Weight:  [113 lb 12.1 oz (51.6 kg)-122 lb 12.7 oz (55.7 kg)] 113 lb 12.1 oz (51.6 kg) (05/21 0930) Weight change: -3 lb 1.4 oz (-1.4 kg) Last BM Date: 06/09/11  Filed Weights   06/09/11 0648 06/10/11 0619 06/10/11 0930  Weight: 120 lb 5.9 oz (54.6 kg) 122 lb 12.7 oz (55.7 kg) 113 lb 12.1 oz (51.6 kg)   Intake/Output from previous day: 05/20 0701 - 05/21 0700 In: 470 [P.O.:470] Out: 3800 [Urine:3800] Intake/Output this shift: Total I/O In: 120 [P.O.:120] Out: -  Physical Exam  Constitutional: middle age AAM; non-distressed cachectic, very relaxed and pleasant   Cardiovascular: Regular rhythm and normal heart sounds. flow murmur heard. Tachycardic.  Respiratory: normal work of breathing. No rales He has no wheezes. He exhibits no tenderness.  GI: Soft. He exhibits no distension.  Musculoskeletal: Normal range of motion. He exhibits no edema and no tenderness.  Neurological: He is alert and oriented to person, place, and time. CN2-12 grossly intact. Lower Extremities: 1+ pitting edema proximal to the knee; 2+ between ankle and knees; 3+ pitting edema of feet; negative homan's sign bilaterally  Lab Results:  Basename 06/10/11 0609 06/09/11 0630  WBC 3.0* 3.3*  HGB 7.9* 7.8*  HCT 23.1* 22.8*  PLT 405* 360   BMET  Basename 06/10/11 0609 06/09/11 0630    NA 135 135  K 4.0 4.2  CL 103 104  CO2 20 19  GLUCOSE 97 93  BUN 57* 59*  CREATININE 5.27* 5.24*  CALCIUM 9.0 8.9    Studies/Results: Lower Extremity Dopplers: no DVT   Medications:  Scheduled:    . abacavir  600 mg Oral Daily  . atovaquone  1,500 mg Oral Daily  . azithromycin  1,200 mg Oral Q7 days  . darbepoetin (ARANESP) injection - NON-DIALYSIS  100 mcg Subcutaneous Q Mon-1800  . darunavir  800 mg Oral Q breakfast  . enoxaparin (LOVENOX) injection  30 mg Subcutaneous Q24H  . fluconazole  100 mg Oral Weekly  . furosemide  80 mg Intravenous TID  . lamiVUDine  100 mg Oral Daily  . lidocaine  1 patch Transdermal Q24H  . megestrol  400 mg Oral Daily  . mirtazapine  15 mg Oral QHS  . mulitivitamin with minerals  1 tablet Oral Daily  . pantoprazole  20 mg Oral Q1200  . ritonavir  100 mg Oral Q breakfast  . DISCONTD: feeding supplement  237 mL Oral BID BM  . DISCONTD: sucralfate  1 g Oral TID WC & HS   KGM:[id number], acetaminophen, albuterol, calcium carbonate, promethazine, traMADol  Assessment/Plan: 36 yo male with h/o newly diagnosed HIV/AIDS (CD4 count of 20 on 05/22/11) who presents with worsening shortness of breath, weakness and intermittent chest pain and who was admitted for hyperkalemia, acute renal failure, metabolic  acidosis, and now pancytopenia with hemolysis s/p 2 units of prbc now stable.   # Chest Pain - Acute onset 5/18, differential diagnosis includes MI, PE, pericarditis, MSK pain, GERD, pleuritis pain from known pleural effusion or candidal esophagitis; MI ruled out: troponin elevated, but in setting of renal failure not as accurate; EKG showed NSR x 2; PE - not likely 2/2 negative dopplers for DVT; possibly a pericarditis, but cannot take NSAIDS 2/2 to kidney function - Improved today, continue to folow  # Acute renal failure: likely secondary to combination of poor oral intake and low volume status as well as recent use of bactrim, naproxen and  truvada.  Renal US negative for obstruction. FeNa of 5 suggests ATN.  Lab Results  Component Value Date   CREATININE 5.27* 06/10/2011   - follow recommendations of renal: continue Lasix 80 mg TID for goal urine output of 3L (3.8 L out yesterday, but recorded weight increased, so not accurate. Edema of LE is markedly improved, so diuresis effective)  # Hyperkalemia: Resolved, s/p calcium gluconate, D50, insulin, sodium bicarb in the ED.  - stable s/p kayexalate x 1 on 5/13  # Metabolic Acidosis: likely secondary to renal failure. Resolved s/p isotonic bicarb fluids. Recurred 5/16 and given 50 meq bicarb x 2 and resolved - resolved    # Anemia, Normocytic: Unclear etiology, but most likely anemia of chronic disease; However Hgb Dropped from 10.8  To 7.8 to 5.8, the 2 units PRBC on 5/17; hemolysis labs show elevated LDH and haptoglobin, so not clearly intravascular; No known source of active bleeding and GI bleed unlikely if FOB negative; Fe level normal and low TIBC so no microcytic component; B12 normal so no clear macrocytic component.    Lab Results  Component Value Date   HGB 7.9* 06/10/2011   - stable, trend and transfuse @ 7.0 or lower  # Hyponatremia: likely secondary to low volume and renal failure. Stable currently.   # Pulmonary Edema: No complaints of CP at this time.  CXR appears improved from previous and doesn't show any evidence of new pneumonia.  Cardiac enzymes negative x 2. Pulm edema 2/2 volume overload on 5/17 - resolved with Lasix    # Thrombocytopenia: decrease since early May. No petechia or bruising on exam. INR 1.27 with mildly elevated PT at 16.2. PTT normal. Increased from 53 to 245.  - resolved  # HIV/AIDS: CD4 count of 20 on 05/22/11 and viral load 837K on 05/03/11. Repeat counts have mildly improved: viral load: 3667; CD4 count: 50 - continue current antiviral therapy per ID: abacavir, darunavir, lamivudine, ritonavir (zidovudine d/c'd on 5/17 for concern for  myelosuppression; truvada d/c'd 5/11 for concern about AKI) - continue azithromycin for MAC prophylaxis - Continue atovaquone for PCP prophylaxis (Dapsone stopped for concern for hemolytic anemia)  # Malnutrition: low albumin and cachectic on exam, secondary to AIDS wasting  - continue megace and mirtazapine  - d/c ensure supplements as he does not like them - d/c renal diet and change to regular diet since impact on kidney function is mininal and nutrition is critical   # Sacral Ulcer: seen by wound care who does not believe that it is decubitus but rather a sequelae of the HIV  - continue aquacel dressing and sitting on pillow   # Fen/GI:  - SLIV - Regular diet   # Prophylaxis:  - Lovenox - Add PPI which may help dypepsia   # Dispo: keep inpatient until clinical improvement  LOS: 11 days   Mat Carne MD 06/10/2011, 9:56 AM

## 2011-06-10 NOTE — Progress Notes (Signed)
INFECTIOUS DISEASE PROGRESS NOTE  ID: Daniel House is a 36 y.o. male with   Principal Problem:  *Acute renal failure Active Problems:  HIV disease  Syphilis in male  PCP (pneumocystis jiroveci pneumonia)  Back pain  Hematochezia  Sacral decubitus ulcer, stage II  Cachexia associated with AIDS  Hypocalcemia  Acute hyperkalemia  Metabolic acidosis  Hyponatremia  Subjective: Without complaints. Eating well.   Abtx:  Anti-infectives     Start     Dose/Rate Route Frequency Ordered Stop   06/09/11 1000   lamiVUDine (EPIVIR) 10 MG/ML solution 100 mg        100 mg Oral Daily 06/09/11 0932     06/06/11 1200   atovaquone (MEPRON) 750 MG/5ML suspension 1,500 mg        1,500 mg Oral Daily 06/06/11 1145     06/06/11 1200   abacavir (ZIAGEN) tablet 600 mg        600 mg Oral Daily 06/06/11 1145     06/01/11 0800   Darunavir Ethanolate (PREZISTA) tablet 800 mg        800 mg Oral Daily with breakfast 05/31/11 1500     06/01/11 0800   ritonavir (NORVIR) tablet 100 mg        100 mg Oral Daily with breakfast 05/31/11 1500     05/31/11 1600   dapsone tablet 100 mg  Status:  Discontinued        100 mg Oral Daily 05/31/11 1500 06/06/11 1145   05/31/11 1600   fluconazole (DIFLUCAN) tablet 100 mg        100 mg Oral Weekly 05/31/11 1500     05/31/11 1600   zidovudine (RETROVIR) capsule 300 mg  Status:  Discontinued        300 mg Oral Every 12 hours 05/31/11 1500 06/06/11 1145   05/31/11 1600   lamiVUDine (EPIVIR) tablet 150 mg  Status:  Discontinued        150 mg Oral Daily 05/31/11 1500 06/09/11 0931   05/31/11 1000   azithromycin (ZITHROMAX) tablet 1,200 mg        1,200 mg Oral Every 7 days 05/31/11 0640     05/31/11 1000   Darunavir Ethanolate (PREZISTA) tablet 800 mg  Status:  Discontinued        800 mg Oral Daily 05/31/11 0640 05/31/11 1137   05/31/11 1000   emtricitabine-tenofovir (TRUVADA) 200-300 MG per tablet 1 tablet  Status:  Discontinued        1 tablet Oral Daily 05/31/11  0640 05/31/11 1137   05/31/11 1000   ritonavir (NORVIR) tablet 100 mg  Status:  Discontinued        100 mg Oral Daily 05/31/11 0640 05/31/11 1137          Medications:  Scheduled:   . abacavir  600 mg Oral Daily  . atovaquone  1,500 mg Oral Daily  . azithromycin  1,200 mg Oral Q7 days  . darbepoetin (ARANESP) injection - NON-DIALYSIS  100 mcg Subcutaneous Q Mon-1800  . darunavir  800 mg Oral Q breakfast  . enoxaparin (LOVENOX) injection  30 mg Subcutaneous Q24H  . fluconazole  100 mg Oral Weekly  . furosemide  160 mg Oral BID  . lamiVUDine  100 mg Oral Daily  . lidocaine  1 patch Transdermal Q24H  . megestrol  400 mg Oral Daily  . mirtazapine  15 mg Oral QHS  . mulitivitamin with minerals  1 tablet Oral Daily  . pantoprazole  20 mg  Oral Q1200  . ritonavir  100 mg Oral Q breakfast  . DISCONTD: furosemide  80 mg Intravenous TID  . DISCONTD: furosemide  80 mg Intravenous BID    Objective: Vital signs in last 24 hours: Temp:  [98.4 F (36.9 C)-99.2 F (37.3 C)] 99.2 F (37.3 C) (05/21 1408) Pulse Rate:  [80-94] 94  (05/21 1408) Resp:  [20] 20  (05/21 1408) BP: (122-139)/(79-85) 122/79 mmHg (05/21 1408) SpO2:  [99 %-100 %] 100 % (05/21 1408) Weight:  [51.6 kg (113 lb 12.1 oz)-55.7 kg (122 lb 12.7 oz)] 51.6 kg (113 lb 12.1 oz) (05/21 0930)   General appearance: alert, cooperative and no distress Throat: normal findings: buccal mucosa normal and soft palate, uvula, and tonsils normal Resp: clear to auscultation bilaterally Cardio: regular rate and rhythm GI: normal findings: bowel sounds normal and soft, non-tender Extremities: edema none  Lab Results  Basename 06/10/11 0609 06/09/11 0630  WBC 3.0* 3.3*  HGB 7.9* 7.8*  HCT 23.1* 22.8*  NA 135 135  K 4.0 4.2  CL 103 104  CO2 20 19  BUN 57* 59*  CREATININE 5.27* 5.24*  GLU -- --   Liver Panel No results found for this basename: PROT:2,ALBUMIN:2,AST:2,ALT:2,ALKPHOS:2,BILITOT:2,BILIDIR:2,IBILI:2 in the last 72  hours Sedimentation Rate No results found for this basename: ESRSEDRATE in the last 72 hours C-Reactive Protein No results found for this basename: CRP:2 in the last 72 hours  Microbiology: No results found for this or any previous visit (from the past 240 hour(s)).  Studies/Results: No results found.   Assessment/Plan: AIDS (3TC/ABC/DRVr) Recent PCP (on atovaqoune) ARF        Due to NSAIDS, Bactrim, Tenofovir Anemia (multifactorial)  My great appreciation to Renal and FPTS.  Will cont his current therapies. Discussed with pt that his renal fxn is not changed that much in last 24h.  His CD4 has come up and his VL has come down (dramatically for his VL, both of which are encouraging signs that his AIDS should improve.    Johny Sax Infectious Diseases [phone] 06/10/2011, 5:58 PM   LOS: 11 days

## 2011-06-11 LAB — RENAL FUNCTION PANEL
Albumin: 2.2 g/dL — ABNORMAL LOW (ref 3.5–5.2)
BUN: 55 mg/dL — ABNORMAL HIGH (ref 6–23)
Chloride: 100 mEq/L (ref 96–112)
GFR calc Af Amer: 16 mL/min — ABNORMAL LOW (ref >90)
GFR calc non Af Amer: 14 mL/min — ABNORMAL LOW (ref >90)
Phosphorus: 2.6 mg/dL (ref 2.3–4.6)
Potassium: 3.7 mEq/L (ref 3.5–5.1)
Sodium: 132 mEq/L — ABNORMAL LOW (ref 135–145)

## 2011-06-11 LAB — HEMOGLOBIN AND HEMATOCRIT, BLOOD
HCT: 22.6 % — ABNORMAL LOW (ref 39.0–52.0)
Hemoglobin: 7.7 g/dL — ABNORMAL LOW (ref 13.0–17.0)

## 2011-06-11 NOTE — Progress Notes (Signed)
Family Medicine Teaching Service Attending Note  I discussed patient Daniel House  with Dr. [name] and reviewed their note for today.  I agree with their assessment and plan.     Improving or stable labs and clinical condition Need to begin plans for transition to home care hopefully in next day or 2

## 2011-06-11 NOTE — Progress Notes (Signed)
Daily Progress Note Daniel House. Daniel House, M.D., M.B.A  Family Medicine PGY-1 Pager [phone]  Subjective:  Overnight: no events  Daniel House sitting on bed eating. No complaints this AM. States sore on sacrum is much less painful.   - hgb stable at 7.7, creatinine slightly improved to 4.97, otherwise stable    Objective: Vital signs in last 24 hours: Temp:  [98.7 F (37.1 C)-99.7 F (37.6 C)] 99.2 F (37.3 C) (05/22 0516) Pulse Rate:  [90-99] 90  (05/22 0516) Resp:  [20] 20  (05/22 0516) BP: (122-134)/(79-86) 134/86 mmHg (05/22 0516) SpO2:  [99 %-100 %] 99 % (05/22 0516) Weight:  [109 lb 9.1 oz (49.7 kg)-113 lb 12.1 oz (51.6 kg)] 109 lb 9.1 oz (49.7 kg) (05/22 0516) Weight change: -9 lb 0.6 oz (-4.1 kg) Last BM Date: 06/09/11  Filed Weights   06/10/11 0619 06/10/11 0930 06/11/11 0516  Weight: 122 lb 12.7 oz (55.7 kg) 113 lb 12.1 oz (51.6 kg) 109 lb 9.1 oz (49.7 kg)   Intake/Output from previous day: 05/21 0701 - 05/22 0700 In: 440 [P.O.:440] Out: 2700 [Urine:2700] Intake/Output this shift:   Physical Exam  Constitutional: middle age AAM; non-distressed cachectic, very relaxed and pleasant   Cardiovascular: Regular rhythm and normal heart sounds. flow murmur heard. Tachycardic.  Respiratory: normal work of breathing. No rales He has no wheezes. He exhibits no tenderness.  GI: Soft. He exhibits no distension.  Musculoskeletal: Normal range of motion. He exhibits no edema and no tenderness.  Neurological: He is alert and oriented to person, place, and time. CN2-12 grossly intact. Lower Extremities: 2+ pitting edema of feet Skin: sacral ulcer healing with half-coverage of new tissue  Lab Results:  Basename 06/11/11 0500 06/10/11 0609 06/09/11 0630  WBC -- 3.0* 3.3*  HGB 7.7* 7.9* --  HCT 22.6* 23.1* --  PLT -- 405* 360   BMET  Basename 06/11/11 0500 06/10/11 0609  NA 132* 135  K 3.7 4.0  CL 100 103  CO2 22 20  GLUCOSE 121* 97  BUN 55* 57*  CREATININE 4.97*  5.27*  CALCIUM 8.8 9.0    Studies/Results: Lower Extremity Dopplers: no DVT   Medications:  Scheduled:    . abacavir  600 mg Oral Daily  . atovaquone  1,500 mg Oral Daily  . azithromycin  1,200 mg Oral Q7 days  . darbepoetin (ARANESP) injection - NON-DIALYSIS  100 mcg Subcutaneous Q Mon-1800  . darunavir  800 mg Oral Q breakfast  . enoxaparin (LOVENOX) injection  30 mg Subcutaneous Q24H  . fluconazole  100 mg Oral Weekly  . furosemide  160 mg Oral BID  . lamiVUDine  100 mg Oral Daily  . lidocaine  1 patch Transdermal Q24H  . megestrol  400 mg Oral Daily  . mirtazapine  15 mg Oral QHS  . mulitivitamin with minerals  1 tablet Oral Daily  . pantoprazole  20 mg Oral Q1200  . ritonavir  100 mg Oral Q breakfast  . DISCONTD: furosemide  80 mg Intravenous TID  . DISCONTD: furosemide  80 mg Intravenous BID   JYN:WGNFAOZHYQMVH, acetaminophen, albuterol, calcium carbonate, promethazine, traMADol  Assessment/Plan: 36 yo male with h/o newly diagnosed HIV/AIDS (CD4 count of 20 on 05/22/11) who presents with worsening shortness of breath, weakness and intermittent chest pain and who was admitted for hyperkalemia, acute renal failure, metabolic acidosis, and now pancytopenia with hemolysis s/p 2 units of prbc now stable.    # Acute renal failure: likely secondary to combination of poor oral  intake and low volume status as well as recent use of bactrim, naproxen and truvada.  Renal US negative for obstruction. FeNa of 5 suggests ATN.  - Creatinine 4.97 on lasix 160mg  PO BID - I imagine after a few days of downtrending in creatinine he may be appropriate for d/c if other factors are stable   # Hyperkalemia: Resolved, s/p calcium gluconate, D50, insulin, sodium bicarb in the ED.  kayexalate x 1 on 5/13.  # Metabolic Acidosis: likely secondary to renal failure. Resolved s/p isotonic bicarb fluids. Recurred 5/16 and given 50 meq bicarb x 2 and resolved - resolved   # Anemia, Normocytic:  Unclear etiology, but most likely anemia of chronic disease; However Hgb Dropped from 10.8  To 7.8 to 5.8, the 2 units PRBC on 5/17; hemolysis labs show elevated LDH and haptoglobin, so not clearly intravascular; No known source of active bleeding and GI bleed unlikely if FOB negative; Fe level normal and low TIBC so no microcytic component; B12 normal so no clear macrocytic component.    Lab Results  Component Value Date   HGB 7.7* 06/11/2011   - stable, trend and transfuse @ 7.0 or lower  # Hyponatremia: stable   # Pulmonary Edema: 5/17, resolved with Lasix   # Thrombocytopenia: decrease since early May. No petechia or bruising on exam. INR 1.27 with mildly elevated PT at 16.2. PTT normal. Increased from 53 to 245.  - resolved  # HIV/AIDS: CD4 count of 20 on 05/22/11 and viral load 837K on 05/03/11. Repeat counts have mildly improved: viral load: 3667; CD4 count: 50 - continue current antiviral therapy per ID: abacavir, darunavir, lamivudine, ritonavir (zidovudine d/c'd on 5/17 for concern for myelosuppression; truvada d/c'd 5/11 for concern about AKI) - continue azithromycin for MAC prophylaxis - Continue atovaquone for PCP prophylaxis (Dapsone stopped for concern for hemolytic anemia)  # Chest Pain - Acute onset 5/18, differential diagnosis includes MI, PE, pericarditis, MSK pain, GERD, pleuritis pain from known pleural effusion or candidal esophagitis; MI ruled out: troponin elevated, but in setting of renal failure not as accurate; EKG showed NSR x 2; PE - not likely 2/2 negative dopplers for DVT; possibly a pericarditis, but cannot take NSAIDS 2/2 to kidney function - Improved today, continue to follow  # Malnutrition: low albumin and cachectic on exam, secondary to AIDS wasting  - continue megace and mirtazapine  - regular diet well tolerate, continue  # Sacral Ulcer: seen by wound care who does not believe that it is decubitus but rather a sequelae of the HIV  - much improved,  continue aquacel dressing and sitting on pillow   # Fen/GI:  - SLIV - Regular diet   # Prophylaxis:  - Lovenox - PPI  # Dispo: keep inpatient until clinical improvement     LOS: 12 days   Daniel Carne MD 06/11/2011, 8:45 AM

## 2011-06-11 NOTE — Progress Notes (Signed)
Physical Therapy Treatment Patient Details Name: Daniel House MRN: [id number] DOB: 11/29/1975 Today's Date: 06/11/2011 Time: 8295-6213 PT Time Calculation (min): 16 min  PT Assessment / Plan / Recommendation Comments on Treatment Session  More progress, still encouraging pt to ambulate with staff during the day as he reports he really hasn't been doing this. RN aware of my recommendations and reports MD has also made this comment.     Follow Up Recommendations  No PT follow up    Barriers to Discharge        Equipment Recommendations  None recommended by PT    Recommendations for Other Services    Frequency     Plan Discharge plan remains appropriate;Frequency remains appropriate    Precautions / Restrictions Precautions Precautions: Fall       Mobility  Bed Mobility Supine to Sit: 7: Independent Sitting - Scoot to Edge of Bed: 7: Independent Transfers Sit to Stand: 6: Modified independent (Device/Increase time);With upper extremity assist Stand to Sit: 6: Modified independent (Device/Increase time);With upper extremity assist Ambulation/Gait Ambulation/Gait Assistance: 5: Supervision Ambulation Distance (Feet): 300 Feet Assistive device: None Ambulation/Gait Assistance Details: still with staggered gait, almost hypotonal in nature, cues for core engagement; increased sway with head turns and pt needing to slow down; still out of breath at the end of session but able to ambulate and tolerate more    Exercises  LAQ, bilateral LE, 10 reps x3 Heel raises, bilateral LE, 10 reps x3 APs, bilateral LE, 10 reps x3    PT Goals Acute Rehab PT Goals PT Goal: Ambulate - Progress: Progressing toward goal Additional Goals Additional Goal #1: Pt will demo decreased risk of falls with DGI greater than or equal to 20/24.  PT Goal: Additional Goal #1 - Progress: Goal set today  Visit Information  Last PT Received On: 06/11/11 Assistance Needed: +1    Subjective Data  Subjective:  Im dizzy.   Cognition  Overall Cognitive Status: Appears within functional limits for tasks assessed/performed Arousal/Alertness: Awake/alert Orientation Level: Appears intact for tasks assessed Behavior During Session: [facility] for tasks performed    Balance  High Level Balance High Level Balance Activites: Sudden stops;Head turns;Turns;Direction changes;Side stepping High Level Balance Comments: all high level activities required patient to slow down for increased safety and acuracy; mingaurdA for sidestepping x 20 feet bilateral directions  End of Session PT - End of Session Equipment Utilized During Treatment: Gait belt Activity Tolerance: Patient tolerated treatment well Patient left: in chair;with call bell/phone within reach Nurse Communication: Mobility status    WHITLOW,Shateka Petrea HELEN 06/11/2011, 11:38 AM

## 2011-06-11 NOTE — Discharge Summary (Signed)
Physician Discharge Summary  Patient ID: Daniel House MRN: [id number] DOB/AGE: [birth year] 36 y.o.  Admit date: 05/30/2011 Discharge date: 06/12/2011  Admission Diagnoses: Acute Kidney Injury, Hyperkalemia, Hypoxemia, HIV  Discharge Diagnoses:  Principal Problem:  *Acute renal failure Active Problems:  HIV disease  Syphilis in male  PCP (pneumocystis jiroveci pneumonia)  Back pain  Hematochezia  Sacral decubitus ulcer, stage II  Cachexia associated with AIDS  Hypocalcemia  Acute hyperkalemia  Metabolic acidosis  Hyponatremia   Discharged Condition: stable  Hospital Course:   36 yo male with h/o newly diagnosed HIV/AIDS (CD4 count of 20 on 05/22/11) who presents with worsening shortness of breath, weakness and intermittent chest pain and who was admitted for hyperkalemia, acute renal failure, metabolic acidosis, and now pancytopenia with hemolysis s/p 2 units of prbc now stable.   # Acute renal failure: Likely secondary to combination of poor oral intake and low volume status as well as recent use of bactrim, naproxen and truvada, so all of those medications stopped on admission. Renal US negative for obstruction. FeNa of 5 suggests ATN. Patient given IVF throughout stay and nephrology team observed care. He was diuresed for several days and creatnine trended down. Max creat was 5.27 on 5/21.   # Hyperkalemia: Resolved, s/p calcium gluconate, D50, insulin, sodium bicarb in the ED. kayexalate x 1 on 5/13.   # Metabolic Acidosis: likely secondary to renal failure. Resolved s/p isotonic bicarb fluids. Recurred 5/16 and given 50 meq bicarb x 2 and resolved.   # Anemia, Normocytic: Unclear etiology, but most likely anemia of chronic disease; However Hgb Dropped from 10.8 To 7.8 to 5.8, the 2 units PRBC on 5/17; hemolysis labs show elevated LDH and haptoglobin, so not clearly intravascular; No known source of active bleeding and GI bleed unlikely if FOB negative; Fe level normal and low  TIBC so no microcytic component; B12 normal so no clear macrocytic component. Given darbopoeitin while inpatient per recommendation from renal. Improved prior to d/c.   # Hyponatremia: persistent but stable   # Pulmonary Edema: 5/17 iatrogenic from volume overload, resolved with Lasix   # Thrombocytopenia: decrease since early May. No petechia or bruising on exam. INR 1.27 with mildly elevated PT at 16.2. PTT normal. Resolved prior to d/c.   # HIV/AIDS: CD4 count of 20 on 05/22/11 and viral load 837K on 05/03/11. Repeat counts have mildly improved: viral load: 3667; CD4 count: 50  While inpatient he was treated antiviral therapy per ID: abacavir, darunavir, lamivudine, ritonavir (zidovudine d/c'd on 5/17 for concern for myelosuppression; truvada d/c'd 5/11 for concern about AKI); Azithromycin given for MAC prophylaxis; Atovaquone given for PCP prophylaxis (Dapsone stopped for concern for hemolytic anemia)   # Chest Pain - Acute onset 5/18, differential diagnosis includes MI, PE, pericarditis, MSK pain, GERD, pleuritis pain from known pleural effusion or candidal esophagitis; MI ruled out: troponin elevated, but in setting of renal failure not as accurate; EKG showed NSR x 2; PE - not likely 2/2 negative dopplers for DVT; possibly a pericarditis, but cannot take NSAIDS 2/2 to kidney function; resolved    # Malnutrition: low albumin and cachectic on exam, secondary to AIDS wasting; given megace and mirtazapine throughout admission.; regular diet well tolerated but he didn't like Ensure   # Sacral Ulcer: seen by wound care who does not believe that it is decubitus but rather a sequelae of the HIV; treated daily with Aquacel dressin g  Consults: ID; nephrology; physical therapy  Discharge Exam: Blood pressure 97/56,  pulse 80, temperature 97.1 F (36.2 C), temperature source Oral, resp. rate 20, height 6' (1.829 m), weight 102 lb 11.8 oz (46.6 kg), SpO2 97.00%. Constitutional: middle age AAM;  non-distressed cachectic, very relaxed and pleasant  Cardiovascular: Regular rhythm and normal heart sounds. flow murmur heard. Tachycardic.  Respiratory: normal work of breathing. No rales He has no wheezes. He exhibits no tenderness.  GI: Soft. He exhibits no distension.  Musculoskeletal: Normal range of motion. He exhibits no edema and no tenderness.  Neurological: He is alert and oriented to person, place, and time. CN2-12 grossly intact.  Lower Extremities: 2+ pitting edema of feet  Skin: sacral ulcer healing with half-coverage of new tissue   Disposition: 01-Home or Self Care  Discharge Orders    Future Appointments: Provider: Department: Dept Phone: Center:   06/19/2011 2:00 PM [facility] [phone] [facility]   06/23/2011 10:00 AM Garnetta Buddy, MD [facility] [phone] [facility]   07/03/2011 11:00 AM Gardiner Barefoot, MD [facility] [phone] [facility]     Medication List  As of 06/17/2011  9:58 PM   STOP taking these medications         emtricitabine-tenofovir 200-300 MG per tablet      ENSURE      ferrous sulfate 325 (65 FE) MG tablet      magic mouthwash w/lidocaine Soln      naproxen sodium 220 MG tablet      predniSONE 20 MG tablet      sulfamethoxazole-trimethoprim 800-160 MG per tablet         TAKE these medications         abacavir 300 MG tablet   Commonly known as: ZIAGEN   Take 2 tablets (600 mg total) by mouth daily.      albuterol 108 (90 BASE) MCG/ACT inhaler   Commonly known as: PROVENTIL HFA;VENTOLIN HFA   Inhale 2 puffs into the lungs every 4 (four) hours as needed. For shortness of breath        atovaquone 750 MG/5ML suspension   Commonly known as: MEPRON   Take 10 mLs (1,500 mg total) by mouth daily.      azithromycin 600 MG tablet   Commonly known as: ZITHROMAX   Take 2 tablets (1,200 mg total) by mouth every 7 (seven) days.      Darunavir Ethanolate 800 MG tablet   Commonly known as: PREZISTA   Take  1 tablet (800 mg total) by mouth daily.      esomeprazole 20 MG packet   Commonly known as: NEXIUM   Take 20 mg by mouth daily before breakfast.      fluconazole 100 MG tablet   Commonly known as: DIFLUCAN   Take 1 tablet (100 mg total) by mouth once a week.      lamiVUDine 10 MG/ML solution   Commonly known as: EPIVIR   Take 10 mLs (100 mg total) by mouth daily.      megestrol 40 MG/ML suspension   Commonly known as: MEGACE   Take 10 mLs (400 mg total) by mouth daily.      mirtazapine 15 MG tablet   Commonly known as: REMERON   Take 1 tablet (15 mg total) by mouth at bedtime.      ritonavir 100 MG Tabs   Commonly known as: NORVIR   Take 1 tablet (100 mg total) by mouth daily.           Follow-up Information  Follow up with Mat Carne, MD on 06/23/2011. (10:00 AM )    Contact information:   [address] [place] [address] [phone]       Follow up with [facility] on 06/13/2011. (Lab draws only @ 11:45 AM)        Follow Up Issues: 1. Check creatinine and Hgb  2. ID medication adherence   Signed: Mat Carne 06/17/2011, 9:58 PM

## 2011-06-11 NOTE — Progress Notes (Addendum)
[facility] - PROGRESS NOTE Resident Note   Please see below for attending addendum to resident note.  Subjective:   Weight is down by 4 pounds since yesterday, urine output was 2700cc after we switched to PO Lasix 160mg  BID.  Cr has started to trend down from 5.27-->4.97 which is a good sign. We also liberalized his diet yesterday and his Phos remains low at 2.6 and K is 3.7 and he really enjoyed the new diet. Na is slightly down from 135-->132. He reports doing better today although he still has occasional SOB with walking or exerting himself.  Denies any chest pain this AM. Reports he is drinking lots of water   Objective:    Vital Signs:   Temp:  [98.7 F (37.1 C)-99.7 F (37.6 C)] 99.2 F (37.3 C) (05/22 0516) Pulse Rate:  [90-99] 90  (05/22 0516) Resp:  [20] 20  (05/22 0516) BP: (122-134)/(79-86) 134/86 mmHg (05/22 0516) SpO2:  [99 %-100 %] 99 % (05/22 0516) Weight:  [109 lb 9.1 oz (49.7 kg)-113 lb 12.1 oz (51.6 kg)] 109 lb 9.1 oz (49.7 kg) (05/22 0516) Last BM Date: 06/09/11  24-hour weight change: Weight change: -9 lb 0.6 oz (-4.1 kg)  Weight trends: Filed Weights   06/10/11 0619 06/10/11 0930 06/11/11 0516  Weight: 122 lb 12.7 oz (55.7 kg) 113 lb 12.1 oz (51.6 kg) 109 lb 9.1 oz (49.7 kg)   Intake/Output:  05/21 0701 - 05/22 0700 In: 440 [P.O.:440] Out: 2700 [Urine:2700]  Physical Exam:  General:  Vital signs reviewed and noted. Thin appearing, in no acute distress; alert, appropriate and cooperative throughout examination. JVD 3cm (flat position)  Lungs:  Normal respiratory effort. Clear to auscultation BL without crackles or wheezes.   Heart:  RRR. S1 and S2 normal without gallop, +systolic murmur, or rubs.   Abdomen:  BS normoactive. Soft, flat, Nondistended, non-tender.   Extremities:  +1-2 pitting edema on LE more on ankle and feet up to mid knees (significant improvement)   Labs: Basic Metabolic Panel:  Lab 06/11/11 5409 06/10/11 0609  06/09/11 0630 06/08/11 0547 06/07/11 0457 06/06/11 0640 06/05/11 0635  NA 132* 135 135 133* 133* -- --  K 3.7 4.0 4.2 4.5 4.9 -- --  CL 100 103 104 104 107 -- --  CO2 22 20 19  17* 18* -- --  GLUCOSE 121* 97 93 95 89 -- --  BUN 55* 57* 59* 61* 63* -- --  CREATININE 4.97* 5.27* 5.24* 5.14* 5.04* -- --  CALCIUM 8.8 9.0 8.9 -- -- -- --  MG -- -- -- -- -- -- --  PHOS 2.6 -- -- -- -- 3.4 2.9   Liver Function Tests:  Lab 06/11/11 0500 06/06/11 0640 06/05/11 0635  AST -- -- --  ALT -- -- --  ALKPHOS -- -- --  BILITOT -- -- --  PROT -- -- --  ALBUMIN 2.2* 1.8* 1.7*   CBC:  Lab 06/11/11 0500 06/10/11 0609 06/09/11 0630 06/08/11 0904 06/07/11 0457 06/06/11 0644  WBC -- 3.0* 3.3* 3.7* 3.8* 4.1  NEUTROABS -- -- -- -- -- --  HGB 7.7* 7.9* 7.8* 8.4* 9.2* --  HCT 22.6* 23.1* 22.8* 24.7* 27.4* --  MCV -- 90.2 89.4 89.5 89.0 95.3  PLT -- 405* 360 314 245 210     Medications:    Infusions:    . sodium chloride 20 mL/hr at 06/05/11 1800    Scheduled Medications:    . abacavir  600 mg Oral Daily  .  atovaquone  1,500 mg Oral Daily  . azithromycin  1,200 mg Oral Q7 days  . darbepoetin (ARANESP) injection - NON-DIALYSIS  100 mcg Subcutaneous Q Mon-1800  . darunavir  800 mg Oral Q breakfast  . enoxaparin (LOVENOX) injection  30 mg Subcutaneous Q24H  . fluconazole  100 mg Oral Weekly  . furosemide  160 mg Oral BID  . lamiVUDine  100 mg Oral Daily  . lidocaine  1 patch Transdermal Q24H  . megestrol  400 mg Oral Daily  . mirtazapine  15 mg Oral QHS  . mulitivitamin with minerals  1 tablet Oral Daily  . pantoprazole  20 mg Oral Q1200  . ritonavir  100 mg Oral Q breakfast  . DISCONTD: furosemide  80 mg Intravenous TID  . DISCONTD: furosemide  80 mg Intravenous BID    PRN Medications: acetaminophen, acetaminophen, albuterol, calcium carbonate, promethazine, traMADol   Assessment/ Plan:    1. Acute renal failure with hyperkalemia: likely ATN 2/2 Bactrim/Tenofovir/nonsteroidal  anti-inflammatory drug therapy/Truvada. (Baseline creatinine around 1 on May 22, 2011; ANA and ANCA negative, normal ACE level,about 885 mg proteinuria; creatinine on May 10 was 3.9). Cr this AM is starting to trend down from 5.27 to 4.97 which is a good sign. He continues to have great urine output of 2.7L yesterday with Lasix 160mg  po bid; however his Na is trending down today from 135 to 132, will keep Lasix dose at 160mg  BID today since he is hypervolemic hyponatremic given that he is eating more salt and drinking more. His weight has been fluctuating from 95-->123-->105-->131-->125-->120-->122-->113-->109 pounds. 2. Dyspnea- due to pulm edema, see 5/16 cxr. Intermittently symptomatic. But lung exam is clear. 3. Hyponatremia: Na 132, likely hypervolemic hyponatremia  will keep Lasix dose the same today 4. Pneumocystis pneumonia: TMP/SMX stopped and patient was switched to Dapsone (due to concerns with hyperkalemia/acute renal failure). Continue dapsone.  5. HIV/AIDS: Anti-retroviral therapy adjustments per infectious disease: Prezista, Diflucan, Lamivudine, Norvir, and Abacavir. Appreciate ID's input. 6. Anemia- chronic disease likely due to HIV vs. Hemolysis; although is haptoglobin is elevated and G6PD is normal; LDH is elevated at 481. Hb trends 8.4-->7.8-->7.9-->7.7 Anemia panel shows Ferritin 2453, iron 56, sat ratio 38, folate 12.5, and vitamin B12 304. Continue aranesp.  7. Metabolic acidosis: resolved. Bicarb 22 8. Chest pain: resolved. likely GERD/MSK. Consider discontinue sucralfate. LE Venous Doppler negative for DVT. Slightly elevated Tro likely due to kidney failure.  9. Malnutrition - with potassium of 3.7 and Phos 2.6 after liberalizing diet with low Na & Phos.  Will continue current diet  10. Dispo- pending clinical improvement  Length of Stay: 12 days  Patient history and plan of care reviewed with attending, Dr.Deone Leifheit.   Lindley Magnus, Internal Medicine Resident 06/11/2011,  6:30 AM I have seen and examined this patient and agree with plan as outlined above.  Will continue current lasix another day as he is still hypervolemic and hyponatremic (mild) as well.  Advised him to stop drinking so much water.  Happy to see that renal function finally appears to the turning the corner.Camille Bal B,MD 06/11/2011 12:00 PM

## 2011-06-12 ENCOUNTER — Encounter (HOSPITAL_COMMUNITY): Payer: Self-pay | Admitting: Family Medicine

## 2011-06-12 LAB — HEMOGLOBIN AND HEMATOCRIT, BLOOD: HCT: 24.9 % — ABNORMAL LOW (ref 39.0–52.0)

## 2011-06-12 LAB — RENAL FUNCTION PANEL
Albumin: 2.3 g/dL — ABNORMAL LOW (ref 3.5–5.2)
BUN: 54 mg/dL — ABNORMAL HIGH (ref 6–23)
Calcium: 9.1 mg/dL (ref 8.4–10.5)
Chloride: 97 mEq/L (ref 96–112)
Creatinine, Ser: 4.64 mg/dL — ABNORMAL HIGH (ref 0.50–1.35)
GFR calc non Af Amer: 15 mL/min — ABNORMAL LOW (ref >90)
Phosphorus: 3.3 mg/dL (ref 2.3–4.6)

## 2011-06-12 MED ORDER — ATOVAQUONE 750 MG/5ML PO SUSP: 1500.0000 mg | Freq: Every day | ORAL | Status: DC

## 2011-06-12 MED ORDER — FUROSEMIDE 80 MG PO TABS
80.0000 mg | ORAL_TABLET | Freq: Two times a day (BID) | ORAL | Status: DC
Start: 2011-06-12 — End: 2011-06-13
  Administered 2011-06-12: 80 mg via ORAL
  Filled 2011-06-12 (×2): qty 1

## 2011-06-12 MED ORDER — ESOMEPRAZOLE MAGNESIUM 20 MG PO PACK: 20.0000 mg | PACK | Freq: Every day | ORAL | Status: DC

## 2011-06-12 MED ORDER — ABACAVIR SULFATE 300 MG PO TABS: 600.0000 mg | ORAL_TABLET | Freq: Every day | ORAL | Status: DC

## 2011-06-12 MED ORDER — FLUCONAZOLE 100 MG PO TABS: 100.0000 mg | ORAL_TABLET | ORAL | Status: AC

## 2011-06-12 MED ORDER — PANTOPRAZOLE SODIUM 20 MG PO TBEC: 20.0000 mg | DELAYED_RELEASE_TABLET | Freq: Every day | ORAL | Status: DC

## 2011-06-12 MED ORDER — POTASSIUM CHLORIDE CRYS ER 20 MEQ PO TBCR
20.0000 meq | EXTENDED_RELEASE_TABLET | Freq: Once | ORAL | Status: AC
  Administered 2011-06-12: 20 meq via ORAL
  Filled 2011-06-12: qty 1

## 2011-06-12 MED ORDER — AZITHROMYCIN 600 MG PO TABS: 1200.0000 mg | ORAL_TABLET | ORAL | Status: DC

## 2011-06-12 MED ORDER — LAMIVUDINE 10 MG/ML PO SOLN: 100.0000 mg | Freq: Every day | ORAL | Status: DC

## 2011-06-12 NOTE — Discharge Instructions (Signed)
Dear Daniel House,   As you know you were hospitalized for kidney failure that was caused by 3 different medications you were taking: Truvada, Bactrim, and Naproxen. The Truvada and Bactrim were changed to other medications. YOU SHOULD NOT TAKE NAPROXEN or IBUPROFEN until cleared by me, your doctor. For any aches and pains at home, please take tylenol only.   You will need close follow-up. Please come to the family medicine clinic on Friday 5/24 for a blood draw. Then, you need to come back pm Tuesday 5/28 and Friday 5/31 for lab draws also. I will call you after each one to let you know the results. If you start to feel poorly, have lots of swelling, or difficulty breathing, then please call the clinic at [phone]. If it is late at night and you feel like you need to be seen by a doctor immediately, then come to the [facility] ED.   I will see you at our appointment on June 3rd, but I will call you this weekend.   Take Care,   Daniel Amble, MD

## 2011-06-12 NOTE — Progress Notes (Signed)
INFECTIOUS DISEASE PROGRESS NOTE  ID: Daniel House is a 36 y.o. male with   Principal Problem:  *Acute renal failure Active Problems:  HIV disease  Syphilis in male  PCP (pneumocystis jiroveci pneumonia)  Back pain  Hematochezia  Sacral decubitus ulcer, stage II  Cachexia associated with AIDS  Hypocalcemia  Acute hyperkalemia  Metabolic acidosis  Hyponatremia  Subjective: Up, taking shower.   Abtx:  Anti-infectives     Start     Dose/Rate Route Frequency Ordered Stop   06/09/11 1000   lamiVUDine (EPIVIR) 10 MG/ML solution 100 mg        100 mg Oral Daily 06/09/11 0932     06/06/11 1200   atovaquone (MEPRON) 750 MG/5ML suspension 1,500 mg        1,500 mg Oral Daily 06/06/11 1145     06/06/11 1200   abacavir (ZIAGEN) tablet 600 mg        600 mg Oral Daily 06/06/11 1145     06/01/11 0800   Darunavir Ethanolate (PREZISTA) tablet 800 mg        800 mg Oral Daily with breakfast 05/31/11 1500     06/01/11 0800   ritonavir (NORVIR) tablet 100 mg        100 mg Oral Daily with breakfast 05/31/11 1500     05/31/11 1600   dapsone tablet 100 mg  Status:  Discontinued        100 mg Oral Daily 05/31/11 1500 06/06/11 1145   05/31/11 1600   fluconazole (DIFLUCAN) tablet 100 mg        100 mg Oral Weekly 05/31/11 1500     05/31/11 1600   zidovudine (RETROVIR) capsule 300 mg  Status:  Discontinued        300 mg Oral Every 12 hours 05/31/11 1500 06/06/11 1145   05/31/11 1600   lamiVUDine (EPIVIR) tablet 150 mg  Status:  Discontinued        150 mg Oral Daily 05/31/11 1500 06/09/11 0931   05/31/11 1000   azithromycin (ZITHROMAX) tablet 1,200 mg        1,200 mg Oral Every 7 days 05/31/11 0640     05/31/11 1000   Darunavir Ethanolate (PREZISTA) tablet 800 mg  Status:  Discontinued        800 mg Oral Daily 05/31/11 0640 05/31/11 1137   05/31/11 1000   emtricitabine-tenofovir (TRUVADA) 200-300 MG per tablet 1 tablet  Status:  Discontinued        1 tablet Oral Daily 05/31/11 0640 05/31/11  1137   05/31/11 1000   ritonavir (NORVIR) tablet 100 mg  Status:  Discontinued        100 mg Oral Daily 05/31/11 0640 05/31/11 1137          Medications:  Scheduled:   . abacavir  600 mg Oral Daily  . atovaquone  1,500 mg Oral Daily  . azithromycin  1,200 mg Oral Q7 days  . darbepoetin (ARANESP) injection - NON-DIALYSIS  100 mcg Subcutaneous Q Mon-1800  . darunavir  800 mg Oral Q breakfast  . enoxaparin (LOVENOX) injection  30 mg Subcutaneous Q24H  . fluconazole  100 mg Oral Weekly  . furosemide  80 mg Oral BID  . lamiVUDine  100 mg Oral Daily  . lidocaine  1 patch Transdermal Q24H  . megestrol  400 mg Oral Daily  . mirtazapine  15 mg Oral QHS  . mulitivitamin with minerals  1 tablet Oral Daily  . pantoprazole  20 mg Oral  Q1200  . potassium chloride  20 mEq Oral Once  . ritonavir  100 mg Oral Q breakfast  . DISCONTD: furosemide  160 mg Oral BID    Objective: Vital signs in last 24 hours: Temp:  [97.1 F (36.2 C)-98.5 F (36.9 C)] 97.1 F (36.2 C) (05/23 1440) Pulse Rate:  [80-96] 80  (05/23 1440) Resp:  [18-20] 20  (05/23 1440) BP: (97-127)/(56-86) 97/56 mmHg (05/23 1440) SpO2:  [97 %-99 %] 97 % (05/23 1440) Weight:  [46.6 kg (102 lb 11.8 oz)] 46.6 kg (102 lb 11.8 oz) (05/23 0551)   In shower   Lab Results  Basename 06/12/11 0635 06/11/11 0500 06/10/11 0609  WBC -- -- 3.0*  HGB 8.5* 7.7* --  HCT 24.9* 22.6* --  NA 132* 132* --  K 3.4* 3.7 --  CL 97 100 --  CO2 21 22 --  BUN 54* 55* --  CREATININE 4.64* 4.97* --  GLU -- -- --   Liver Panel  Basename 06/12/11 0635 06/11/11 0500  PROT -- --  ALBUMIN 2.3* 2.2*  AST -- --  ALT -- --  ALKPHOS -- --  BILITOT -- --  BILIDIR -- --  IBILI -- --   Sedimentation Rate No results found for this basename: ESRSEDRATE in the last 72 hours C-Reactive Protein No results found for this basename: CRP:2 in the last 72 hours  Microbiology: No results found for this or any previous visit (from the past 240  hour(s)).  Studies/Results: No results found.   Assessment/Plan: AIDS (3TC/ABC/DRVr)  Recent PCP (on atovaqoune)  ARF  Due to NSAIDS, Bactrim, Tenofovir  Anemia (multifactorial)  My great appreciation to Renal and FPTS.   Cr has come down slightly. Will have him f/u in ID clinic in 2 weeks after dc.    Johny Sax Infectious Diseases [phone] 06/12/2011, 4:01 PM   LOS: 13 days

## 2011-06-12 NOTE — Progress Notes (Signed)
Physical Therapy Treatment Patient Details Name: Daniel House MRN: [id number] DOB: 1975-07-02 Today's Date: 06/12/2011 Time: 1027-2536 PT Time Calculation (min): 15 min  PT Assessment / Plan / Recommendation Comments on Treatment Session  Doing well. Still staggering with gait and DGI of 16. Plan to develop HEP for pt to take home. He is also able to verbalize the importance of a progressive walking program at home.    Follow Up Recommendations  No PT follow up    Barriers to Discharge        Equipment Recommendations  None recommended by PT    Recommendations for Other Services    Frequency     Plan Discharge plan remains appropriate;Frequency remains appropriate    Precautions / Restrictions Precautions Precautions: Fall       Mobility  Bed Mobility Supine to Sit: 7: Independent Sitting - Scoot to Edge of Bed: 7: Independent Details for Bed Mobility Assistance: pt left sitting EOB Transfers Sit to Stand: 6: Modified independent (Device/Increase time);With upper extremity assist;From bed Stand to Sit: 6: Modified independent (Device/Increase time);With upper extremity assist;To bed Details for Transfer Assistance: uses hands for assist to stand and sit Ambulation/Gait Ambulation/Gait Assistance: 5: Supervision Ambulation Distance (Feet): 400 Feet Assistive device: None Ambulation/Gait Assistance Details: still with slight stagger especially with higher challenging activities but no major LOB today, see DGI for further detail Gait Pattern: Step-through pattern;Decreased stride length;Narrow base of support Stairs: Yes Stairs Assistance: 4: Min assist Stairs Assistance Details (indicate cue type and reason): slow ascent and descent, min tactile cues for stability Stair Management Technique: One rail Left;Step to pattern    Exercises      PT Goals Acute Rehab PT Goals PT Goal: Supine/Side to Sit - Progress: Met PT Goal: Sit at Edge Of Bed - Progress: Met PT Goal: Sit  to Stand - Progress: Met PT Goal: Stand to Sit - Progress: Met PT Transfer Goal: Bed to Chair/Chair to Bed - Progress: Met PT Goal: Ambulate - Progress: Progressing toward goal PT Goal: Up/Down Stairs - Progress: Progressing toward goal Additional Goals PT Goal: Additional Goal #1 - Progress: Progressing toward goal  Visit Information  Last PT Received On: 06/12/11 Assistance Needed: +1    Subjective Data  Subjective: My feet swelling have gone down a lot.    Cognition  Overall Cognitive Status: Appears within functional limits for tasks assessed/performed Arousal/Alertness: Awake/alert Orientation Level: Appears intact for tasks assessed Behavior During Session: [facility] for tasks performed    Balance  Standardized Balance Assessment Standardized Balance Assessment: Dynamic Gait Index Dynamic Gait Index Level Surface: Mild Impairment Change in Gait Speed: Mild Impairment Gait with Horizontal Head Turns: Mild Impairment Gait with Vertical Head Turns: Mild Impairment Gait and Pivot Turn: Normal Step Over Obstacle: Mild Impairment Step Around Obstacles: Mild Impairment Steps: Moderate Impairment Total Score: 16   End of Session PT - End of Session Equipment Utilized During Treatment: Gait belt Activity Tolerance: Patient tolerated treatment well Patient left: in bed;with call bell/phone within reach    [facility] HELEN 06/12/2011, 2:45 PM

## 2011-06-12 NOTE — Progress Notes (Signed)
Daily Progress Note [name]. Daniel House, M.D., M.B.A  Family Medicine PGY-1 Pager [phone]  Subjective:  Overnight: no events  Daniel House sitting on bed eating. No complaints this AM. States sore on sacrum is much less painful.   - hgb stable at 7.7, creatinine slightly improved to 4.97, otherwise stable    Objective: Vital signs in last 24 hours: Temp:  [97.6 F (36.4 C)-98.5 F (36.9 C)] 98.5 F (36.9 C) (05/23 0551) Pulse Rate:  [94-96] 96  (05/23 0551) Resp:  [18] 18  (05/23 0551) BP: (127-129)/(78-86) 127/78 mmHg (05/23 0551) SpO2:  [99 %-100 %] 99 % (05/23 0551) Weight:  [102 lb 11.8 oz (46.6 kg)] 102 lb 11.8 oz (46.6 kg) (05/23 0551) Weight change: -11 lb 0.4 oz (-5 kg) Last BM Date: 06/11/11  Filed Weights   06/10/11 0930 06/11/11 0516 06/12/11 0551  Weight: 113 lb 12.1 oz (51.6 kg) 109 lb 9.1 oz (49.7 kg) 102 lb 11.8 oz (46.6 kg)   Intake/Output from previous day: 05/22 0701 - 05/23 0700 In: 720 [P.O.:720] Out: 2545 [Urine:2545] Intake/Output this shift: Total I/O In: 120 [P.O.:120] Out: -  Physical Exam  Constitutional: middle age AAM; non-distressed cachectic, very relaxed and pleasant   Cardiovascular: Regular rhythm and normal heart sounds. flow murmur heard. Tachycardic.  Respiratory: normal work of breathing. No rales He has no wheezes. He exhibits no tenderness.  GI: Soft. He exhibits no distension.  Musculoskeletal: Normal range of motion. He exhibits no edema and no tenderness.  Neurological: He is alert and oriented to person, place, and time. CN2-12 grossly intact. Lower Extremities: 2+ pitting edema of feet Skin: sacral ulcer healing with half-coverage of new tissue  Lab Results:  Basename 06/12/11 0635 06/11/11 0500 06/10/11 0609  WBC -- -- 3.0*  HGB 8.5* 7.7* --  HCT 24.9* 22.6* --  PLT -- -- 405*   BMET  Basename 06/12/11 0635 06/11/11 0500  NA 132* 132*  K 3.4* 3.7  CL 97 100  CO2 21 22  GLUCOSE 118* 121*  BUN 54* 55*    CREATININE 4.64* 4.97*  CALCIUM 9.1 8.8    Studies/Results: Lower Extremity Dopplers: no DVT   Medications:  Scheduled:    . abacavir  600 mg Oral Daily  . atovaquone  1,500 mg Oral Daily  . azithromycin  1,200 mg Oral Q7 days  . darbepoetin (ARANESP) injection - NON-DIALYSIS  100 mcg Subcutaneous Q Mon-1800  . darunavir  800 mg Oral Q breakfast  . enoxaparin (LOVENOX) injection  30 mg Subcutaneous Q24H  . fluconazole  100 mg Oral Weekly  . furosemide  80 mg Oral BID  . lamiVUDine  100 mg Oral Daily  . lidocaine  1 patch Transdermal Q24H  . megestrol  400 mg Oral Daily  . mirtazapine  15 mg Oral QHS  . mulitivitamin with minerals  1 tablet Oral Daily  . pantoprazole  20 mg Oral Q1200  . potassium chloride  20 mEq Oral Once  . ritonavir  100 mg Oral Q breakfast  . DISCONTD: furosemide  160 mg Oral BID   AVW:[id number], acetaminophen, albuterol, calcium carbonate, promethazine, traMADol  Assessment/Plan: 36 yo male with h/o newly diagnosed HIV/AIDS (CD4 count of 20 on 05/22/11) who presents with worsening shortness of breath, weakness and intermittent chest pain and who was admitted for hyperkalemia, acute renal failure, metabolic acidosis, and now pancytopenia with hemolysis s/p 2 units of prbc now stable.    # Acute renal failure: likely secondary to combination of  poor oral intake and low volume status as well as recent use of bactrim, naproxen and truvada.  Renal US negative for obstruction. FeNa of 5 suggests ATN.  - Creatinine 4.97 --> 4.64 on lasix 160mg  PO BID = 2 days of downward trend!  - renal recommends furosmide 80 mg BID and will sign off; truly appreciative of all their help  # Hyperkalemia: Resolved, s/p calcium gluconate, D50, insulin, sodium bicarb in the ED.  kayexalate x 1 on 5/13.  # Hypkalemia: secondary to lasix; give 20 mEq x 1  # Metabolic Acidosis: likely secondary to renal failure. Resolved s/p isotonic bicarb fluids. Recurred 5/16 and given  50 meq bicarb x 2 and resolved - resolved   # Anemia, Normocytic: Unclear etiology, but most likely anemia of chronic disease; However Hgb Dropped from 10.8  To 7.8 to 5.8, the 2 units PRBC on 5/17; hemolysis labs show elevated LDH and haptoglobin, so not clearly intravascular; No known source of active bleeding and GI bleed unlikely if FOB negative; Fe level normal and low TIBC so no microcytic component; B12 normal so no clear macrocytic component.    Lab Results  Component Value Date   HGB 8.5* 06/12/2011   - stable, trend and transfuse @ 7.0 or lower  # Hyponatremia: stable   # Pulmonary Edema: 5/17, resolved with Lasix   # Thrombocytopenia: decrease since early May. No petechia or bruising on exam. INR 1.27 with mildly elevated PT at 16.2. PTT normal. Increased from 53 to 245.  - resolved  # HIV/AIDS: CD4 count of 20 on 05/22/11 and viral load 837K on 05/03/11. Repeat counts have mildly improved: viral load: 3667; CD4 count: 50 - continue current antiviral therapy per ID: abacavir, darunavir, lamivudine, ritonavir (zidovudine d/c'd on 5/17 for concern for myelosuppression; truvada d/c'd 5/11 for concern about AKI) - continue azithromycin for MAC prophylaxis - Continue atovaquone for PCP prophylaxis (Dapsone stopped for concern for hemolytic anemia) - May need assistance from ID social worker regarding new meds that patient has been started on since admission; do these need approval before d/c like original regimen?  # Chest Pain - Acute onset 5/18, differential diagnosis includes MI, PE, pericarditis, MSK pain, GERD, pleuritis pain from known pleural effusion or candidal esophagitis; MI ruled out: troponin elevated, but in setting of renal failure not as accurate; EKG showed NSR x 2; PE - not likely 2/2 negative dopplers for DVT; possibly a pericarditis, but cannot take NSAIDS 2/2 to kidney function - Improved today, continue to follow  # Malnutrition: low albumin and cachectic on  exam, secondary to AIDS wasting  - continue megace and mirtazapine  - regular diet  # Sacral Ulcer: seen by wound care who does not believe that it is decubitus but rather a sequelae of the HIV  - much improved, continue aquacel dressing and sitting on pillow   # Fen/GI:  - SLIV - Regular diet   # Prophylaxis:  - Lovenox - PPI  # Dispo: Given that renal has signed off and pt has stable kidney function and hgb, likely d/c when has new ID meds confirmed; possibly 5/23 but hopefully 5/24   LOS: 13 days   Mat Carne MD 06/12/2011, 1:50 PM

## 2011-06-12 NOTE — Progress Notes (Signed)
Family Medicine Teaching Service Attending Note  I interviewed and examined patient Daniel House and reviewed their tests and x-rays.  I discussed with Dr. [name] and reviewed their note for today.  I agree with their assessment and plan.     Additionally  Feels well and thinks he is ready to go home Up walking in hall and even going up steps Labs stabilized [facility] for discharge once home health needs and medications organized Close follow up at [facility] and ID

## 2011-06-12 NOTE — Progress Notes (Addendum)
[facility] - PROGRESS NOTE Resident Note   Please see below for attending addendum to resident note.  Subjective:   Weight is down 7 pounds since yesterday.  Urine output 2.5L in past 24 hrs.  Cr trending down from 4.9-->4.6. Na is low at 132. K 3.4.  Phos 2.6-->3.3. He reports doing ok, still has occasional shortness of breath on exertion.  He continues to drink A LOT of water.   Objective:    Vital Signs:   Temp:  [97.6 F (36.4 C)-98.5 F (36.9 C)] 98.5 F (36.9 C) (05/23 0551) Pulse Rate:  [94-96] 96  (05/23 0551) Resp:  [18] 18  (05/23 0551) BP: (127-129)/(78-86) 127/78 mmHg (05/23 0551) SpO2:  [99 %-100 %] 99 % (05/23 0551) Weight:  [102 lb 11.8 oz (46.6 kg)] 102 lb 11.8 oz (46.6 kg) (05/23 0551) Last BM Date: 06/11/11  24-hour weight change: Weight change: -11 lb 0.4 oz (-5 kg)  Weight trends: Filed Weights   06/10/11 0930 06/11/11 0516 06/12/11 0551  Weight: 113 lb 12.1 oz (51.6 kg) 109 lb 9.1 oz (49.7 kg) 102 lb 11.8 oz (46.6 kg)   Intake/Output:  05/22 0701 - 05/23 0700 In: 720 [P.O.:720] Out: 2545 [Urine:2545]  Physical Exam:  General:  Vital signs reviewed and noted. Thin appearing, in no acute distress; alert, appropriate and cooperative throughout examination. JVD 3cm (flat position)   Lungs:  Normal respiratory effort. Clear to auscultation BL without crackles or wheezes.   Heart:  RRR. S1 and S2 normal without gallop, +systolic murmur, or rubs.   Abdomen:  BS normoactive. Soft, flat, Nondistended, non-tender.   Extremities:  +1 pitting edema now mostly on feet   (significant improvement)    Labs: Basic Metabolic Panel:  Lab 06/12/11 1610 06/11/11 0500 06/10/11 0609 06/09/11 0630 06/08/11 0547 06/06/11 0640  NA 132* 132* 135 135 133* --  K 3.4* 3.7 4.0 4.2 4.5 --  CL 97 100 103 104 104 --  CO2 21 22 20 19  17* --  GLUCOSE 118* 121* 97 93 95 --  BUN 54* 55* 57* 59* 61* --  CREATININE 4.64* 4.97* 5.27* 5.24* 5.14* --  CALCIUM 9.1 8.8  9.0 -- -- --  MG -- -- -- -- -- --  PHOS 3.3 2.6 -- -- -- 3.4   Liver Function Tests:  Lab 06/12/11 0635 06/11/11 0500 06/06/11 0640  AST -- -- --  ALT -- -- --  ALKPHOS -- -- --  BILITOT -- -- --  PROT -- -- --  ALBUMIN 2.3* 2.2* 1.8*   CBC:  Lab 06/11/11 0500 06/10/11 0609 06/09/11 0630 06/08/11 0904 06/07/11 0457 06/06/11 0644  WBC -- 3.0* 3.3* 3.7* 3.8* 4.1  NEUTROABS -- -- -- -- -- --  HGB 7.7* 7.9* 7.8* 8.4* 9.2* --  HCT 22.6* 23.1* 22.8* 24.7* 27.4* --  MCV -- 90.2 89.4 89.5 89.0 95.3  PLT -- 405* 360 314 245 210     Medications:    Infusions:  . sodium chloride 20 mL/hr at 06/05/11 1800    Scheduled Medications:    . abacavir  600 mg Oral Daily  . atovaquone  1,500 mg Oral Daily  . azithromycin  1,200 mg Oral Q7 days  . darbepoetin (ARANESP) injection - NON-DIALYSIS  100 mcg Subcutaneous Q Mon-1800  . darunavir  800 mg Oral Q breakfast  . enoxaparin (LOVENOX) injection  30 mg Subcutaneous Q24H  . fluconazole  100 mg Oral Weekly  . furosemide  160 mg Oral BID  .  lamiVUDine  100 mg Oral Daily  . lidocaine  1 patch Transdermal Q24H  . megestrol  400 mg Oral Daily  . mirtazapine  15 mg Oral QHS  . mulitivitamin with minerals  1 tablet Oral Daily  . pantoprazole  20 mg Oral Q1200  . ritonavir  100 mg Oral Q breakfast    PRN Medications: acetaminophen, acetaminophen, albuterol, calcium carbonate, promethazine, traMADol   Assessment/ Plan:    1. Acute renal failure with hyperkalemia: likely ATN 2/2 Bactrim/Tenofovir/nonsteroidal anti-inflammatory drug therapy/Truvada. (Baseline creatinine around 1 on May 22, 2011; ANA and ANCA negative, normal ACE level,about 885 mg proteinuria; creatinine on May 10 was 3.9). Cr this has started to trend down from 5.27 to 4.97 -->4.64. He continues to have great urine output of 2.5L yesterday with Lasix 160mg  po bid. Could decrease to 80 BID and folloow exam; His weight was fluctuating but steadily decreased in the past 4-5  days:  95-->123-->105-->131-->125-->120-->122-->113-->109-->102.  2. Dyspnea- due to pulm edema, see 5/16 cxr. Intermittently symptomatic. But lung exam is clear. 3. Hyponatremia: Na 132, likely hypervolemic hyponatremia. Continue diuresis 4. Pneumocystis pneumonia: TMP/SMX stopped and patient was switched to Dapsone (due to concerns with hyperkalemia/acute renal failure). Continue dapsone.  5. HIV/AIDS: Anti-retroviral therapy adjustments per infectious disease: Prezista, Diflucan, Lamivudine, Norvir, and Abacavir. Appreciate ID's input. 6. Anemia- chronic disease likely due to HIV vs. Hemolysis; although is haptoglobin is elevated and G6PD is normal; LDH is elevated at 481. Hb trends 8.4-->7.8-->7.9-->7.7-->8.5. Anemia panel shows Ferritin 2453, iron 56, sat ratio 38, folate 12.5, and vitamin B12 304. Continue aranesp.  7. Metabolic acidosis: resolved.  8. Chest pain: resolved. likely GERD/MSK. Consider discontinue sucralfate. LE Venous Doppler negative for DVT. Slightly elevated Tro likely due to kidney failure. 9. Malnutrition - with potassium of 3.4 and Phos 3.3 after liberalizing diet with low Na & Phos. . Will continue current diet  10. Hypokalemia- mild, 2/2 diuresis. Give supplement 68mEqx1 11. Dispo- pending clinical improvement, in next few days   Length of Stay: 13 days  Patient history and plan of care reviewed with attending, Dr. Wardell Honour, Internal Medicine Resident 06/12/2011, 8:32 AM I have seen and examined this patient and agree with plan as above with highlighted additions. Renal function clearly improving, edema better.  Could decrease Lasix to 80 BID.  Plans for D/C next couple of days noted.  Would recommend labs at least 2X/week in [facility] clinic until renal function stabilizes (hopefully at previous baseline of 1.0).  Pt now on regular diet - OK with that for d/c.  Will sign off - please call if additional assistance is needed. He has received Aranesp in the  hospital (next dose would be due on Monday) If renal improves to baseline may well not need long term dosing.  CBC will need to be monitored in the outpt setting as well to make this determination. Markavious Micco B,MD 06/12/2011 11:30 AM

## 2011-06-13 ENCOUNTER — Other Ambulatory Visit: Payer: Self-pay

## 2011-06-13 ENCOUNTER — Other Ambulatory Visit: Payer: Self-pay | Admitting: Family Medicine

## 2011-06-13 DIAGNOSIS — N179 Acute kidney failure, unspecified: Secondary | ICD-10-CM

## 2011-06-13 LAB — BASIC METABOLIC PANEL
BUN: 56 mg/dL — ABNORMAL HIGH (ref 6–23)
CO2: 27 mEq/L (ref 19–32)
Chloride: 99 mEq/L (ref 96–112)
Creat: 4.36 mg/dL — ABNORMAL HIGH (ref 0.50–1.35)
Glucose, Bld: 87 mg/dL (ref 70–99)
Potassium: 3.8 mEq/L (ref 3.5–5.3)

## 2011-06-13 NOTE — Progress Notes (Signed)
BMP AND CBC DONE TODAY Kaida Games 

## 2011-06-14 ENCOUNTER — Telehealth: Payer: Self-pay | Admitting: Family Medicine

## 2011-06-14 DIAGNOSIS — N179 Acute kidney failure, unspecified: Secondary | ICD-10-CM

## 2011-06-14 LAB — CBC
HCT: 29.4 % — ABNORMAL LOW (ref 39.0–52.0)
Hemoglobin: 9.6 g/dL — ABNORMAL LOW (ref 13.0–17.0)
MCV: 94.2 fL (ref 78.0–100.0)
WBC: 5.6 10*3/uL (ref 4.0–10.5)

## 2011-06-14 NOTE — Telephone Encounter (Signed)
I called Daniel House to inform him that his creatinine and hemoglobin were both improving. He was very excited to hear that. Otherwise, he was in no distress. He will come to the clinic again for repeat BMET and CBC on Tuesday May 28th.

## 2011-06-17 ENCOUNTER — Other Ambulatory Visit: Payer: Self-pay

## 2011-06-17 DIAGNOSIS — N179 Acute kidney failure, unspecified: Secondary | ICD-10-CM

## 2011-06-17 NOTE — Progress Notes (Signed)
BMP AND CBC DONE TODAY Daniel House 

## 2011-06-18 ENCOUNTER — Telehealth: Payer: Self-pay | Admitting: Family Medicine

## 2011-06-18 LAB — BASIC METABOLIC PANEL
BUN: 41 mg/dL — ABNORMAL HIGH (ref 6–23)
Calcium: 8.7 mg/dL (ref 8.4–10.5)
Creat: 2.92 mg/dL — ABNORMAL HIGH (ref 0.50–1.35)
Glucose, Bld: 104 mg/dL — ABNORMAL HIGH (ref 70–99)

## 2011-06-18 LAB — CBC
MCV: 100.3 fL — ABNORMAL HIGH (ref 78.0–100.0)
Platelets: 506 10*3/uL — ABNORMAL HIGH (ref 150–400)
RBC: 2.88 MIL/uL — ABNORMAL LOW (ref 4.22–5.81)
RDW: 23.9 % — ABNORMAL HIGH (ref 11.5–15.5)
WBC: 3.9 10*3/uL — ABNORMAL LOW (ref 4.0–10.5)

## 2011-06-18 NOTE — Telephone Encounter (Signed)
Lab Results  Component Value Date   CREATININE 2.92* 06/17/2011   CBC    Component Value Date/Time   WBC 3.9* 06/17/2011 1413   RBC 2.88* 06/17/2011 1413   HGB 9.4* 06/17/2011 1413   HCT 28.9* 06/17/2011 1413   PLT 506* 06/17/2011 1413   MCV 100.3* 06/17/2011 1413   MCH 32.6 06/17/2011 1413   MCHC 32.5 06/17/2011 1413   RDW 23.9* 06/17/2011 1413   LYMPHSABS 0.6* 06/01/2011 1838   MONOABS 0.2 06/01/2011 1838   EOSABS 0.0 06/01/2011 1838   BASOSABS 0.0 06/01/2011 1838    I called Mr. Steinhoff to tell him the good news that his creatinine is trending down and hemoglobin is stable. He was pleased to hear this. Given the trend, there is no need for him to return for lab draws prior to his regularly scheduled visit with me on 06/23/11. I will contact our schedulers and ask them to cancel the appointment on 06/19/11.    E.V. [name], MD

## 2011-06-18 NOTE — Discharge Summary (Signed)
I have reviewed this discharge summary and agree.    

## 2011-06-19 ENCOUNTER — Other Ambulatory Visit: Payer: Self-pay

## 2011-06-23 ENCOUNTER — Encounter: Payer: Self-pay | Admitting: Family Medicine

## 2011-06-23 ENCOUNTER — Ambulatory Visit (INDEPENDENT_AMBULATORY_CARE_PROVIDER_SITE_OTHER): Payer: Self-pay | Admitting: Family Medicine

## 2011-06-23 VITALS — BP 135/98 | HR 94 | Ht 72.0 in | Wt 113.0 lb

## 2011-06-23 DIAGNOSIS — L89152 Pressure ulcer of sacral region, stage 2: Secondary | ICD-10-CM

## 2011-06-23 DIAGNOSIS — L8992 Pressure ulcer of unspecified site, stage 2: Secondary | ICD-10-CM

## 2011-06-23 DIAGNOSIS — L89109 Pressure ulcer of unspecified part of back, unspecified stage: Secondary | ICD-10-CM

## 2011-06-23 DIAGNOSIS — R64 Cachexia: Secondary | ICD-10-CM

## 2011-06-23 DIAGNOSIS — N179 Acute kidney failure, unspecified: Secondary | ICD-10-CM

## 2011-06-23 DIAGNOSIS — L8990 Pressure ulcer of unspecified site, unspecified stage: Secondary | ICD-10-CM

## 2011-06-23 DIAGNOSIS — B20 Human immunodeficiency virus [HIV] disease: Secondary | ICD-10-CM

## 2011-06-23 LAB — CBC
Hemoglobin: 10.3 g/dL — ABNORMAL LOW (ref 13.0–17.0)
RBC: 3.16 MIL/uL — ABNORMAL LOW (ref 4.22–5.81)
WBC: 4.2 10*3/uL (ref 4.0–10.5)

## 2011-06-23 LAB — BASIC METABOLIC PANEL
CO2: 19 mEq/L (ref 19–32)
Glucose, Bld: 90 mg/dL (ref 70–99)
Potassium: 4.5 mEq/L (ref 3.5–5.3)
Sodium: 141 mEq/L (ref 135–145)

## 2011-06-23 MED ORDER — AZITHROMYCIN 600 MG PO TABS: 1200.0000 mg | ORAL_TABLET | ORAL | Status: DC

## 2011-06-23 NOTE — Assessment & Plan Note (Signed)
Patient compliant with regimen of abacavir, darunavir, lamivudine, and rintoavir. Continue as directed by Dr. Luciana Axe. Follow-up 6/13. - Patient continuing atovaquone for PCP, which is directed by ID doc as well - Needs refill of azithromycin for MAC prophylaxis so I will send those today

## 2011-06-23 NOTE — Assessment & Plan Note (Signed)
Body mass index is 15.33 kg/(m^2). Previous BMI 14.   Improving weight and BMI. Stop mirtazepine and megace as not needed and may cause unwanted side effects.

## 2011-06-23 NOTE — Patient Instructions (Signed)
Dear Daniel House,   Thank you for coming to clinic today. Please read below regarding the issues that we discussed.   1. Medication Management - You no longer need to take the medication for your appetite, because you are gaining weight and doing well. I have sent a refill for your weekly antibiotic, which prevents infections.   2. Infectious Disease Appointment - This is June 13 at 11:00 AM with Dr. Luciana Axe. His office is located in  (ask the nurse)   3. Lab Results - I will call you with results.   Please follow up in clinic in 4 weeks . Please call earlier if you have any questions or concerns.   Sincerely,   Dr. [name]

## 2011-06-23 NOTE — Assessment & Plan Note (Signed)
Well healing. No longer needs dressing as it is mostly covered by new skin. Continue to use cushion PRN. Expect full healing within 4 weeks.

## 2011-06-23 NOTE — Progress Notes (Signed)
  Subjective:    Patient ID: Daniel House, male    DOB: [birth year], 36 y.o.   MRN: [id number]  HPI  Hospital Follow Up for acute renal failure caused by medication - bactrim, truvada, and naproxen  Frequent urine output, clear in color; Patient notes getting stronger - able to get out of chair without using his arms; still gets short of breath when doing activity, but feels better; has good appetite and does not think that he needs appetite stimulants any longer; decreased pain in sacral area; currently taking meds as prescribed but needs refill on azithromycin; denies being currently sexually active and is going to use protection for himself and partner in the future   Review of Systems See HPI    Objective:   Physical Exam BP 135/98  Pulse 94  Ht 6' (1.829 m)  Wt 113 lb (51.256 kg)  BMI 15.33 kg/m2 Vital signs noteworthy for weight gain Gen: young AAM, cachetic, very pleasant, non distressed Lungs: CTA-B, normal work of breathing Cardiac: RRR, no murmurs MSK: able to easily transition from chair to exam table Skin: healing stage 2 sacral decubitus ulcer      Assessment & Plan:  36 year old male with Dx of HIV/AID and PCP pneumonia in April 2013, who suffered acute renal failure from the antibiotics, NSAID and HIV medications which required hospitalization for 13 days in May 2013. He appears to be recovering very well.

## 2011-06-23 NOTE — Assessment & Plan Note (Signed)
Creatinine trending down to 2.92 (make  > 5.2 in hospital). Recheck today and continue to trend. Expect full recovery.

## 2011-06-24 ENCOUNTER — Telehealth: Payer: Self-pay | Admitting: Family Medicine

## 2011-06-24 NOTE — Telephone Encounter (Signed)
I called Mr. Daniel House to inform him that his hemoglobin has continued to trend up and creatinine has continued to trend down. We are both very pleased with the improvement.  Given the continued improvement, I do not see why he needs to come for any more labs draws this week. If Dr. Luciana Axe is able to add a CBC and BMET to any labs that he plans to order at the infectious disease clinic on 6/13, then that should suffice. I will follow up the results thereafter.   Roslynn Amble, MD

## 2011-07-03 ENCOUNTER — Other Ambulatory Visit: Payer: Self-pay | Admitting: Internal Medicine

## 2011-07-03 ENCOUNTER — Ambulatory Visit (INDEPENDENT_AMBULATORY_CARE_PROVIDER_SITE_OTHER): Payer: MEDICAID | Admitting: Internal Medicine

## 2011-07-03 ENCOUNTER — Encounter: Payer: Self-pay | Admitting: Internal Medicine

## 2011-07-03 VITALS — BP 142/84 | HR 128 | Temp 98.0°F | Ht 72.0 in | Wt 115.0 lb

## 2011-07-03 DIAGNOSIS — B20 Human immunodeficiency virus [HIV] disease: Secondary | ICD-10-CM

## 2011-07-03 DIAGNOSIS — N179 Acute kidney failure, unspecified: Secondary | ICD-10-CM

## 2011-07-03 DIAGNOSIS — B1920 Unspecified viral hepatitis C without hepatic coma: Secondary | ICD-10-CM

## 2011-07-03 LAB — COMPLETE METABOLIC PANEL WITH GFR
AST: 19 U/L (ref 0–37)
Albumin: 4.1 g/dL (ref 3.5–5.2)
BUN: 23 mg/dL (ref 6–23)
CO2: 22 mEq/L (ref 19–32)
Calcium: 10.5 mg/dL (ref 8.4–10.5)
Chloride: 106 mEq/L (ref 96–112)
Creat: 1.6 mg/dL — ABNORMAL HIGH (ref 0.50–1.35)
GFR, Est African American: 63 mL/min
Potassium: 4.6 mEq/L (ref 3.5–5.3)

## 2011-07-03 MED ORDER — RITONAVIR 100 MG PO TABS: 100.0000 mg | ORAL_TABLET | Freq: Every day | ORAL | Status: DC

## 2011-07-03 MED ORDER — LAMIVUDINE 150 MG PO TABS: 150.0000 mg | ORAL_TABLET | Freq: Two times a day (BID) | ORAL | Status: DC

## 2011-07-03 MED ORDER — DARUNAVIR ETHANOLATE 800 MG PO TABS: 800.0000 mg | ORAL_TABLET | Freq: Every day | ORAL | Status: DC

## 2011-07-03 MED ORDER — ATOVAQUONE 750 MG/5ML PO SUSP: 1500.0000 mg | Freq: Every day | ORAL | Status: DC

## 2011-07-03 MED ORDER — ABACAVIR SULFATE 300 MG PO TABS: 600.0000 mg | ORAL_TABLET | Freq: Every day | ORAL | Status: DC

## 2011-07-03 MED ORDER — AZITHROMYCIN 600 MG PO TABS: 1200.0000 mg | ORAL_TABLET | ORAL | Status: DC

## 2011-07-03 NOTE — Patient Instructions (Signed)
Your new dose of lamivudine is 150 mg daily.

## 2011-07-04 ENCOUNTER — Other Ambulatory Visit: Payer: Self-pay | Admitting: Internal Medicine

## 2011-07-04 LAB — CBC WITH DIFFERENTIAL/PLATELET
Basophils Absolute: 0.1 10*3/uL (ref 0.0–0.1)
Eosinophils Relative: 3 % (ref 0–5)
HCT: 31.6 % — ABNORMAL LOW (ref 39.0–52.0)
Hemoglobin: 10.4 g/dL — ABNORMAL LOW (ref 13.0–17.0)
Lymphocytes Relative: 34 % (ref 12–46)
Lymphs Abs: 2.7 10*3/uL (ref 0.7–4.0)
MCV: 98.1 fL (ref 78.0–100.0)
Monocytes Absolute: 0.6 10*3/uL (ref 0.1–1.0)
Monocytes Relative: 7 % (ref 3–12)
Neutro Abs: 4.6 10*3/uL (ref 1.7–7.7)
RBC: 3.22 MIL/uL — ABNORMAL LOW (ref 4.22–5.81)
RDW: 20 % — ABNORMAL HIGH (ref 11.5–15.5)
WBC: 8.2 10*3/uL (ref 4.0–10.5)

## 2011-07-04 LAB — T-HELPER CELL (CD4) - (RCID CLINIC ONLY): CD4 T Cell Abs: 210 uL — ABNORMAL LOW (ref 400–2700)

## 2011-07-04 MED ORDER — ABACAVIR SULFATE-LAMIVUDINE 600-300 MG PO TABS: 1.0000 | ORAL_TABLET | Freq: Every day | ORAL | Status: DC

## 2011-07-06 NOTE — Assessment & Plan Note (Addendum)
He is doing well, no difficulty with swallowing and has excellent compliance.  His renal function has improved and, after the appt, it was even better than previous.  He was then called and will start full dose abacavir and lamivudine in the form of Epzicom with an estimated GFR above 50.  He will take this with the norvir and darunavir.  He will continue PCP prophylaxis with atovaquone and I have told him to stop weekly fluconazole as there is no current indication.  He will continue to be followed closely.  He is eating well and gained some weight.

## 2011-07-06 NOTE — Progress Notes (Signed)
  Subjective:    Patient ID: Daniel House, male    DOB: 10-25-1975, 36 y.o.   MRN: [id number]  HPI  He comes in for follow u of his HIV.  He has been on renally dosed lamivudine along with abacavir, norvir and darunavir and is tolerating those well.  He denies any missed doses and is pleased with his progression.  He denies any recent fevers and has gained a little bit of weight.  No SOB, no diarrhea.  No dysphagia.     Review of Systems  Constitutional: Negative for fever, appetite change, fatigue and unexpected weight change.  HENT: Negative for sore throat and trouble swallowing.   Respiratory: Negative for cough and shortness of breath.   Cardiovascular: Negative for chest pain, palpitations and leg swelling.  Gastrointestinal: Negative for nausea, abdominal pain and diarrhea.  Musculoskeletal: Negative for myalgias, joint swelling and arthralgias.  Skin: Negative for rash.  Hematological: Negative for adenopathy.  Psychiatric/Behavioral: Negative for dysphoric mood. The patient is not nervous/anxious.        Objective:   Physical Exam  Constitutional: No distress.       Thin appearing but no distress  Eyes: No scleral icterus.  Cardiovascular: Normal rate, regular rhythm and normal heart sounds.  Exam reveals no gallop and no friction rub.   No murmur heard. Pulmonary/Chest: Effort normal and breath sounds normal. No respiratory distress. He has no wheezes. He has no rales.  Abdominal: Soft. Bowel sounds are normal. He exhibits no distension. There is no tenderness.  Lymphadenopathy:    He has no cervical adenopathy.  Skin: Skin is warm and dry. No rash noted.  Psychiatric: He has a normal mood and affect. His behavior is normal.          Assessment & Plan:

## 2011-07-06 NOTE — Assessment & Plan Note (Signed)
I will check an RNA to see if active disease.

## 2011-07-06 NOTE — Assessment & Plan Note (Signed)
Improving, likely multifactorial including possibly from tenofovir, bactrim.

## 2011-07-15 LAB — HIV-1 GENOTYPR PLUS

## 2011-07-22 ENCOUNTER — Telehealth: Payer: Self-pay | Admitting: Family Medicine

## 2011-07-22 NOTE — Telephone Encounter (Signed)
Pt is asking for results of his labs.

## 2011-07-22 NOTE — Telephone Encounter (Signed)
Will forward to Dr Williamson 

## 2011-07-23 ENCOUNTER — Other Ambulatory Visit: Payer: Self-pay | Admitting: Internal Medicine

## 2011-07-23 DIAGNOSIS — B20 Human immunodeficiency virus [HIV] disease: Secondary | ICD-10-CM

## 2011-07-23 NOTE — Telephone Encounter (Signed)
I was able to speak with Daniel House and tell him the results of his labs from 07/03/11 when he saw Dr. Luciana Axe. He was excited to hear that his kidney function continues to improve and that his CD4 count increased. He does not have a scheduled follow-up with Dr. Luciana Axe and could not remember when he was supposed to be seen again. I told him that I would contact Dr. Luciana Axe to clarify when his follow-up should be. If Dr. Luciana Axe can let me know, then I am happy to notify Daniel House of his recommendation.

## 2011-07-23 NOTE — Telephone Encounter (Signed)
Thanks.  He should come back at the end of August with the lab work about 2 weeks prior to the appt. Labs are already ordered and he should just call to schedule lab visit and appt with me.

## 2011-07-27 NOTE — Telephone Encounter (Signed)
Please call the patient and relay the message below from Dr. Luciana Axe about when to follow-up for his infectious disease appointment. Thank you very much.

## 2011-09-03 ENCOUNTER — Other Ambulatory Visit: Payer: Self-pay

## 2011-09-12 ENCOUNTER — Ambulatory Visit: Payer: Medicaid Other | Admitting: Family Medicine

## 2011-09-17 ENCOUNTER — Ambulatory Visit: Payer: Medicaid Other | Admitting: Family Medicine

## 2011-09-17 ENCOUNTER — Other Ambulatory Visit (HOSPITAL_COMMUNITY): Payer: Self-pay | Admitting: Family Medicine

## 2011-09-18 ENCOUNTER — Ambulatory Visit (INDEPENDENT_AMBULATORY_CARE_PROVIDER_SITE_OTHER): Payer: Medicaid Other | Admitting: Internal Medicine

## 2011-09-18 ENCOUNTER — Encounter: Payer: Self-pay | Admitting: Internal Medicine

## 2011-09-18 ENCOUNTER — Other Ambulatory Visit: Payer: Self-pay | Admitting: Internal Medicine

## 2011-09-18 VITALS — BP 132/95 | HR 94 | Temp 98.3°F | Ht 72.0 in | Wt 132.0 lb

## 2011-09-18 DIAGNOSIS — R64 Cachexia: Secondary | ICD-10-CM

## 2011-09-18 DIAGNOSIS — B20 Human immunodeficiency virus [HIV] disease: Secondary | ICD-10-CM

## 2011-09-18 DIAGNOSIS — E88A Wasting disease (syndrome) due to underlying condition: Secondary | ICD-10-CM

## 2011-09-18 DIAGNOSIS — N179 Acute kidney failure, unspecified: Secondary | ICD-10-CM

## 2011-09-18 DIAGNOSIS — B1920 Unspecified viral hepatitis C without hepatic coma: Secondary | ICD-10-CM

## 2011-09-18 DIAGNOSIS — Z113 Encounter for screening for infections with a predominantly sexual mode of transmission: Secondary | ICD-10-CM

## 2011-09-18 DIAGNOSIS — M722 Plantar fascial fibromatosis: Secondary | ICD-10-CM | POA: Insufficient documentation

## 2011-09-18 LAB — RPR TITER: RPR Titer: 1:4 {titer}

## 2011-09-18 LAB — RPR: RPR Ser Ql: REACTIVE — AB

## 2011-09-18 NOTE — Progress Notes (Signed)
  Subjective:    Patient ID: Daniel House, male    DOB: July 28, 1975, 36 y.o.   MRN: [id number]  HPI He comes in for followup of his HIV, renal insufficiency and weight. He was last seen 2 months ago and his creatinine had nearly normalized.  He was then switched to Epzicom along with Prezista and Norvir and has been taking that and denies any missed doses. He has had some weight gain and is eating well and denies any fluid retention. He does have some bilateral heel pain that occurs with walking. He has no other complaints. He has stopped taking his Nexium and is having no symptoms of reflux.   Review of Systems  Constitutional: Negative for fever, chills, appetite change and unexpected weight change.  HENT: Negative for sore throat and trouble swallowing.   Respiratory: Negative for cough, shortness of breath and wheezing.   Cardiovascular: Negative for chest pain, palpitations and leg swelling.  Gastrointestinal: Negative for nausea, vomiting, abdominal pain and diarrhea.  Musculoskeletal: Negative for myalgias, joint swelling and arthralgias.  Neurological: Negative for dizziness and headaches.  Hematological: Negative for adenopathy.       Objective:   Physical Exam  Constitutional: He appears well-developed and well-nourished. No distress.  HENT:  Mouth/Throat: Oropharynx is clear and moist. No oropharyngeal exudate.  Cardiovascular: Normal rate, regular rhythm and normal heart sounds.  Exam reveals no gallop and no friction rub.   No murmur heard. Pulmonary/Chest: Effort normal and breath sounds normal. No respiratory distress. He has no wheezes. He has no rales.  Abdominal: Soft. Bowel sounds are normal. He exhibits no distension. There is no tenderness. There is no rebound.  Lymphadenopathy:    He has no cervical adenopathy.  Skin: Skin is warm and dry. No rash noted.          Assessment & Plan:

## 2011-09-18 NOTE — Assessment & Plan Note (Signed)
He does have a diagnosis of a positive hepatitis C antibody. I will check the RNA to see if it is active disease. I did discuss with him that with active hepatitis, he will need to abstain from alcohol.

## 2011-09-18 NOTE — Assessment & Plan Note (Signed)
He does describe some symptoms of heel pain bilaterally with activity that are suspicious for plantar fasciitis. I discussed with him doing stretching exercises. He will also discuss this with his primary physician if this is not effective.

## 2011-09-18 NOTE — Assessment & Plan Note (Signed)
He is doing well with his regimen as far as tolerance. I will recheck his labs today to assure that he is having good suppression of his virus. He knows to use condoms with sexual activity. He will continue with the current regimen if he continues to have good suppression. He also will likely be able to stop his Mepron and is azithromycin if his CD4 count remains over 200. I have told him to stop his fluconazole in the past.  If his labs are reassuring, he will return in 3 months but otherwise will be called for any changes.

## 2011-09-18 NOTE — Assessment & Plan Note (Signed)
He is continuing to have good weight gain and is eating well. This will be monitored

## 2011-09-18 NOTE — Assessment & Plan Note (Signed)
I will again check his creatinine to assure that his current dose of his medications are appropriate. He is having no symptoms of urinary issues or fluid retention.

## 2011-09-19 LAB — T.PALLIDUM AB, TOTAL: T pallidum Antibodies (TP-PA): >8 S/CO — ABNORMAL HIGH (ref <0.90)

## 2011-09-23 LAB — HEPATITIS C RNA QUANTITATIVE

## 2011-09-30 ENCOUNTER — Ambulatory Visit: Payer: Medicaid Other | Admitting: Family Medicine

## 2011-10-01 ENCOUNTER — Telehealth: Payer: Self-pay | Admitting: Family Medicine

## 2011-10-01 NOTE — Telephone Encounter (Signed)
Attempted to call patient to see why he has missed the last 3 appointments with me as he never missed an appointment previously. His phone number was busy on several occasions.

## 2011-10-07 ENCOUNTER — Other Ambulatory Visit: Payer: Self-pay | Admitting: *Deleted

## 2011-10-07 DIAGNOSIS — Z113 Encounter for screening for infections with a predominantly sexual mode of transmission: Secondary | ICD-10-CM

## 2011-10-14 ENCOUNTER — Other Ambulatory Visit: Payer: Self-pay | Admitting: Family Medicine

## 2011-10-14 ENCOUNTER — Encounter: Payer: Self-pay | Admitting: Family Medicine

## 2011-10-14 ENCOUNTER — Ambulatory Visit (INDEPENDENT_AMBULATORY_CARE_PROVIDER_SITE_OTHER): Payer: Medicaid Other | Admitting: Family Medicine

## 2011-10-14 VITALS — BP 123/83 | HR 100 | Temp 99.3°F | Ht 72.0 in | Wt 132.0 lb

## 2011-10-14 DIAGNOSIS — R64 Cachexia: Secondary | ICD-10-CM

## 2011-10-14 DIAGNOSIS — Z23 Encounter for immunization: Secondary | ICD-10-CM

## 2011-10-14 DIAGNOSIS — L89109 Pressure ulcer of unspecified part of back, unspecified stage: Secondary | ICD-10-CM

## 2011-10-14 DIAGNOSIS — B20 Human immunodeficiency virus [HIV] disease: Secondary | ICD-10-CM

## 2011-10-14 DIAGNOSIS — L89152 Pressure ulcer of sacral region, stage 2: Secondary | ICD-10-CM

## 2011-10-14 DIAGNOSIS — N179 Acute kidney failure, unspecified: Secondary | ICD-10-CM

## 2011-10-14 DIAGNOSIS — L8992 Pressure ulcer of unspecified site, stage 2: Secondary | ICD-10-CM

## 2011-10-14 DIAGNOSIS — L8990 Pressure ulcer of unspecified site, unspecified stage: Secondary | ICD-10-CM

## 2011-10-14 DIAGNOSIS — Z21 Asymptomatic human immunodeficiency virus [HIV] infection status: Secondary | ICD-10-CM

## 2011-10-14 LAB — CBC WITH DIFFERENTIAL/PLATELET
Basophils Absolute: 0 10*3/uL (ref 0.0–0.1)
Basophils Relative: 1 % (ref 0–1)
Eosinophils Relative: 2 % (ref 0–5)
HCT: 36.1 % — ABNORMAL LOW (ref 39.0–52.0)
MCH: 34.5 pg — ABNORMAL HIGH (ref 26.0–34.0)
MCHC: 34.6 g/dL (ref 30.0–36.0)
MCV: 99.7 fL (ref 78.0–100.0)
Monocytes Absolute: 0.4 10*3/uL (ref 0.1–1.0)
RDW: 13.4 % (ref 11.5–15.5)

## 2011-10-14 MED ORDER — TETANUS-DIPHTH-ACELL PERTUSSIS 5-2.5-18.5 LF-MCG/0.5 IM SUSP: 0.5000 mL | Freq: Once | INTRAMUSCULAR | Status: DC

## 2011-10-14 NOTE — Progress Notes (Signed)
  Subjective:    Patient ID: Daniel House, male    DOB: 01/03/1976, 36 y.o.   MRN: [id number]  HPI  36 year old male with HIV, and history of PCP pneumonia, syphilis, and acute renal failure presents for a routine followup. He currently states he feels much better he continues to get stronger. He denies any decreased appetite, but does note abdominal pain when he takes his azithromycin twice a week for prophylaxis. He also asks whether or not he can stop taking it. He says that he is compliant with his HIV medications, which are managed by Dr. Luciana Axe with the infectious disease clinic. He last saw Dr. Luciana Axe in the end of August. According to records,  Dr. Luciana Axe intended to drill several labs including a CD4 count. However the patient does nothing do these labs were drawn, and if so he does not have the results.  Review of Systems Negative for dyspnea, chest pain, diarrhea, skin lesions or abrasions    Objective:   Physical Exam BP 123/83  Pulse 100  Temp 99.3 F (37.4 C) (Oral)  Ht 6' (1.829 m)  Wt 132 lb (59.875 kg)  BMI 17.90 kg/m2 Gen. After American male, very thin body habitus but much less prominent facial cachexia Cardiovascular: regular rate and rhythm, no murmurs Pulmonary: lungs clear to auscultation bilaterally Extremities: no peripheral edema Skin: well-healed sacral ulcer, no new lesions     Assessment & Plan:  HIV positive male here for follow up on renal failure and medical management.

## 2011-10-14 NOTE — Assessment & Plan Note (Signed)
Markedly improved with the BMI now over 17. The patient said he has always been extremely thin, thus I will not be very concerned that his BMI does not continue to increase from this point forward. Discontinue Protonix.

## 2011-10-14 NOTE — Assessment & Plan Note (Signed)
Well-healed and no ulcer present.

## 2011-10-14 NOTE — Assessment & Plan Note (Signed)
He needs the following labs drawn: HIV viral load, CD4, CBC, metabolic panel. I spoke to Dr. Luciana Axe with infectious disease and he is agreeable with my plan to draw his labs today. Depending on results of his CD4 count, we may be able to discontinue azithromycin.

## 2011-10-14 NOTE — Patient Instructions (Addendum)
Dear Mr. Astorga,   Thank you for coming to clinic today. Please read below regarding the issues that we discussed.   1. Labs - I will give you a call with the results in the next 2-3 days. Hopefully, we can take you off the azithromycin.   2. Vaccines - Today you go the flu and tetanus vaccines.   Please follow up in clinic in 6 months. Please call earlier if you have any questions or concerns.   Sincerely,   Dr. [name]

## 2011-10-15 LAB — COMPREHENSIVE METABOLIC PANEL
ALT: 17 U/L (ref 0–53)
AST: 18 U/L (ref 0–37)
Chloride: 104 mEq/L (ref 96–112)
Creat: 1.26 mg/dL (ref 0.50–1.35)
Total Bilirubin: 0.4 mg/dL (ref 0.3–1.2)

## 2011-10-20 ENCOUNTER — Telehealth: Payer: Self-pay | Admitting: Family Medicine

## 2011-10-20 NOTE — Telephone Encounter (Signed)
Forward to Dr [name], not sure if you called this patient?

## 2011-10-20 NOTE — Telephone Encounter (Signed)
Patient is returning call to Dr. [name].

## 2011-10-21 ENCOUNTER — Encounter: Payer: Self-pay | Admitting: Family Medicine

## 2011-10-21 NOTE — Telephone Encounter (Signed)
I called the patient, and he was not available. However, I left a message letting him know that he could stop taking the Azithromycin now, because his CD4 count is > 200. I will also mail him a letter with the results.

## 2012-04-28 ENCOUNTER — Encounter: Payer: Self-pay | Admitting: *Deleted

## 2012-06-25 ENCOUNTER — Ambulatory Visit (HOSPITAL_COMMUNITY)
Admission: RE | Admit: 2012-06-25 | Discharge: 2012-06-25 | Disposition: A | Discharge status: Home / Self Care | Payer: Medicaid Other | Source: Ambulatory Visit | Attending: Family Medicine | Admitting: Family Medicine

## 2012-06-25 ENCOUNTER — Encounter: Payer: Self-pay | Admitting: Family Medicine

## 2012-06-25 ENCOUNTER — Ambulatory Visit (INDEPENDENT_AMBULATORY_CARE_PROVIDER_SITE_OTHER): Payer: Medicaid Other | Admitting: Family Medicine

## 2012-06-25 VITALS — BP 155/82 | HR 90 | Temp 98.2°F | Ht 72.0 in | Wt 137.0 lb

## 2012-06-25 DIAGNOSIS — M5030 Other cervical disc degeneration, unspecified cervical region: Secondary | ICD-10-CM | POA: Insufficient documentation

## 2012-06-25 DIAGNOSIS — M542 Cervicalgia: Secondary | ICD-10-CM

## 2012-06-25 MED ORDER — MELOXICAM 15 MG PO TABS: 15.0000 mg | ORAL_TABLET | Freq: Every day | ORAL | Status: DC

## 2012-06-25 MED ORDER — CYCLOBENZAPRINE HCL 10 MG PO TABS: 10.0000 mg | ORAL_TABLET | Freq: Three times a day (TID) | ORAL | Status: DC | PRN

## 2012-06-25 NOTE — Progress Notes (Signed)
  Subjective:    Patient ID: Daniel House, male    DOB: 05-14-1975, 37 y.o.   MRN: [id number]  HPI # SDA: left neck and shoulder pain  It has been bothering him for 2 months   Inciting event: he slipped on the couch one night, he caught a crook in his neck and it has been there since  Description: constant sharp pain goes up neck and up his arm  Sometimes his arm feels numb Progression: worsening Exacerbated by: laying down on that shoulder Alleviated by: laying down on his right side  Medications tried: -Alleve -Tylenol -creams, analgesic Nothing helps  Review of Systems Per HPI Denies tingling/numbness fingertips, headache, chest pain, dyspnea, fevers/chills  Allergies, medication, past medical history reviewed.  Smoking status noted. HIV, AIDS--ID clinic Hepatitis C Syphilis      Objective:   Physical Exam GEN: NAD; thin; well-appearing HEENT:   Head: [place]/AT   Eyes: normal conjunctiva without injection or tearing   Nose: no rhinorrhea, normal turbinates   Mouth: MMM NECK: no LAD; tender along L superior trapezius; intact ROM bilaterally with pain worse with full rotation of head to the right NEURO: 2+ radial pulses; sensation intact; 5/5 strength upper extremities bilaterally (including grip strength); no tenderness along bicipital groove; pain elicited with L shoulder internal/external rotation, flexion; mildly positive empty can; negative Hawkins and impingement tests PSYCH: alert and oriented; appropriate to questions    Assessment & Plan:

## 2012-06-25 NOTE — Patient Instructions (Addendum)
Try the muscle relaxant, 1 tablet up to every 8 hours as needed It may make you sleepy  Try meloxicam (anti inflammatory pain medicine)  Try warm compresses to help relax the muscle  Get xray of your neck  Follow-up with Dr. [name] in 2 weeks; you may cancel appointment if XR okay and your pain is getting better  Worrisome symptoms:  -if numbness of hands -worsening neck pain -worsening pain and fevers/chills/nausea

## 2012-06-28 ENCOUNTER — Telehealth: Payer: Self-pay | Admitting: Family Medicine

## 2012-06-28 DIAGNOSIS — M542 Cervicalgia: Secondary | ICD-10-CM | POA: Insufficient documentation

## 2012-06-28 NOTE — Telephone Encounter (Signed)
Patient is calling for the results of his xray.

## 2012-06-28 NOTE — Assessment & Plan Note (Signed)
For the past 2 months.  He does have some muscle spasm of trapezius. He does not have any neurological deficiencies on physical exam, however, he does report rare left arm numbness. It does not appear his rotator cuff is affected.  -Treat for muscle spasm with Flexeril, Mobic, warm compresses -Check cervical XR>>>Chronic disc and endplate degeneration at C5-C6.>>>May predispose him to muscle spasm, and he may have having exacerbation of cervical spondylotic myelopathy. Consider prednisone burst or referral to orthopedist based on severity of symptoms.  -Complications from HIV also possibility; last CD4 count recorded last June 210. He was advised to monitor closely for symptoms of infection.  -Follow-up in 2 weeks or sooner if worsening symptoms; see AVS.

## 2012-06-28 NOTE — Telephone Encounter (Signed)
Will fwd to MD for review.  Kemaya Dorner L, CMA  

## 2012-06-28 NOTE — Telephone Encounter (Signed)
I did not order the imaging study. Therefore, I will contact Dr. Madolyn Frieze about how she would prefer to convey the results to the patient. As the PCP, I am happy to help as needed.

## 2012-06-28 NOTE — Telephone Encounter (Signed)
LVM notifying C-spine results See encounter A/P

## 2012-07-17 ENCOUNTER — Other Ambulatory Visit: Payer: Self-pay | Admitting: Internal Medicine

## 2012-08-14 ENCOUNTER — Other Ambulatory Visit: Payer: Self-pay | Admitting: Internal Medicine

## 2012-08-20 ENCOUNTER — Other Ambulatory Visit: Payer: Self-pay | Admitting: Internal Medicine

## 2012-08-23 ENCOUNTER — Other Ambulatory Visit: Payer: Self-pay | Admitting: *Deleted

## 2012-08-23 DIAGNOSIS — B20 Human immunodeficiency virus [HIV] disease: Secondary | ICD-10-CM

## 2012-08-23 MED ORDER — ATOVAQUONE 750 MG/5ML PO SUSP: ORAL | Status: DC

## 2012-08-23 MED ORDER — RITONAVIR 100 MG PO TABS: ORAL_TABLET | ORAL | Status: DC

## 2012-08-23 MED ORDER — DARUNAVIR ETHANOLATE 800 MG PO TABS: ORAL_TABLET | ORAL | Status: DC

## 2012-08-23 MED ORDER — ABACAVIR SULFATE-LAMIVUDINE 600-300 MG PO TABS: ORAL_TABLET | ORAL | Status: DC

## 2012-08-23 NOTE — Progress Notes (Signed)
Pt called to schedule appointment. Will give 1 month supply of medication. Pt must keep appointment for additional refills.

## 2012-08-26 ENCOUNTER — Other Ambulatory Visit (HOSPITAL_COMMUNITY)
Admission: RE | Admit: 2012-08-26 | Discharge: 2012-08-26 | Disposition: A | Discharge status: Home / Self Care | Payer: Medicaid Other | Source: Ambulatory Visit | Attending: Internal Medicine | Admitting: Internal Medicine

## 2012-08-26 ENCOUNTER — Other Ambulatory Visit: Payer: Medicaid Other

## 2012-08-26 ENCOUNTER — Other Ambulatory Visit: Payer: Self-pay | Admitting: Internal Medicine

## 2012-08-26 DIAGNOSIS — B20 Human immunodeficiency virus [HIV] disease: Secondary | ICD-10-CM

## 2012-08-26 DIAGNOSIS — Z113 Encounter for screening for infections with a predominantly sexual mode of transmission: Secondary | ICD-10-CM | POA: Insufficient documentation

## 2012-08-26 LAB — CBC WITH DIFFERENTIAL/PLATELET
Basophils Relative: 1 % (ref 0–1)
Eosinophils Absolute: 0.1 10*3/uL (ref 0.0–0.7)
Eosinophils Relative: 1 % (ref 0–5)
HCT: 37.6 % — ABNORMAL LOW (ref 39.0–52.0)
Hemoglobin: 13.4 g/dL (ref 13.0–17.0)
Lymphs Abs: 1.9 10*3/uL (ref 0.7–4.0)
MCH: 34.7 pg — ABNORMAL HIGH (ref 26.0–34.0)
MCHC: 35.6 g/dL (ref 30.0–36.0)
MCV: 97.4 fL (ref 78.0–100.0)
Monocytes Absolute: 0.4 10*3/uL (ref 0.1–1.0)
Monocytes Relative: 8 % (ref 3–12)
Neutrophils Relative %: 49 % (ref 43–77)
RBC: 3.86 MIL/uL — ABNORMAL LOW (ref 4.22–5.81)

## 2012-08-27 LAB — T-HELPER CELL (CD4) - (RCID CLINIC ONLY)
CD4 % Helper T Cell: 16 % — ABNORMAL LOW (ref 33–55)
CD4 T Cell Abs: 300 uL — ABNORMAL LOW (ref 400–2700)

## 2012-08-27 LAB — COMPREHENSIVE METABOLIC PANEL
Alkaline Phosphatase: 102 U/L (ref 39–117)
BUN: 18 mg/dL (ref 6–23)
CO2: 27 mEq/L (ref 19–32)
Glucose, Bld: 111 mg/dL — ABNORMAL HIGH (ref 70–99)
Sodium: 141 mEq/L (ref 135–145)
Total Bilirubin: 0.3 mg/dL (ref 0.3–1.2)
Total Protein: 7.6 g/dL (ref 6.0–8.3)

## 2012-08-27 LAB — T.PALLIDUM AB, TOTAL: T pallidum Antibodies (TP-PA): >8 S/CO — ABNORMAL HIGH (ref <0.90)

## 2012-08-27 LAB — RPR: RPR Ser Ql: REACTIVE — AB

## 2012-08-30 LAB — HIV-1 RNA QUANT-NO REFLEX-BLD: HIV 1 RNA Quant: 96 copies/mL — ABNORMAL HIGH (ref <20)

## 2012-09-27 ENCOUNTER — Other Ambulatory Visit: Payer: Self-pay | Admitting: Internal Medicine

## 2012-09-28 ENCOUNTER — Encounter: Payer: Self-pay | Admitting: Internal Medicine

## 2012-09-28 ENCOUNTER — Ambulatory Visit (INDEPENDENT_AMBULATORY_CARE_PROVIDER_SITE_OTHER): Payer: Medicaid Other | Admitting: Internal Medicine

## 2012-09-28 VITALS — BP 149/99 | HR 89 | Temp 98.5°F | Ht 72.0 in | Wt 133.0 lb

## 2012-09-28 DIAGNOSIS — N179 Acute kidney failure, unspecified: Secondary | ICD-10-CM

## 2012-09-28 DIAGNOSIS — Z23 Encounter for immunization: Secondary | ICD-10-CM

## 2012-09-28 DIAGNOSIS — B20 Human immunodeficiency virus [HIV] disease: Secondary | ICD-10-CM

## 2012-09-28 DIAGNOSIS — B1920 Unspecified viral hepatitis C without hepatic coma: Secondary | ICD-10-CM

## 2012-09-28 NOTE — Assessment & Plan Note (Signed)
He is doing well on his current regimen. Unfortunately, he does not have quite undetectable viral load though seems to have good response and therefore there is no indication for intensification. His CD4 count has increased some low I hoped it would be more in the normal range at this time. We will just follow it again in about 4 months.

## 2012-09-28 NOTE — Assessment & Plan Note (Signed)
He has no active virus

## 2012-09-28 NOTE — Assessment & Plan Note (Signed)
He continues to do well with his new regimen and I suspect his renal failure may have been due to tenofovir. It is now normal without any Truvada.

## 2012-09-28 NOTE — Progress Notes (Signed)
  Subjective:    Patient ID: Daniel House, male    DOB: 1975/04/04, 37 y.o.   MRN: [id number]  HPI He comes in for routine followup. He has not been seen in one year though continues on his regimen of Prezista, Norvir and Epzicom.  He denies any missed doses over the last year at all. He feels well his labs show a CD4 count which is up to 300 and a viral load that's under 100. He has had no weight loss, no diarrhea or hospitalizations. He continues to see his primary doctor as well.   Review of Systems  Constitutional: Negative for fever, fatigue and unexpected weight change.  HENT: Negative for sore throat and trouble swallowing.   Eyes: Negative for visual disturbance.  Respiratory: Negative for cough and shortness of breath.   Cardiovascular: Negative for chest pain and leg swelling.  Gastrointestinal: Negative for nausea, abdominal pain and diarrhea.  Musculoskeletal: Negative for myalgias and arthralgias.  Skin: Negative for rash.  Neurological: Negative for dizziness, light-headedness and headaches.  Hematological: Negative for adenopathy.  Psychiatric/Behavioral: Negative for dysphoric mood.       Objective:   Physical Exam  Constitutional: He is oriented to person, place, and time. He appears well-developed and well-nourished. No distress.  He is thin but tells me this is his typical weight  HENT:  Mouth/Throat: No oropharyngeal exudate.  Eyes: No scleral icterus.  Cardiovascular: Normal rate, regular rhythm and normal heart sounds.   No murmur heard. Pulmonary/Chest: Effort normal and breath sounds normal. No respiratory distress.  Lymphadenopathy:    He has no cervical adenopathy.  Neurological: He is alert and oriented to person, place, and time.  Skin: No rash noted.          Assessment & Plan:

## 2012-10-26 ENCOUNTER — Other Ambulatory Visit: Payer: Self-pay | Admitting: Internal Medicine

## 2012-10-26 DIAGNOSIS — B20 Human immunodeficiency virus [HIV] disease: Secondary | ICD-10-CM

## 2013-01-18 ENCOUNTER — Other Ambulatory Visit: Payer: Medicaid Other

## 2013-01-18 DIAGNOSIS — B20 Human immunodeficiency virus [HIV] disease: Secondary | ICD-10-CM

## 2013-01-19 LAB — T-HELPER CELL (CD4) - (RCID CLINIC ONLY): CD4 % Helper T Cell: 15 % — ABNORMAL LOW (ref 33–55)

## 2013-01-19 LAB — HIV-1 RNA QUANT-NO REFLEX-BLD: HIV 1 RNA Quant: 49 copies/mL — ABNORMAL HIGH (ref <20)

## 2013-02-01 ENCOUNTER — Ambulatory Visit (INDEPENDENT_AMBULATORY_CARE_PROVIDER_SITE_OTHER): Payer: Medicaid Other | Admitting: Internal Medicine

## 2013-02-01 ENCOUNTER — Encounter: Payer: Self-pay | Admitting: Internal Medicine

## 2013-02-01 VITALS — BP 137/93 | HR 80 | Temp 98.2°F | Ht 72.0 in | Wt 136.0 lb

## 2013-02-01 DIAGNOSIS — B20 Human immunodeficiency virus [HIV] disease: Secondary | ICD-10-CM

## 2013-02-01 DIAGNOSIS — G540 Brachial plexus disorders: Secondary | ICD-10-CM | POA: Insufficient documentation

## 2013-02-01 NOTE — Progress Notes (Signed)
  Subjective:    Patient ID: Daniel House, Daniel House    DOB: 06/07/1975, 38 y.o.   MRN: [id number]  HPI  He comes in for routine followup. He was last seen in September and continues on his regimen of Prezista, Norvir and Epzicom.  He denies any missed doses since his last visit at all. He feels well his labs show a CD4 count which remains at 300 and a viral load that's under 100. He has had no weight loss, no diarrhea or hospitalizations. He continues to see his primary doctor as well.  He does have a complaint of right arm pain and numbness that has occurred for three months.  He previously had some neck pain managed by his PCP with muscle relaxants that worked well but then a few months ago started noting nerve like pain that started on right shoulder and now is in arm with hand numbness.  No weakness at this time.     Review of Systems  Constitutional: Negative for fever, fatigue and unexpected weight change.  HENT: Negative for sore throat and trouble swallowing.   Eyes: Negative for visual disturbance.  Respiratory: Negative for cough and shortness of breath.   Cardiovascular: Negative for chest pain and leg swelling.  Gastrointestinal: Negative for nausea, abdominal pain and diarrhea.  Musculoskeletal: Negative for arthralgias and myalgias.  Skin: Negative for rash.  Neurological: Negative for dizziness, light-headedness and headaches.  Hematological: Negative for adenopathy.  Psychiatric/Behavioral: Negative for dysphoric mood.       Objective:   Physical Exam  Constitutional: He appears well-developed and well-nourished. No distress.  HENT:  Mouth/Throat: No oropharyngeal exudate.  Eyes: No scleral icterus.  Cardiovascular: Normal rate, regular rhythm and normal heart sounds.   No murmur heard. Pulmonary/Chest: Effort normal and breath sounds normal. No respiratory distress.  Lymphadenopathy:    He has no cervical adenopathy.  Neurological: He is alert.  No loss of sensation or  weakness  Skin: No rash noted.          Assessment & Plan:

## 2013-02-01 NOTE — Assessment & Plan Note (Signed)
It seems to be brachial related.  I will consider sending him to neurology for work up.  Not sure if MRI or nerve conduction studies appropriate or alternative diagnosis.

## 2013-02-01 NOTE — Assessment & Plan Note (Signed)
Doing well though still with minimally detectable virus.  RTC in 4 months.

## 2013-02-02 NOTE — Progress Notes (Signed)
I couldn't remember if I told you to refer him to neurology.  Thanks, Rob

## 2013-02-02 NOTE — Progress Notes (Signed)
(  also, I let the patient know this during his office visit.  I told him that he would have to make a visit with his PCP for the referral).

## 2013-02-02 NOTE — Progress Notes (Signed)
Y[place]if has [employer] - the referral has to come from his PCP.

## 2013-02-04 ENCOUNTER — Encounter: Payer: Self-pay | Admitting: *Deleted

## 2013-02-28 ENCOUNTER — Encounter: Payer: Self-pay | Admitting: Family Medicine

## 2013-02-28 ENCOUNTER — Ambulatory Visit (INDEPENDENT_AMBULATORY_CARE_PROVIDER_SITE_OTHER): Payer: Medicaid Other | Admitting: Family Medicine

## 2013-02-28 VITALS — BP 145/86 | HR 97 | Ht 72.0 in | Wt 142.0 lb

## 2013-02-28 DIAGNOSIS — M5412 Radiculopathy, cervical region: Secondary | ICD-10-CM

## 2013-02-28 DIAGNOSIS — M5010 Cervical disc disorder with radiculopathy, unspecified cervical region: Secondary | ICD-10-CM

## 2013-02-28 DIAGNOSIS — G540 Brachial plexus disorders: Secondary | ICD-10-CM

## 2013-02-28 MED ORDER — GABAPENTIN 300 MG PO CAPS: 300.0000 mg | ORAL_CAPSULE | Freq: Three times a day (TID) | ORAL | Status: DC

## 2013-02-28 MED ORDER — TRAMADOL HCL 50 MG PO TABS: 50.0000 mg | ORAL_TABLET | Freq: Three times a day (TID) | ORAL | Status: DC | PRN

## 2013-02-28 NOTE — Patient Instructions (Signed)
Mr. Daniel House,   Great to see you. I am concerned that you have a pinched nerve at the C6 level. I am very glad that you do not have any weakness of the arm. There is no indication for an MRI right now, but we will consider that in the future. For now, we will go up on the neurontin, and I will given you tramadol to take also as needed. Definitely take the tramadol each day to help it build up in your system.   Come back in 4 weeks.   Sincerely,   Dr. Clinton SawyerWilliamson

## 2013-02-28 NOTE — Progress Notes (Signed)
   Subjective:    Patient ID: Daniel House, male    DOB: [birth year], 38 y.o.   MRN: [id number]  HPI  38 year old M with HIV (last CD4 count 300 and RNA qant 49) and Hep C who presents with right arm pain.   Pain started as aching in posterior right shoulder. Spread down arm and pain stops distal to elbow. The index finger and thumb have numbness. Symptoms are constant. Denies weakness or problems with coordination of fingers. Started 4 months ago. He has been seen in the ED 1 month ago and told that he had a pinched nerve. [place] He was prescribed gabapentin 200 mg TID. He says that this did not help.   Cervical X-ray 06/25/12 reviewed - degenerative changes with anterior spurring of endplates and disc space loss at C4-C5 and C5-C6    Review of Systems No headaches, neck pain, recent trauma    Objective:   Physical Exam BP 145/86  Pulse 97  Ht 6' (1.829 m)  Wt 142 lb (64.411 kg)  BMI 19.25 kg/m2  Gen: thin AAM, well appearing Neck: normal ROM, + Spurlings sign on right RUE: normal appearing, no atrophy of muscles, normal ROM of right shoulder, right elbow and fingers Neuro: deceased sensation over volar thumb and index finger of right hand, 2+ biceps and brachioradialis; no weakness      Assessment & Plan:

## 2013-03-01 NOTE — Assessment & Plan Note (Signed)
A: appears that this may be in the distribution of C6 or possibly more distal in the brachial plexus, however no evidence of weakness or atrophy of muscles so no reason for further work up at this time, will favor conservative treatment at this time P: increase gabapentin to 300 mg TID and give tramadol 50 mg po PRN pain; f/u 1 month

## 2013-04-28 ENCOUNTER — Other Ambulatory Visit: Payer: Self-pay | Admitting: Internal Medicine

## 2013-06-10 ENCOUNTER — Other Ambulatory Visit: Payer: Self-pay | Admitting: Family Medicine

## 2013-06-10 ENCOUNTER — Ambulatory Visit (INDEPENDENT_AMBULATORY_CARE_PROVIDER_SITE_OTHER): Payer: Medicaid Other | Admitting: Family Medicine

## 2013-06-10 VITALS — BP 127/87 | HR 74 | Temp 98.6°F

## 2013-06-10 DIAGNOSIS — G540 Brachial plexus disorders: Secondary | ICD-10-CM

## 2013-06-10 DIAGNOSIS — M792 Neuralgia and neuritis, unspecified: Secondary | ICD-10-CM

## 2013-06-10 DIAGNOSIS — G5690 Unspecified mononeuropathy of unspecified upper limb: Secondary | ICD-10-CM

## 2013-06-10 MED ORDER — HYDROCODONE-ACETAMINOPHEN 10-325 MG PO TABS: 1.0000 | ORAL_TABLET | Freq: Three times a day (TID) | ORAL | Status: DC | PRN

## 2013-06-10 MED ORDER — PREGABALIN 75 MG PO CAPS: 75.0000 mg | ORAL_CAPSULE | Freq: Two times a day (BID) | ORAL | Status: DC

## 2013-06-10 NOTE — Patient Instructions (Signed)
Dear Daniel House,   Thank you for coming to clinic today. Please read below regarding the issues that we discussed.   1. Right shoulder pain - I want to start you on Lyrica, which can be used for nerve pain. Take one pill daily for a few days and move up to two pills per day. We will do this for one month to see how it goes. In the meantime, we can use hydrocodone as needed for severe pain. This is not a long term option, so we will try to find good dose of the Lyrica.   2. Neurology referral - I will make the referral today. If you have not heard anything in 2 weeks, then please call back.   Please follow up in clinic in 1 month. Please call earlier if you have any questions or concerns.   Sincerely,   Dr. [name]

## 2013-06-10 NOTE — Assessment & Plan Note (Signed)
A: persistent pain that is worsening, no weakness, but there is still decreased sensation in the thumb and index finger  P:  - stop gabapentin and tramadol - start lyrica at 75 mg BID, with option to titrate up - given hydrocodone-acetaminophen 10-325 mg q 8 hrs PRN, # 30; pt instructed this is not a long term option - referral to neurology at [facility]

## 2013-06-10 NOTE — Progress Notes (Signed)
   Subjective:    Patient ID: Daniel House, male    DOB: 18-Dec-1975, 38 y.o.   MRN: [id number]  HPI  38 year old M with HIV and hx of syphilis who presents for evaluation of arm pain.   Posterior right shoulder pain - Several months duration, evaluated for this in February 2015 by me and thought to be neuropathic pain related to HIV b/c no clearly in a dermatomal distribution and no evidence of MSK origin on exam; at the time the patient was prescribed Gabapentin 300 mg TID and tramadol. These are not effective, and the patient notes that his pain is worsening. He went to the ED at [facility] for this issue on 06/05/13. He states that the pain was so severe that he was crying, so he was given morphine which helped. He was also given a prescription for diazepam and tramdol, which were not effective. He denies any recent trauma or falls. He is still taking the same HIV medicines. He has no symptoms on the left side of his body or lower extremities.   Review of Systems Negative for fever, chills, weight loss    Objective:   Physical Exam BP 127/87  Pulse 74  Temp(Src) 98.6 F (37 C) (Oral)  SpO2 96%  Gen: thin AAM, well appearing  Neck: normal ROM, + Spurlings sign on right  RUE: normal appearing, no atrophy of muscles, normal ROM of right shoulder, right elbow and fingers  Neuro: deceased sensation over volar thumb and index finger of right hand, 2+ biceps and brachioradialis; no weakness       Assessment & Plan:

## 2013-08-08 ENCOUNTER — Other Ambulatory Visit: Payer: Medicaid Other

## 2013-08-08 ENCOUNTER — Other Ambulatory Visit (HOSPITAL_COMMUNITY)
Admission: RE | Admit: 2013-08-08 | Discharge: 2013-08-08 | Disposition: A | Discharge status: Home / Self Care | Payer: Medicaid Other | Source: Ambulatory Visit | Attending: Internal Medicine | Admitting: Internal Medicine

## 2013-08-08 DIAGNOSIS — B20 Human immunodeficiency virus [HIV] disease: Secondary | ICD-10-CM

## 2013-08-08 DIAGNOSIS — Z113 Encounter for screening for infections with a predominantly sexual mode of transmission: Secondary | ICD-10-CM | POA: Diagnosis not present

## 2013-08-09 LAB — T-HELPER CELL (CD4) - (RCID CLINIC ONLY)
CD4 % Helper T Cell: 21 % — ABNORMAL LOW (ref 33–55)
CD4 T Cell Abs: 390 /uL — ABNORMAL LOW (ref 400–2700)

## 2013-08-13 LAB — HIV-1 RNA QUANT-NO REFLEX-BLD
HIV 1 RNA Quant: <20 copies/mL (ref <20)
HIV-1 RNA Quant, Log: <1.3 {Log} (ref <1.30)

## 2013-08-22 ENCOUNTER — Ambulatory Visit: Payer: Medicaid Other | Admitting: Internal Medicine

## 2013-09-22 ENCOUNTER — Ambulatory Visit (INDEPENDENT_AMBULATORY_CARE_PROVIDER_SITE_OTHER): Payer: Medicaid Other | Admitting: Internal Medicine

## 2013-09-22 ENCOUNTER — Encounter: Payer: Self-pay | Admitting: Internal Medicine

## 2013-09-22 VITALS — BP 136/94 | HR 90 | Temp 98.5°F | Ht 72.0 in | Wt 131.0 lb

## 2013-09-22 DIAGNOSIS — Z23 Encounter for immunization: Secondary | ICD-10-CM

## 2013-09-22 DIAGNOSIS — B182 Chronic viral hepatitis C: Secondary | ICD-10-CM

## 2013-09-22 DIAGNOSIS — Z79899 Other long term (current) drug therapy: Secondary | ICD-10-CM | POA: Insufficient documentation

## 2013-09-22 DIAGNOSIS — R64 Cachexia: Secondary | ICD-10-CM

## 2013-09-22 DIAGNOSIS — B20 Human immunodeficiency virus [HIV] disease: Secondary | ICD-10-CM

## 2013-09-22 DIAGNOSIS — Z113 Encounter for screening for infections with a predominantly sexual mode of transmission: Secondary | ICD-10-CM | POA: Insufficient documentation

## 2013-09-22 NOTE — Progress Notes (Signed)
  Subjective:    Patient ID: Daniel House, male    DOB: 03/11/1975, 38 y.o.   MRN: [id number]  HPI He comes in for routine followup. He continues on his regimen of Prezista, Norvir and Epzicom.  He denies any missed doses since his last visit at all. He feels well his labs show a CD4 count now 390 and finally undetectable vl. He has had no weight loss, no diarrhea or hospitalizations. He continues to see his primary doctor as well.  right arm/shoulder pain resolved.     Review of Systems  Constitutional: Negative for fever, fatigue and unexpected weight change.  HENT: Negative for sore throat and trouble swallowing.   Eyes: Negative for visual disturbance.  Respiratory: Negative for cough and shortness of breath.   Cardiovascular: Negative for chest pain and leg swelling.  Gastrointestinal: Negative for nausea, abdominal pain and diarrhea.  Musculoskeletal: Negative for arthralgias and myalgias.  Skin: Negative for rash.  Neurological: Negative for dizziness, light-headedness and headaches.  Hematological: Negative for adenopathy.  Psychiatric/Behavioral: Negative for dysphoric mood.       Objective:   Physical Exam  Constitutional: He appears well-developed and well-nourished. No distress.  HENT:  Mouth/Throat: No oropharyngeal exudate.  Eyes: No scleral icterus.  Cardiovascular: Normal rate, regular rhythm and normal heart sounds.   No murmur heard. Pulmonary/Chest: Effort normal and breath sounds normal. No respiratory distress.  Lymphadenopathy:    He has no cervical adenopathy.  Skin: No rash noted.          Assessment & Plan:

## 2013-09-22 NOTE — Assessment & Plan Note (Signed)
Doing great and now undetectable.  Good tolerance and feels well. RTC 4 months

## 2013-09-22 NOTE — Assessment & Plan Note (Signed)
wieght much better.

## 2013-09-29 ENCOUNTER — Other Ambulatory Visit: Payer: Self-pay | Admitting: Internal Medicine

## 2013-09-29 DIAGNOSIS — B20 Human immunodeficiency virus [HIV] disease: Secondary | ICD-10-CM

## 2013-10-06 IMAGING — CR DG CHEST 2V
2 series · 2 of 2 positions shown · non-contrast
Comparison: 02/24/2011

CLINICAL DATA: Cough and shortness of breath.  Smoker.

CHEST - 2 VIEW

[w chest pa]
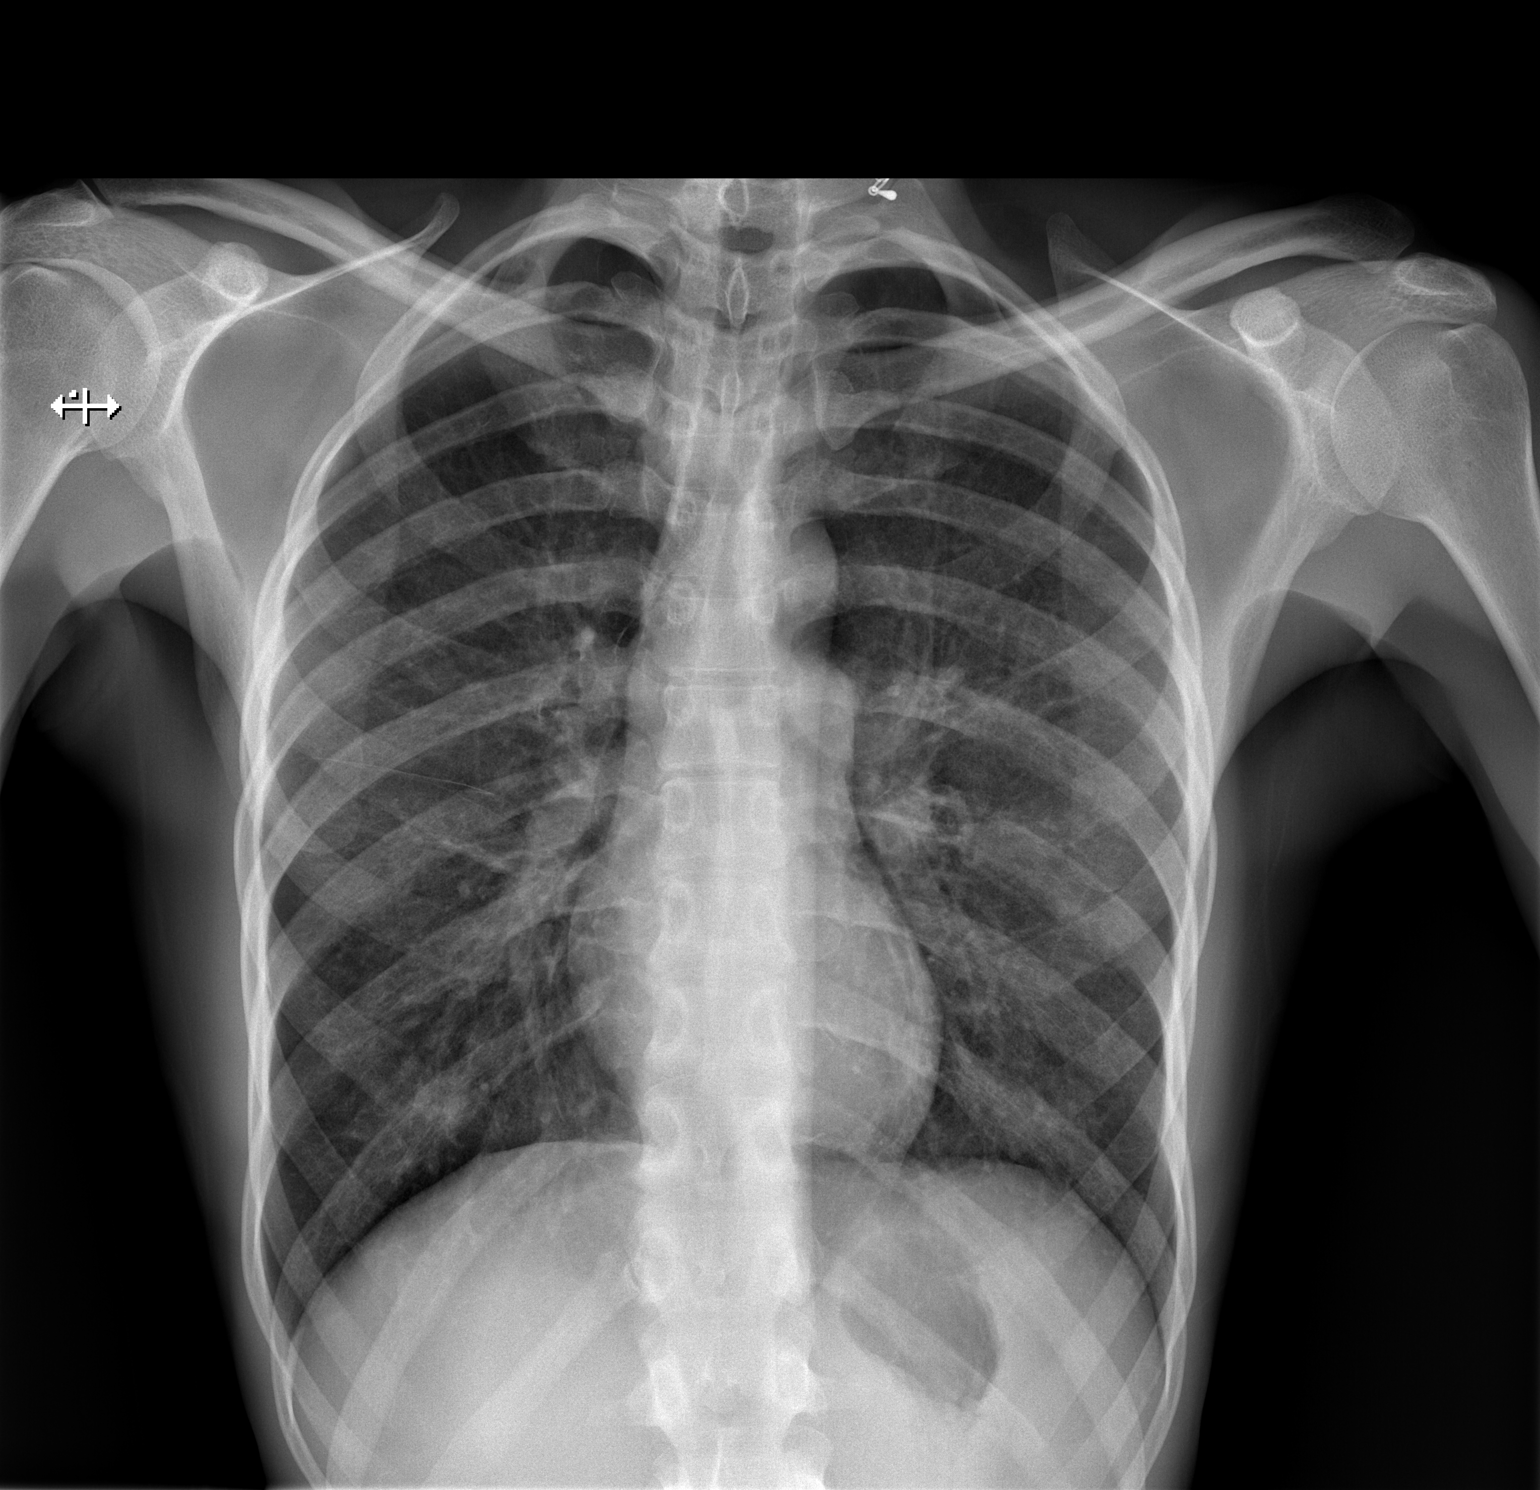

[w chest lat]
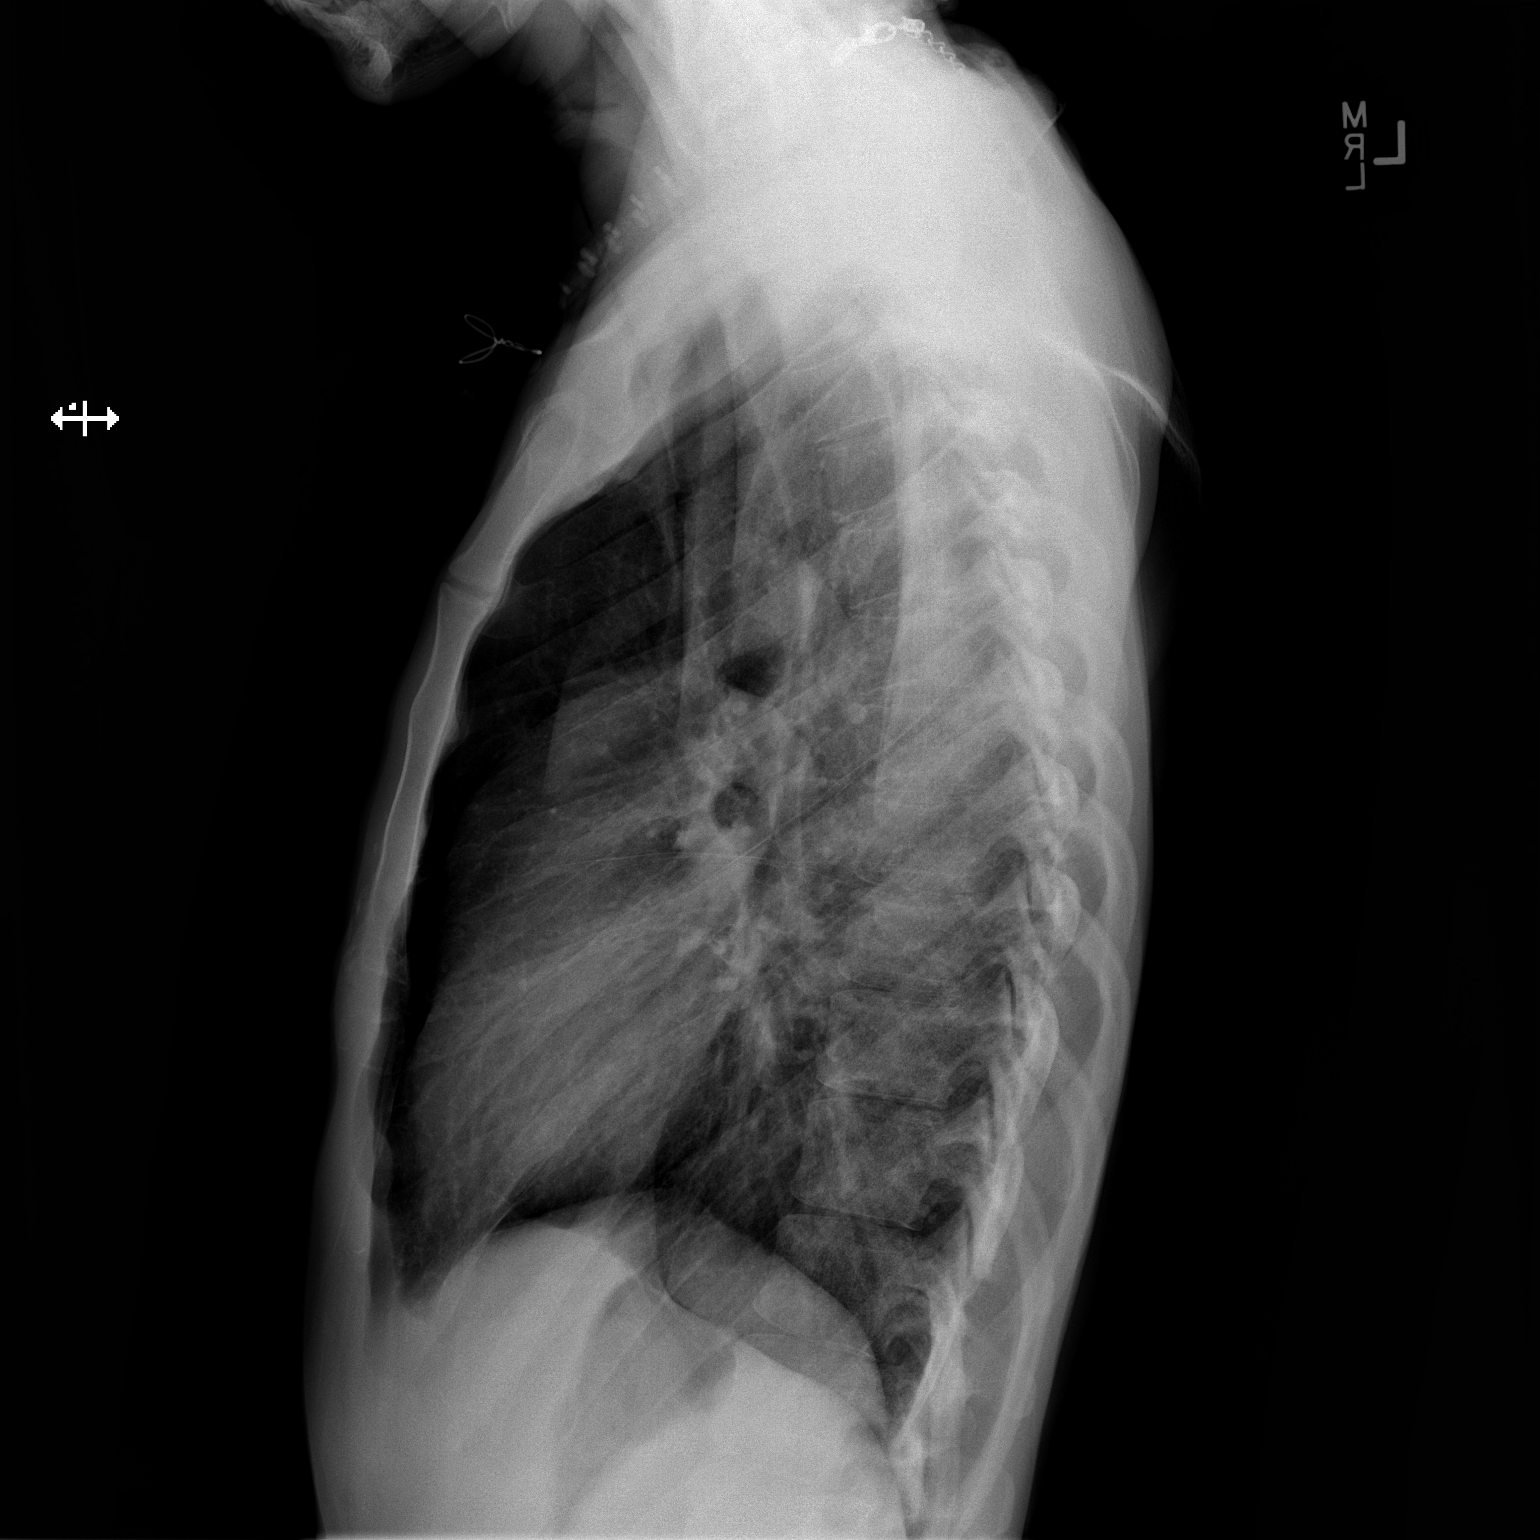

[2 of 2 positions shown; findings below may reference images not displayed]

FINDINGS: Mild hyperinflation.  Normal heart size and pulmonary
vascularity.  Central peribronchial thickening suggesting
bronchitis.  No focal airspace consolidation.  No blunting of
costophrenic angles.  No pneumothorax.
IMPRESSION: Mild hyperinflation and chronic bronchitic changes.  No evidence of
active consolidation.

## 2013-10-16 IMAGING — CR DG CHEST 1V PORT
1 series · 1 of 1 positions shown · non-contrast
Comparison: 04/30/2011 and earlier.

CLINICAL DATA: 36-year-old male with cough, pneumonia, chest
tightness.  HIV positive.

PORTABLE CHEST - 1 VIEW

[AP]
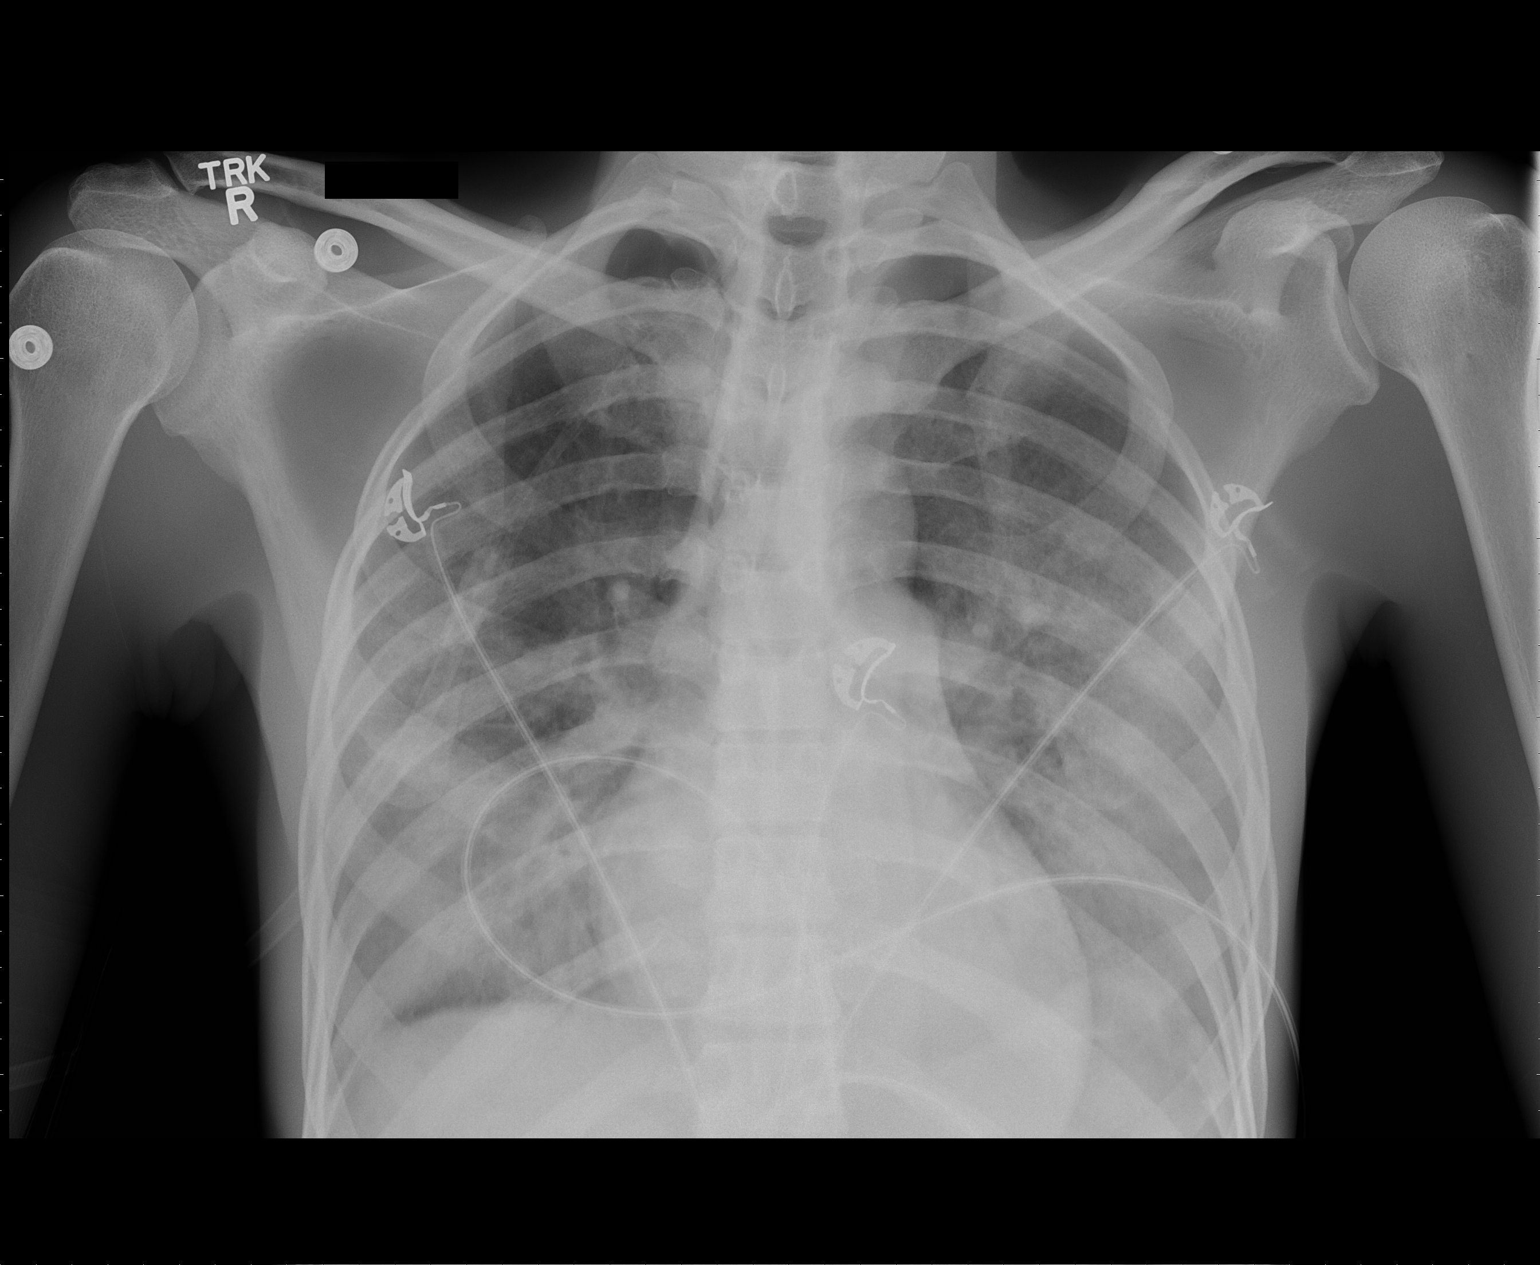

[1 of 1 positions shown; findings below may reference images not displayed]

FINDINGS: Semi upright AP portable view 0890 hours.  Slightly lower
lung volumes.  Cardiac size and mediastinal contours are within
normal limits.  Visualized tracheal air column is within normal
limits.  Basilar predominant pulmonary opacity persists and is
slightly more confluent.  Upper lobe ventilation is stable.
IMPRESSION: Lower lung volumes and slightly more confluent basilar predominant
bilateral airspace disease.

## 2013-10-26 IMAGING — CR DG CHEST 2V
2 series · 2 of 2 positions shown · non-contrast
Comparison: 05/02/2011

CLINICAL DATA: Follow up pneumonia and shortness of breath

CHEST - 2 VIEW

[w chest pa]
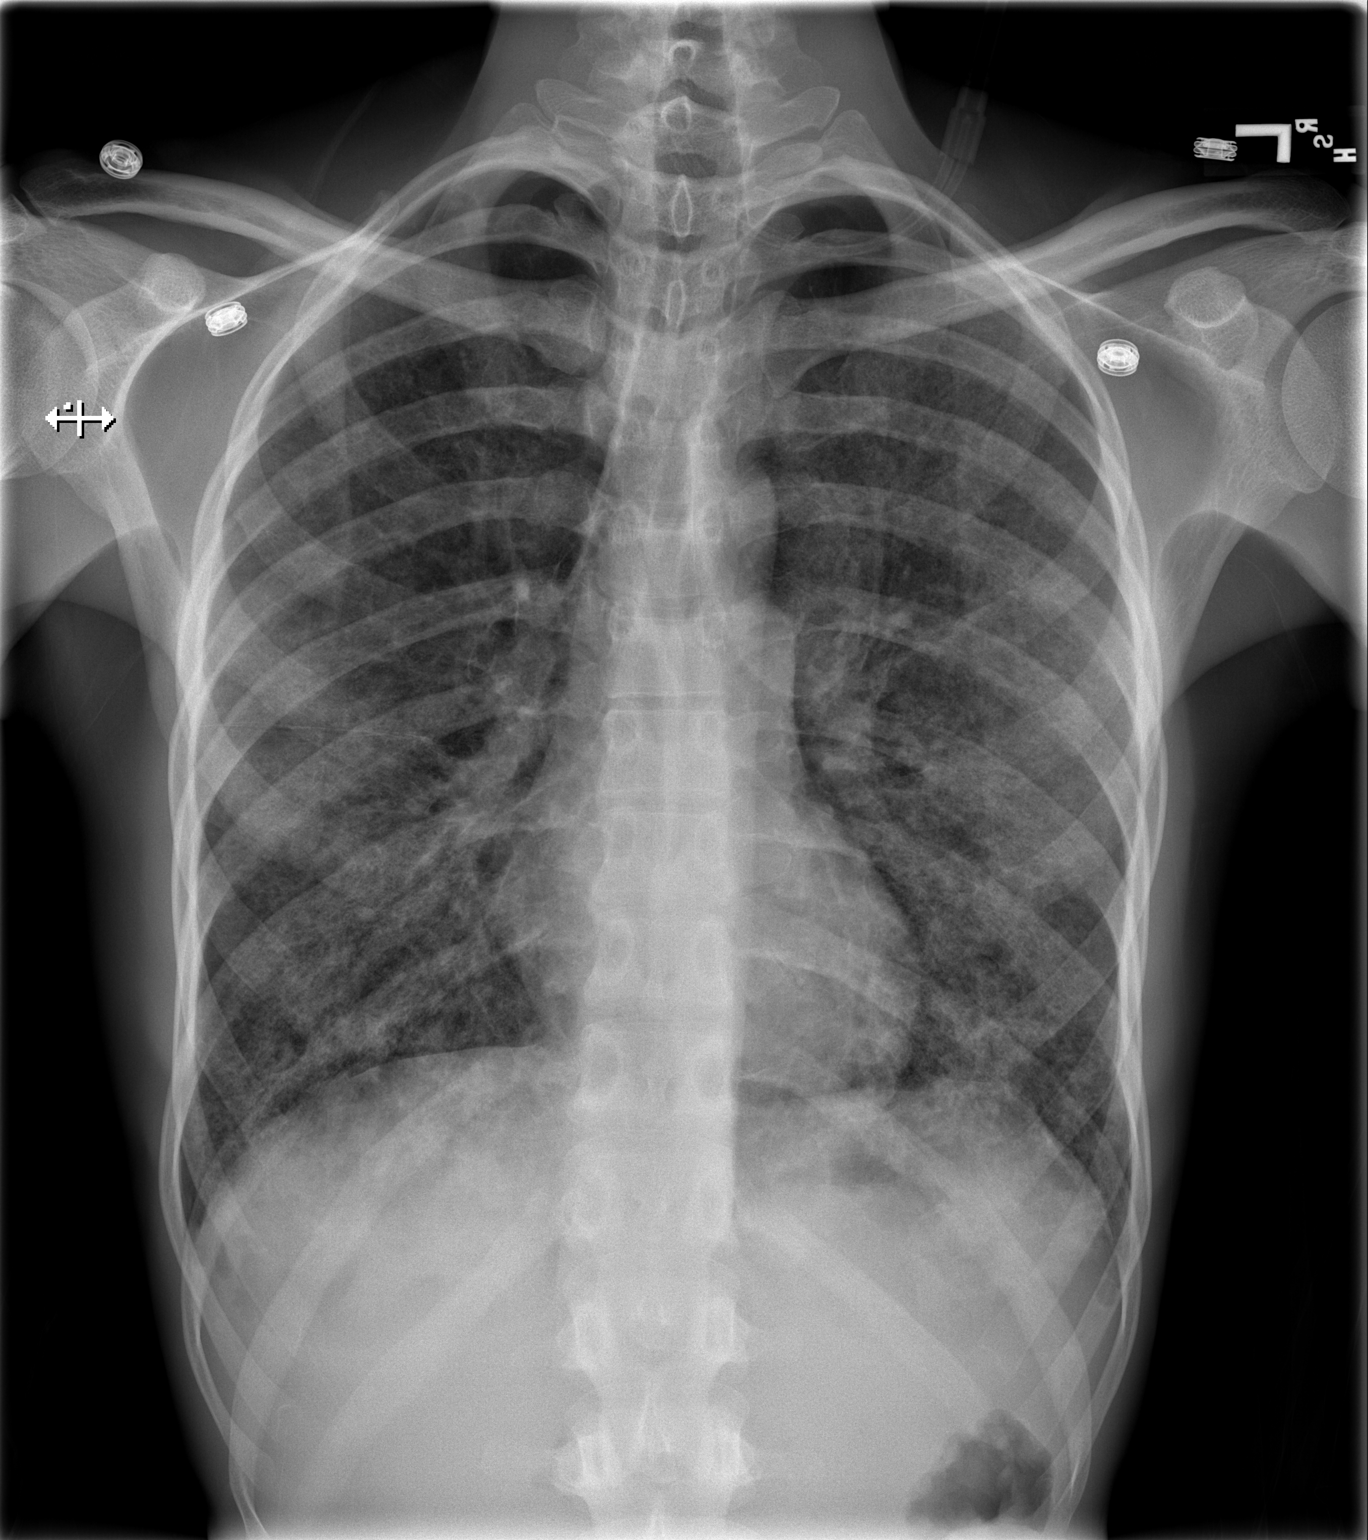

[w chest lat]
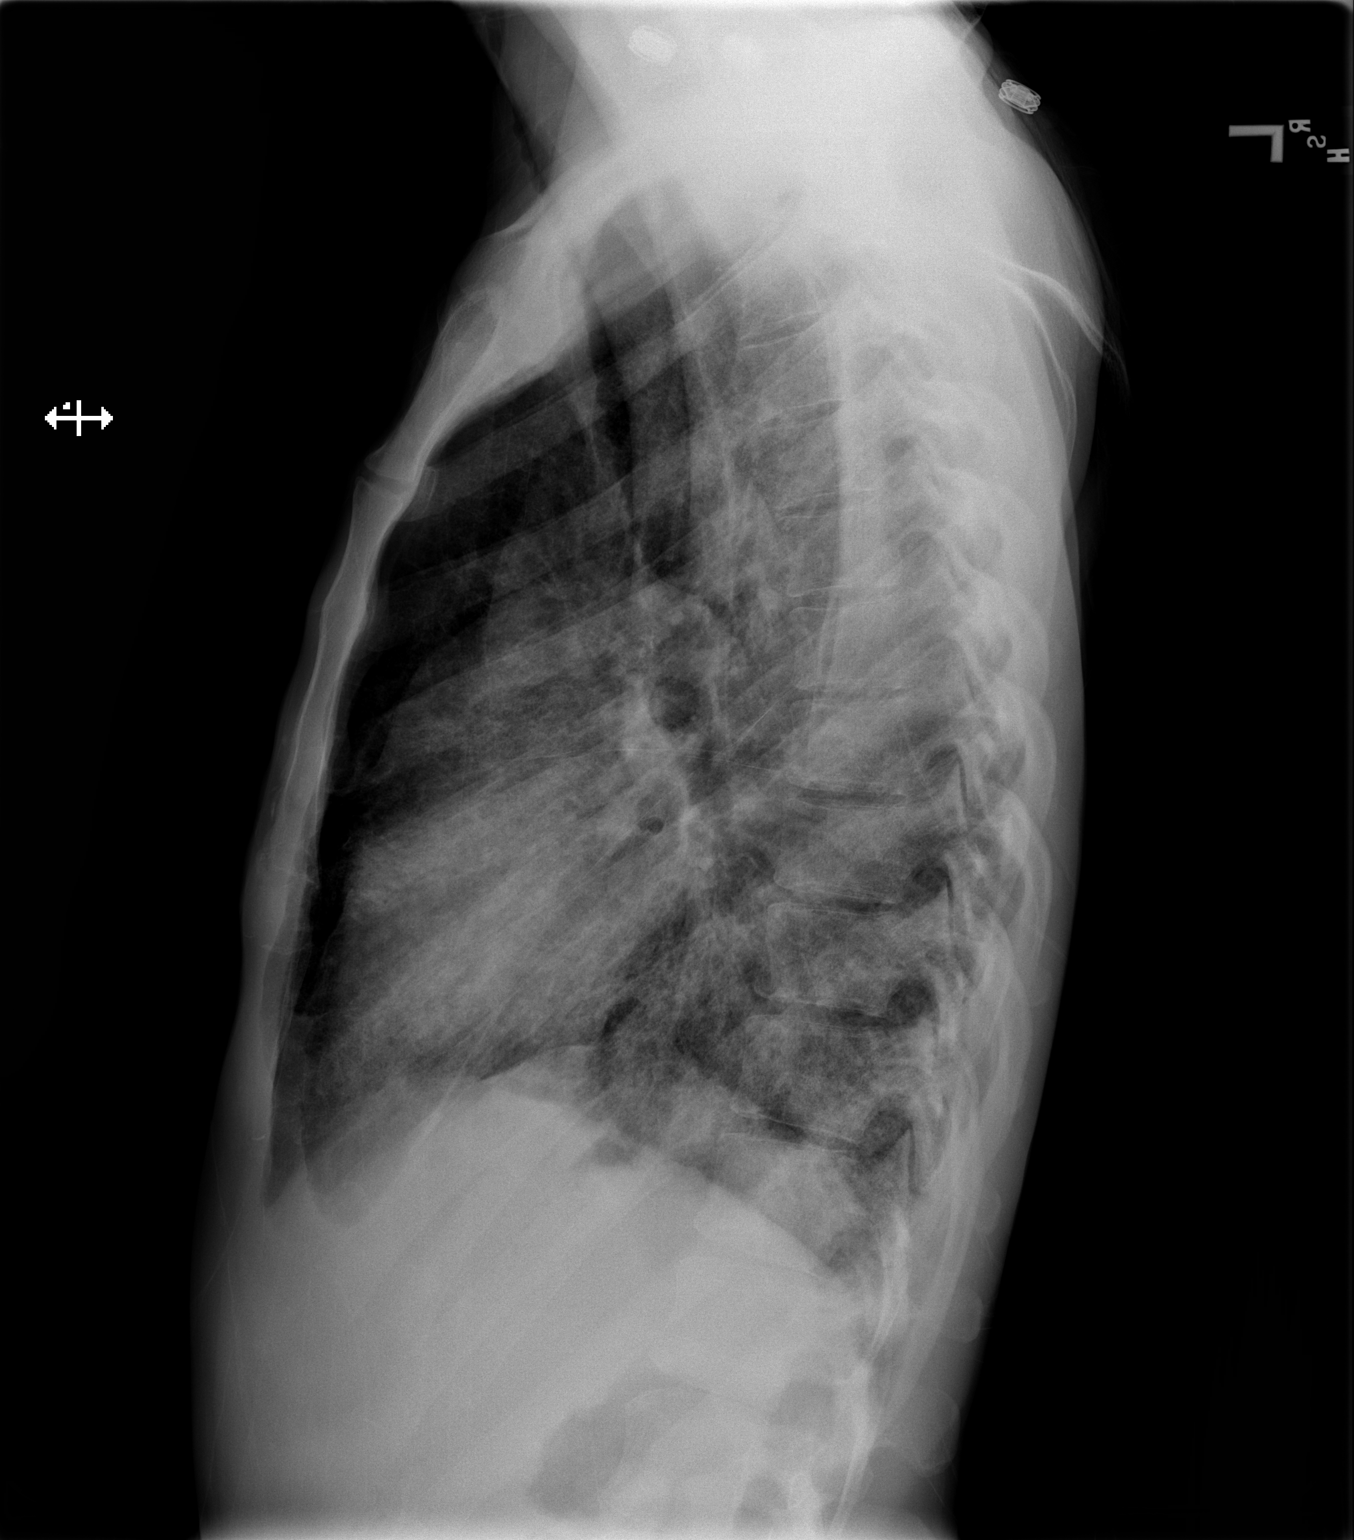

[2 of 2 positions shown; findings below may reference images not displayed]

FINDINGS: Heart size appears normal.

No pleural effusion identified.

The lungs appear hyperinflated.

Bilateral hazy lung opacities are again noted.  When compared with
the previous exam there is improved aeration of both lower lobes.

No new findings identified.
IMPRESSION: 1.  Persistent but improved bilateral lung opacities.

## 2014-01-09 ENCOUNTER — Other Ambulatory Visit (INDEPENDENT_AMBULATORY_CARE_PROVIDER_SITE_OTHER): Payer: Medicare Other

## 2014-01-09 DIAGNOSIS — Z79899 Other long term (current) drug therapy: Secondary | ICD-10-CM | POA: Diagnosis not present

## 2014-01-09 DIAGNOSIS — B20 Human immunodeficiency virus [HIV] disease: Secondary | ICD-10-CM

## 2014-01-09 DIAGNOSIS — Z113 Encounter for screening for infections with a predominantly sexual mode of transmission: Secondary | ICD-10-CM

## 2014-01-09 LAB — LIPID PANEL
Cholesterol: 147 mg/dL (ref 0–200)
HDL: 40 mg/dL (ref >39)
LDL Cholesterol: 75 mg/dL (ref 0–99)
Total CHOL/HDL Ratio: 3.7 Ratio
Triglycerides: 162 mg/dL — ABNORMAL HIGH (ref <150)
VLDL: 32 mg/dL (ref 0–40)

## 2014-01-09 LAB — CBC WITH DIFFERENTIAL/PLATELET
Basophils Absolute: 0.1 10*3/uL (ref 0.0–0.1)
Basophils Relative: 1 % (ref 0–1)
Eosinophils Absolute: 0.1 10*3/uL (ref 0.0–0.7)
Eosinophils Relative: 2 % (ref 0–5)
HEMATOCRIT: 39.8 % (ref 39.0–52.0)
HEMOGLOBIN: 13.7 g/dL (ref 13.0–17.0)
LYMPHS PCT: 40 % (ref 12–46)
Lymphs Abs: 2.5 10*3/uL (ref 0.7–4.0)
MCH: 34.3 pg — ABNORMAL HIGH (ref 26.0–34.0)
MCHC: 34.4 g/dL (ref 30.0–36.0)
MCV: 99.7 fL (ref 78.0–100.0)
MONOS PCT: 7 % (ref 3–12)
MPV: 11 fL (ref 9.4–12.4)
Monocytes Absolute: 0.4 10*3/uL (ref 0.1–1.0)
NEUTROS ABS: 3.2 10*3/uL (ref 1.7–7.7)
NEUTROS PCT: 50 % (ref 43–77)
Platelets: 207 10*3/uL (ref 150–400)
RBC: 3.99 MIL/uL — ABNORMAL LOW (ref 4.22–5.81)
RDW: 13.8 % (ref 11.5–15.5)
WBC: 6.3 10*3/uL (ref 4.0–10.5)

## 2014-01-09 LAB — COMPLETE METABOLIC PANEL WITH GFR
ALK PHOS: 93 U/L (ref 39–117)
ALT: 10 U/L (ref 0–53)
AST: 18 U/L (ref 0–37)
Albumin: 4.7 g/dL (ref 3.5–5.2)
BUN: 14 mg/dL (ref 6–23)
CO2: 25 mEq/L (ref 19–32)
Calcium: 9.3 mg/dL (ref 8.4–10.5)
Chloride: 106 mEq/L (ref 96–112)
Creat: 1.22 mg/dL (ref 0.50–1.35)
GFR, EST NON AFRICAN AMERICAN: 75 mL/min
GFR, Est African American: 86 mL/min
GLUCOSE: 91 mg/dL (ref 70–99)
Potassium: 4.5 mEq/L (ref 3.5–5.3)
SODIUM: 140 meq/L (ref 135–145)
Total Bilirubin: 0.3 mg/dL (ref 0.2–1.2)
Total Protein: 7.2 g/dL (ref 6.0–8.3)

## 2014-01-09 LAB — RPR: RPR: REACTIVE — AB

## 2014-01-09 LAB — RPR TITER

## 2014-01-10 LAB — HIV-1 RNA QUANT-NO REFLEX-BLD: HIV 1 RNA Quant: <20 copies/mL (ref <20)

## 2014-01-10 LAB — T-HELPER CELL (CD4) - (RCID CLINIC ONLY)
CD4 % Helper T Cell: 18 % — ABNORMAL LOW (ref 33–55)
CD4 T Cell Abs: 450 /uL (ref 400–2700)

## 2014-01-11 LAB — FLUORESCENT TREPONEMAL AB(FTA)-IGG-BLD: FLUORESCENT TREPONEMAL ABS: REACTIVE — AB

## 2014-01-24 ENCOUNTER — Encounter: Payer: Self-pay | Admitting: Internal Medicine

## 2014-01-24 ENCOUNTER — Ambulatory Visit (INDEPENDENT_AMBULATORY_CARE_PROVIDER_SITE_OTHER): Payer: Medicare Other | Admitting: Internal Medicine

## 2014-01-24 VITALS — BP 144/95 | HR 66 | Temp 98.2°F | Ht 72.0 in | Wt 135.0 lb

## 2014-01-24 DIAGNOSIS — B20 Human immunodeficiency virus [HIV] disease: Secondary | ICD-10-CM

## 2014-01-24 MED ORDER — ABACAVIR-DOLUTEGRAVIR-LAMIVUD 600-50-300 MG PO TABS: 1.0000 | ORAL_TABLET | Freq: Every day | ORAL | Status: DC

## 2014-01-24 NOTE — Progress Notes (Signed)
  Subjective:    Patient ID: Daniel House, male    DOB: [birth year], 39 y.o.   MRN: [id number]  HPI He comes in for routine followup. He continues on his regimen of Prezista, Norvir and Epzicom.  He denies any missed doses since his last visit at all. He feels well his labs show a CD4 count now 450 and continued undetectable vl. He has had no weight loss, no diarrhea or hospitalizations. He continues to see his primary doctor as well.   Review of Systems  Constitutional: Negative for fever, fatigue and unexpected weight change.  HENT: Negative for sore throat and trouble swallowing.   Eyes: Negative for visual disturbance.  Respiratory: Negative for cough and shortness of breath.   Cardiovascular: Negative for chest pain and leg swelling.  Gastrointestinal: Negative for nausea, abdominal pain and diarrhea.  Musculoskeletal: Negative for myalgias and arthralgias.  Skin: Negative for rash.  Neurological: Negative for dizziness, light-headedness and headaches.  Hematological: Negative for adenopathy.  Psychiatric/Behavioral: Negative for dysphoric mood.       Objective:   Physical Exam  Constitutional: He appears well-developed and well-nourished. No distress.  HENT:  Mouth/Throat: No oropharyngeal exudate.  Eyes: No scleral icterus.  Cardiovascular: Normal rate, regular rhythm and normal heart sounds.   No murmur heard. Pulmonary/Chest: Effort normal and breath sounds normal. No respiratory distress.  Lymphadenopathy:    He has no cervical adenopathy.  Skin: No rash noted.          Assessment & Plan:

## 2014-01-24 NOTE — Assessment & Plan Note (Signed)
He is doing well. I will change him to Triumeq with his next refill. He will return in 4 months.

## 2014-04-15 DIAGNOSIS — K5900 Constipation, unspecified: Secondary | ICD-10-CM | POA: Diagnosis not present

## 2014-04-15 DIAGNOSIS — N410 Acute prostatitis: Secondary | ICD-10-CM | POA: Diagnosis not present

## 2014-04-15 DIAGNOSIS — Z8619 Personal history of other infectious and parasitic diseases: Secondary | ICD-10-CM | POA: Diagnosis not present

## 2014-04-15 DIAGNOSIS — Z21 Asymptomatic human immunodeficiency virus [HIV] infection status: Secondary | ICD-10-CM | POA: Diagnosis not present

## 2014-04-15 DIAGNOSIS — F17210 Nicotine dependence, cigarettes, uncomplicated: Secondary | ICD-10-CM | POA: Diagnosis not present

## 2014-04-15 DIAGNOSIS — K648 Other hemorrhoids: Secondary | ICD-10-CM | POA: Diagnosis not present

## 2014-04-15 DIAGNOSIS — R509 Fever, unspecified: Secondary | ICD-10-CM | POA: Diagnosis not present

## 2014-06-29 DIAGNOSIS — R569 Unspecified convulsions: Secondary | ICD-10-CM | POA: Diagnosis not present

## 2014-06-29 DIAGNOSIS — F17210 Nicotine dependence, cigarettes, uncomplicated: Secondary | ICD-10-CM | POA: Diagnosis not present

## 2014-06-29 DIAGNOSIS — Z21 Asymptomatic human immunodeficiency virus [HIV] infection status: Secondary | ICD-10-CM | POA: Diagnosis not present

## 2014-07-20 DIAGNOSIS — R569 Unspecified convulsions: Secondary | ICD-10-CM | POA: Diagnosis not present

## 2014-07-20 DIAGNOSIS — Z21 Asymptomatic human immunodeficiency virus [HIV] infection status: Secondary | ICD-10-CM | POA: Diagnosis not present

## 2014-07-22 ENCOUNTER — Other Ambulatory Visit: Payer: Self-pay | Admitting: Internal Medicine

## 2014-07-22 DIAGNOSIS — B20 Human immunodeficiency virus [HIV] disease: Secondary | ICD-10-CM

## 2014-07-25 MED ORDER — ABACAVIR-DOLUTEGRAVIR-LAMIVUD 600-50-300 MG PO TABS: 1.0000 | ORAL_TABLET | Freq: Every day | ORAL | Status: DC

## 2014-10-21 ENCOUNTER — Other Ambulatory Visit: Payer: Self-pay | Admitting: Internal Medicine

## 2014-11-20 ENCOUNTER — Other Ambulatory Visit: Payer: Self-pay | Admitting: Infectious Diseases

## 2014-11-20 ENCOUNTER — Telehealth: Payer: Self-pay | Admitting: *Deleted

## 2014-11-20 DIAGNOSIS — B20 Human immunodeficiency virus [HIV] disease: Secondary | ICD-10-CM

## 2014-11-20 NOTE — Telephone Encounter (Signed)
Patient requesting refill of triumeq. RN refilled 30 days R#1, noted he is overdue for an appointment.  RN left message asking patient to call and schedule ASAP. Andree CossHowell, Michelle M, RN

## 2014-12-23 ENCOUNTER — Emergency Department (HOSPITAL_COMMUNITY): Payer: Medicare Other

## 2014-12-23 ENCOUNTER — Emergency Department (HOSPITAL_COMMUNITY): Payer: Medicare Other | Admitting: Anesthesiology

## 2014-12-23 ENCOUNTER — Inpatient Hospital Stay: Admit: 2014-12-23 | Payer: Self-pay | Admitting: General Surgery

## 2014-12-23 ENCOUNTER — Encounter (HOSPITAL_COMMUNITY): Payer: Self-pay

## 2014-12-23 ENCOUNTER — Encounter (HOSPITAL_COMMUNITY)
Admission: EM | Disposition: A | Discharge status: Home / Self Care | Payer: Self-pay | Source: Home / Self Care | Attending: Emergency Medicine

## 2014-12-23 ENCOUNTER — Ambulatory Visit (HOSPITAL_COMMUNITY)
Admission: EM | Admit: 2014-12-23 | Discharge: 2014-12-23 | Disposition: A | Discharge status: Home / Self Care | Payer: Medicare Other | Attending: Physician Assistant | Admitting: Physician Assistant

## 2014-12-23 DIAGNOSIS — Z881 Allergy status to other antibiotic agents status: Secondary | ICD-10-CM | POA: Diagnosis not present

## 2014-12-23 DIAGNOSIS — S0181XA Laceration without foreign body of other part of head, initial encounter: Secondary | ICD-10-CM | POA: Diagnosis not present

## 2014-12-23 DIAGNOSIS — Z87891 Personal history of nicotine dependence: Secondary | ICD-10-CM | POA: Diagnosis not present

## 2014-12-23 DIAGNOSIS — R55 Syncope and collapse: Secondary | ICD-10-CM | POA: Diagnosis not present

## 2014-12-23 DIAGNOSIS — S52512A Displaced fracture of left radial styloid process, initial encounter for closed fracture: Secondary | ICD-10-CM | POA: Insufficient documentation

## 2014-12-23 DIAGNOSIS — S0083XA Contusion of other part of head, initial encounter: Secondary | ICD-10-CM | POA: Diagnosis not present

## 2014-12-23 DIAGNOSIS — N189 Chronic kidney disease, unspecified: Secondary | ICD-10-CM | POA: Diagnosis not present

## 2014-12-23 DIAGNOSIS — S199XXA Unspecified injury of neck, initial encounter: Secondary | ICD-10-CM | POA: Diagnosis not present

## 2014-12-23 DIAGNOSIS — F10921 Alcohol use, unspecified with intoxication delirium: Secondary | ICD-10-CM

## 2014-12-23 DIAGNOSIS — S52502A Unspecified fracture of the lower end of left radius, initial encounter for closed fracture: Secondary | ICD-10-CM

## 2014-12-23 DIAGNOSIS — G939 Disorder of brain, unspecified: Secondary | ICD-10-CM | POA: Diagnosis present

## 2014-12-23 DIAGNOSIS — J984 Other disorders of lung: Secondary | ICD-10-CM | POA: Diagnosis not present

## 2014-12-23 DIAGNOSIS — S52622A Torus fracture of lower end of left ulna, initial encounter for closed fracture: Secondary | ICD-10-CM | POA: Diagnosis not present

## 2014-12-23 DIAGNOSIS — S0292XA Unspecified fracture of facial bones, initial encounter for closed fracture: Secondary | ICD-10-CM

## 2014-12-23 DIAGNOSIS — R52 Pain, unspecified: Secondary | ICD-10-CM | POA: Diagnosis present

## 2014-12-23 DIAGNOSIS — B20 Human immunodeficiency virus [HIV] disease: Secondary | ICD-10-CM | POA: Diagnosis not present

## 2014-12-23 DIAGNOSIS — W109XXA Fall (on) (from) unspecified stairs and steps, initial encounter: Secondary | ICD-10-CM | POA: Insufficient documentation

## 2014-12-23 DIAGNOSIS — S62102A Fracture of unspecified carpal bone, left wrist, initial encounter for closed fracture: Secondary | ICD-10-CM | POA: Diagnosis not present

## 2014-12-23 DIAGNOSIS — T148 Other injury of unspecified body region: Secondary | ICD-10-CM | POA: Diagnosis not present

## 2014-12-23 DIAGNOSIS — Y92008 Other place in unspecified non-institutional (private) residence as the place of occurrence of the external cause: Secondary | ICD-10-CM | POA: Diagnosis not present

## 2014-12-23 DIAGNOSIS — Z888 Allergy status to other drugs, medicaments and biological substances status: Secondary | ICD-10-CM | POA: Insufficient documentation

## 2014-12-23 DIAGNOSIS — S52572A Other intraarticular fracture of lower end of left radius, initial encounter for closed fracture: Secondary | ICD-10-CM | POA: Diagnosis not present

## 2014-12-23 DIAGNOSIS — S3991XA Unspecified injury of abdomen, initial encounter: Secondary | ICD-10-CM | POA: Diagnosis not present

## 2014-12-23 DIAGNOSIS — B1920 Unspecified viral hepatitis C without hepatic coma: Secondary | ICD-10-CM | POA: Diagnosis not present

## 2014-12-23 DIAGNOSIS — S0282XA Fracture of other specified skull and facial bones, left side, initial encounter for closed fracture: Secondary | ICD-10-CM | POA: Diagnosis not present

## 2014-12-23 DIAGNOSIS — S299XXA Unspecified injury of thorax, initial encounter: Secondary | ICD-10-CM | POA: Diagnosis not present

## 2014-12-23 DIAGNOSIS — J439 Emphysema, unspecified: Secondary | ICD-10-CM | POA: Diagnosis not present

## 2014-12-23 DIAGNOSIS — J322 Chronic ethmoidal sinusitis: Secondary | ICD-10-CM | POA: Diagnosis not present

## 2014-12-23 HISTORY — PX: ORIF WRIST FRACTURE: SHX2133

## 2014-12-23 LAB — COMPREHENSIVE METABOLIC PANEL
ALT: 21 U/L (ref 17–63)
AST: 29 U/L (ref 15–41)
Albumin: 4.6 g/dL (ref 3.5–5.0)
Alkaline Phosphatase: 81 U/L (ref 38–126)
Anion gap: 10 (ref 5–15)
BUN: 16 mg/dL (ref 6–20)
CHLORIDE: 106 mmol/L (ref 101–111)
CO2: 26 mmol/L (ref 22–32)
Calcium: 9.3 mg/dL (ref 8.9–10.3)
Creatinine, Ser: 1.35 mg/dL — ABNORMAL HIGH (ref 0.61–1.24)
GFR calc non Af Amer: >60 mL/min (ref >60)
Glucose, Bld: 117 mg/dL — ABNORMAL HIGH (ref 65–99)
POTASSIUM: 3.7 mmol/L (ref 3.5–5.1)
SODIUM: 142 mmol/L (ref 135–145)
Total Bilirubin: 0.6 mg/dL (ref 0.3–1.2)
Total Protein: 8 g/dL (ref 6.5–8.1)

## 2014-12-23 LAB — CBC
HEMATOCRIT: 43.8 % (ref 39.0–52.0)
Hemoglobin: 14.9 g/dL (ref 13.0–17.0)
MCH: 35.3 pg — ABNORMAL HIGH (ref 26.0–34.0)
MCHC: 34 g/dL (ref 30.0–36.0)
MCV: 103.8 fL — AB (ref 78.0–100.0)
Platelets: 211 10*3/uL (ref 150–400)
RBC: 4.22 MIL/uL (ref 4.22–5.81)
RDW: 12.5 % (ref 11.5–15.5)
WBC: 9.7 10*3/uL (ref 4.0–10.5)

## 2014-12-23 LAB — RAPID URINE DRUG SCREEN, HOSP PERFORMED
Amphetamines: NOT DETECTED
Barbiturates: NOT DETECTED
Benzodiazepines: NOT DETECTED
COCAINE: NOT DETECTED
OPIATES: NOT DETECTED
Tetrahydrocannabinol: POSITIVE — AB

## 2014-12-23 LAB — ETHANOL
ALCOHOL ETHYL (B): 169 mg/dL — AB (ref <5)
Alcohol, Ethyl (B): 288 mg/dL — ABNORMAL HIGH (ref <5)

## 2014-12-23 LAB — PROTIME-INR
INR: 0.92 (ref 0.00–1.49)
Prothrombin Time: 12.6 seconds (ref 11.6–15.2)

## 2014-12-23 SURGERY — OPEN REDUCTION INTERNAL FIXATION (ORIF) WRIST FRACTURE
Anesthesia: General | Site: Wrist | Laterality: Left

## 2014-12-23 MED ORDER — BUPIVACAINE HCL (PF) 0.25 % IJ SOLN
INTRAMUSCULAR | Status: AC
  Filled 2014-12-23: qty 30

## 2014-12-23 MED ORDER — SUCCINYLCHOLINE CHLORIDE 20 MG/ML IJ SOLN
INTRAMUSCULAR | Status: DC | PRN
  Administered 2014-12-23: 120 mg via INTRAVENOUS

## 2014-12-23 MED ORDER — 0.9 % SODIUM CHLORIDE (POUR BTL) OPTIME
TOPICAL | Status: DC | PRN
  Administered 2014-12-23: 1000 mL

## 2014-12-23 MED ORDER — DEXAMETHASONE SODIUM PHOSPHATE 4 MG/ML IJ SOLN
INTRAMUSCULAR | Status: AC
  Filled 2014-12-23: qty 1

## 2014-12-23 MED ORDER — LORAZEPAM 2 MG/ML IJ SOLN
1.0000 mg | Freq: Once | INTRAMUSCULAR | Status: AC
  Administered 2014-12-23: 1 mg via INTRAVENOUS
  Filled 2014-12-23: qty 1

## 2014-12-23 MED ORDER — CEFAZOLIN SODIUM-DEXTROSE 2-3 GM-% IV SOLR
INTRAVENOUS | Status: DC | PRN
Start: 2014-12-23 — End: 2014-12-23
  Administered 2014-12-23: 2 g via INTRAVENOUS

## 2014-12-23 MED ORDER — DEXAMETHASONE SODIUM PHOSPHATE 4 MG/ML IJ SOLN
INTRAMUSCULAR | Status: DC | PRN
  Administered 2014-12-23: 4 mg via INTRAVENOUS

## 2014-12-23 MED ORDER — LIDOCAINE HCL (CARDIAC) 20 MG/ML IV SOLN
INTRAVENOUS | Status: DC | PRN
  Administered 2014-12-23: 100 mg via INTRAVENOUS

## 2014-12-23 MED ORDER — LACTATED RINGERS IV SOLN
INTRAVENOUS | Status: DC | PRN
  Administered 2014-12-23 (×2): via INTRAVENOUS

## 2014-12-23 MED ORDER — SUCCINYLCHOLINE CHLORIDE 20 MG/ML IJ SOLN
INTRAMUSCULAR | Status: AC
  Filled 2014-12-23: qty 1

## 2014-12-23 MED ORDER — FENTANYL CITRATE (PF) 250 MCG/5ML IJ SOLN
INTRAMUSCULAR | Status: AC
  Filled 2014-12-23: qty 5

## 2014-12-23 MED ORDER — FENTANYL CITRATE (PF) 250 MCG/5ML IJ SOLN
INTRAMUSCULAR | Status: DC | PRN
  Administered 2014-12-23 (×2): 50 ug via INTRAVENOUS

## 2014-12-23 MED ORDER — PROMETHAZINE HCL 25 MG/ML IJ SOLN: 6.2500 mg | INTRAMUSCULAR | Status: DC | PRN

## 2014-12-23 MED ORDER — HYDROMORPHONE HCL 1 MG/ML IJ SOLN
0.2500 mg | INTRAMUSCULAR | Status: DC | PRN
  Administered 2014-12-23 (×2): 0.5 mg via INTRAVENOUS

## 2014-12-23 MED ORDER — TETANUS-DIPHTH-ACELL PERTUSSIS 5-2.5-18.5 LF-MCG/0.5 IM SUSP
0.5000 mL | Freq: Once | INTRAMUSCULAR | Status: AC
Start: 2014-12-23 — End: 2014-12-23
  Administered 2014-12-23: 0.5 mL via INTRAMUSCULAR
  Filled 2014-12-23: qty 0.5

## 2014-12-23 MED ORDER — HYDROMORPHONE HCL 1 MG/ML IJ SOLN
INTRAMUSCULAR | Status: AC
  Administered 2014-12-23: 0.5 mg via INTRAVENOUS
  Filled 2014-12-23: qty 1

## 2014-12-23 MED ORDER — LIDOCAINE HCL (CARDIAC) 20 MG/ML IV SOLN
INTRAVENOUS | Status: AC
  Filled 2014-12-23: qty 5

## 2014-12-23 MED ORDER — IOHEXOL 300 MG/ML  SOLN
100.0000 mL | Freq: Once | INTRAMUSCULAR | Status: AC | PRN
  Administered 2014-12-23: 100 mL via INTRAVENOUS

## 2014-12-23 MED ORDER — PROPOFOL 10 MG/ML IV BOLUS
INTRAVENOUS | Status: DC | PRN
  Administered 2014-12-23: 130 mg via INTRAVENOUS

## 2014-12-23 MED ORDER — CEFAZOLIN SODIUM-DEXTROSE 2-3 GM-% IV SOLR
INTRAVENOUS | Status: AC
  Filled 2014-12-23: qty 50

## 2014-12-23 MED ORDER — PROPOFOL 10 MG/ML IV BOLUS
INTRAVENOUS | Status: AC
  Filled 2014-12-23: qty 20

## 2014-12-23 MED ORDER — BUPIVACAINE HCL 0.25 % IJ SOLN
INTRAMUSCULAR | Status: DC | PRN
  Administered 2014-12-23: 30 mL

## 2014-12-23 MED ORDER — MIDAZOLAM HCL 2 MG/2ML IJ SOLN
INTRAMUSCULAR | Status: AC
  Filled 2014-12-23: qty 2

## 2014-12-23 MED ORDER — ONDANSETRON HCL 4 MG/2ML IJ SOLN
INTRAMUSCULAR | Status: DC | PRN
  Administered 2014-12-23: 4 mg via INTRAVENOUS

## 2014-12-23 SURGICAL SUPPLY — 58 items
BANDAGE ELASTIC 3 VELCRO ST LF (GAUZE/BANDAGES/DRESSINGS) ×3 IMPLANT
BANDAGE ELASTIC 4 VELCRO ST LF (GAUZE/BANDAGES/DRESSINGS) ×3 IMPLANT
BIT DRILL 2.2 SS TIBIAL (BIT) ×2 IMPLANT
BNDG CMPR 9X4 STRL LF SNTH (GAUZE/BANDAGES/DRESSINGS)
BNDG ESMARK 4X9 LF (GAUZE/BANDAGES/DRESSINGS) IMPLANT
CANISTER SUCTION 1500CC (MISCELLANEOUS) ×3 IMPLANT
CHLORAPREP W/TINT 26ML (MISCELLANEOUS) ×3 IMPLANT
CORDS BIPOLAR (ELECTRODE) ×3 IMPLANT
COVER SURGICAL LIGHT HANDLE (MISCELLANEOUS) ×3 IMPLANT
CUFF TOURNIQUET SINGLE 18IN (TOURNIQUET CUFF) ×3 IMPLANT
CUFF TOURNIQUET SINGLE 24IN (TOURNIQUET CUFF) IMPLANT
DRAIN TLS ROUND 10FR (DRAIN) IMPLANT
DRAPE OEC MINIVIEW 54X84 (DRAPES) ×3 IMPLANT
DRAPE SURG 17X23 STRL (DRAPES) ×3 IMPLANT
GAUZE XEROFORM 1X8 LF (GAUZE/BANDAGES/DRESSINGS) ×3 IMPLANT
GAUZE XEROFORM 5X9 LF (GAUZE/BANDAGES/DRESSINGS) ×2 IMPLANT
GLOVE BIOGEL M STRL SZ7.5 (GLOVE) ×3 IMPLANT
GOWN STRL REUS W/ TWL XL LVL3 (GOWN DISPOSABLE) ×2 IMPLANT
GOWN STRL REUS W/TWL XL LVL3 (GOWN DISPOSABLE) ×6
K-WIRE 1.6 (WIRE) ×6
K-WIRE FX5X1.6XNS BN SS (WIRE) ×2
KIT BASIN OR (CUSTOM PROCEDURE TRAY) ×3 IMPLANT
KWIRE FX5X1.6XNS BN SS (WIRE) IMPLANT
NEEDLE HYPO 22GX1.5 SAFETY (NEEDLE) IMPLANT
NS IRRIG 1000ML POUR BTL (IV SOLUTION) ×3 IMPLANT
PACK ORTHO EXTREMITY (CUSTOM PROCEDURE TRAY) ×3 IMPLANT
PAD CAST 3X4 CTTN HI CHSV (CAST SUPPLIES) IMPLANT
PAD CAST 4YDX4 CTTN HI CHSV (CAST SUPPLIES) IMPLANT
PADDING CAST ABS 4INX4YD NS (CAST SUPPLIES) ×2
PADDING CAST ABS COTTON 4X4 ST (CAST SUPPLIES) ×1 IMPLANT
PADDING CAST COTTON 3X4 STRL (CAST SUPPLIES)
PADDING CAST COTTON 4X4 STRL (CAST SUPPLIES)
PEG LOCKING SMOOTH 2.2X18 (Peg) ×6 IMPLANT
PEG LOCKING SMOOTH 2.2X22 (Screw) ×6 IMPLANT
PLATE STANDARD DVR LEFT (Plate) ×3 IMPLANT
PLATE STD DVR LT 24X51 (Plate) IMPLANT
SCREW  LP NL 2.7X16MM (Screw) ×4 IMPLANT
SCREW 2.7X14MM (Screw) ×2 IMPLANT
SCREW BN 14X2.7XNONLOCK 3 LD (Screw) IMPLANT
SCREW LOCK 20X2.7X 3 LD TPR (Screw) ×1 IMPLANT
SCREW LOCKING 2.7X20MM (Screw) ×3 IMPLANT
SCREW LP NL 2.7X16MM (Screw) IMPLANT
SCREW NLOCK 2.7X14 (Screw) ×1 IMPLANT
SPLINT FIBERGLASS 4X30 (CAST SUPPLIES) ×2 IMPLANT
SPLINT PLASTER CAST XFAST 4X15 (CAST SUPPLIES) IMPLANT
SPLINT PLASTER XTRA FAST SET 4 (CAST SUPPLIES)
SUT ETHILON 3 0 PS 1 (SUTURE) ×3 IMPLANT
SUT ETHILON 4 0 PS 2 18 (SUTURE) ×3 IMPLANT
SUT VIC AB 3-0 PS1 18 (SUTURE) ×3
SUT VIC AB 3-0 PS1 18XBRD (SUTURE) ×1 IMPLANT
SUT VICRYL 4-0 PS2 18IN ABS (SUTURE) ×3 IMPLANT
SYR BULB 3OZ (MISCELLANEOUS) ×3 IMPLANT
SYR CONTROL 10ML LL (SYRINGE) IMPLANT
TOWEL OR 17X24 6PK STRL BLUE (TOWEL DISPOSABLE) ×3 IMPLANT
TUBE CONNECTING 20'X1/4 (TUBING) ×1
TUBE CONNECTING 20X1/4 (TUBING) ×2 IMPLANT
UNDERPAD 30X30 INCONTINENT (UNDERPADS AND DIAPERS) ×3 IMPLANT
WATER STERILE IRR 1000ML POUR (IV SOLUTION) ×3 IMPLANT

## 2014-12-23 NOTE — Anesthesia Postprocedure Evaluation (Signed)
Anesthesia Post Note  Patient: Daniel House  Procedure(s) Performed: Procedure(s) (LRB): OPEN REDUCTION INTERNAL FIXATION (ORIF) WRIST FRACTURE (Left)  Patient location during evaluation: PACU Anesthesia Type: General Level of consciousness: sedated Pain management: pain level controlled Vital Signs Assessment: post-procedure vital signs reviewed and stable Respiratory status: spontaneous breathing and respiratory function stable Cardiovascular status: stable Anesthetic complications: no    Last Vitals:  Filed Vitals:   12/23/14 1321 12/23/14 1336  BP: 140/103 137/97  Pulse: 93 93  Temp:    Resp: 23 18    Last Pain:  Filed Vitals:   12/23/14 1351  PainSc: 9                  Orin Eberwein DANIEL

## 2014-12-23 NOTE — ED Notes (Signed)
[facility] has been called.  

## 2014-12-23 NOTE — ED Notes (Addendum)
Harriet MassonKrystal Martin (sister) [phone]   Avis EpleyMelissa Gilden (sister) [phone]  Please call and notify [facility]

## 2014-12-23 NOTE — ED Notes (Signed)
[facility] notified that the Pt is ready for transport.

## 2014-12-23 NOTE — ED Notes (Signed)
Per [facility], Pt went upstairs to use the restroom and fell down a flight of stairs heard by family member. Family member went to see pt and found him at the bottom of the stairs with mangled wrist. When arrived to ED, Pt GCS 13 per ED and [facility]. Pt had screening completed with negative CT. Pt being transported for surgery to left wrist. Pt had dermabond placed on lip for laceration. Pt is resting at this time. Placed on the monitor.

## 2014-12-23 NOTE — Transfer of Care (Signed)
Immediate Anesthesia Transfer of Care Note  Patient: Daniel House  Procedure(s) Performed: Procedure(s): OPEN REDUCTION INTERNAL FIXATION (ORIF) WRIST FRACTURE (Left)  Patient Location: PACU  Anesthesia Type:General  Level of Consciousness: sedated  Airway & Oxygen Therapy: Patient Spontanous Breathing and Patient connected to nasal cannula oxygen  Post-op Assessment: Report given to RN and Post -op Vital signs reviewed and stable  Post vital signs: Reviewed and stable  Last Vitals:  Filed Vitals:   12/23/14 0945 12/23/14 1015  BP: 145/92 137/95  Pulse: 122 84  Temp:    Resp: 23 21    Complications: No apparent anesthesia complications

## 2014-12-23 NOTE — Anesthesia Preprocedure Evaluation (Addendum)
Anesthesia Evaluation  Patient identified by MRN, date of birth, ID band Patient confused    Reviewed: Allergy & Precautions, NPO status , Patient's Chart, lab work & pertinent test results  History of Anesthesia Complications Negative for: history of anesthetic complications  Airway Mallampati: II  TM Distance: >3 FB Neck ROM: Full    Dental  (+) Poor Dentition, Dental Advisory Given   Pulmonary pneumonia, resolved, former smoker,    Pulmonary exam normal        Cardiovascular negative cardio ROS Normal cardiovascular exam     Neuro/Psych negative psych ROS   GI/Hepatic negative GI ROS, (+) Hepatitis -, C  Endo/Other  negative endocrine ROS  Renal/GU Renal InsufficiencyRenal disease     Musculoskeletal   Abdominal   Peds  Hematology  (+) HIV,   Anesthesia Other Findings   Reproductive/Obstetrics                            Anesthesia Physical Anesthesia Plan  ASA: III  Anesthesia Plan: General   Post-op Pain Management:    Induction: Intravenous  Airway Management Planned: LMA  Additional Equipment:   Intra-op Plan:   Post-operative Plan: Extubation in OR  Informed Consent: I have reviewed the patients History and Physical, chart, labs and discussed the procedure including the risks, benefits and alternatives for the proposed anesthesia with the patient or authorized representative who has indicated his/her understanding and acceptance.   Dental advisory given  Plan Discussed with: CRNA, Anesthesiologist and Surgeon  Anesthesia Plan Comments:        Anesthesia Quick Evaluation

## 2014-12-23 NOTE — ED Notes (Signed)
Consent Signed By sister at the bedside.

## 2014-12-23 NOTE — ED Notes (Signed)
Per EMS pt was drinking tonight and per a family member pt went upstairs at home to use the bathroom when a family member heard a "thud" and found the pt at bottom of the stairs [place]. Pt was unresponsive for a few minutes and woke up altered and combative. Pt arrived on back board with C-collar in place.

## 2014-12-23 NOTE — ED Notes (Addendum)
Pt is confused. He is periodically cursing, talking about eggs and bacon, and saying "baby". PT difficult to exam due to his excitibilty.  Obvious deformity to left wrist. Good strong left radial pulse present. Pt has bruise noted to left eye. Pupils pinpoint and sluggish. He blood noted to the lip (laceration present from inside through to outside. Pt c/o dental and pain left wrist pain. No other obvious signs or injury noted to head. C-collar in place and splint in place on left wrist.

## 2014-12-23 NOTE — ED Notes (Signed)
MD Wickline at bedside. 

## 2014-12-23 NOTE — Anesthesia Procedure Notes (Signed)
Procedure Name: Intubation Date/Time: 12/23/2014 11:50 AM Performed by: Alanda AmassFRIEDMAN, Aleksandar Duve A Pre-anesthesia Checklist: Patient identified, Timeout performed, Emergency Drugs available, Suction available and Patient being monitored Patient Re-evaluated:Patient Re-evaluated prior to inductionOxygen Delivery Method: Circle system utilized Preoxygenation: Pre-oxygenation with 100% oxygen Intubation Type: IV induction, Rapid sequence and Cricoid Pressure applied Laryngoscope Size: Mac and 3 Grade View: Grade I Tube type: Oral Tube size: 7.5 mm Number of attempts: 1 Airway Equipment and Method: Stylet Placement Confirmation: ETT inserted through vocal cords under direct vision,  breath sounds checked- equal and bilateral and positive ETCO2 Secured at: 22 cm Tube secured with: Tape Dental Injury: Teeth and Oropharynx as per pre-operative assessment

## 2014-12-23 NOTE — ED Notes (Signed)
Family at bedside. 

## 2014-12-23 NOTE — Discharge Instructions (Signed)
Discharge Instructions:  Keep your dressing clean, dry and in place until instructed to remove by Dr. Avaneesh Pepitone.  If the dressing becomes dirty or wet call the office for instructions during business hours. Elevate the extremity to help with swelling, this will also help with any discomfort. Take your medication as prescribed. No lifting with the injured  extremity. If you feel that the dressing is too tight, you may loosen it, but keep it on; finger tips should be pink; if there is a concern, call the office. [phone] Ice may be used if the injury is a fracture, do not apply ice directly to the skin. Please call the office on the next business day after discharge to arrange a follow up appointment.  Call [phone] between the hours of 9am - 5pm M-Th or 9am - 1pm on Fri. For most hand injuries and/or conditions, you may return to work using the uninjured hand (one handed duty) within 24-72 hours.  A detailed note will be provided to you at your follow up appointment or may contact the office prior to your follow up.    

## 2014-12-23 NOTE — ED Provider Notes (Signed)
CSN: [id number]     Arrival date & time 12/23/14  4098 History   First MD Initiated Contact with Patient 12/23/14 0434     Chief Complaint  Patient presents with  . Fall  . Altered Mental Status  . Alcohol Intoxication   LEVEL 5 CAVEAT DUE TO ACUITY OF CONDITION   Patient is a 39 y.o. male presenting with fall. The history is provided by the patient, the EMS personnel and a relative. The history is limited by the condition of the patient.  Fall This is a new problem. Episode onset: PRIOR TO ARRIVAL. The problem occurs constantly. The problem has been rapidly worsening. Nothing aggravates the symptoms. Nothing relieves the symptoms.  patient presents after fall down stairs Per sister, pt was has been drinking ETOH He went upstairs to use restroom and then he was heard falling downstairs and found at bottom of stairs Apparently he was unresponsive then woke up combative.    No other details are known at arrival Pt appears in distress on arrival  Past Medical History  Diagnosis Date  . HIV infection 04/2011  . Chronic kidney disease   . Pneumonia 04/2011    PCP pneumonia   . 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 Hematochezia 05/31/2011   Past Surgical History  Procedure Laterality Date  . Appendectomy  1996   No family history on file. Social History  Substance Use Topics  . Smoking status: Former Smoker -- 0.30 packs/day for 20 years    Types: Cigarettes    Quit date: 01/13/2014  . Smokeless tobacco: Never Used     Comment: Quit 01/13/14!  Marland Kitchen Alcohol Use: No    Review of Systems  Unable to perform ROS: Acuity of condition      Allergies  Bactrim and Truvada  Home Medications   Prior to Admission medications   Medication Sig Start Date End Date Taking? Authorizing Provider  TRIUMEQ 600-50-300 MG TABS TAKE 1 TABLET BY MOUTH DAILY 11/20/14   Gardiner Barefoot, MD   BP 145/101 mmHg  Pulse 90  Resp 22  Wt 58.968 kg  SpO2 100% Physical  Exam CONSTITUTIONAL: Agitated, disheveled HEAD: Normocephalic EYES: EOMI/PERRL, no proptosis ENMT: Mucous membranes moist, no malocclusion, laceration noted to buccal mucosa and small laceration to just below lower lip.  Blood at nose but no septal hematoma Swelling/tenderness to left maxilla NECK: c-collar in place SPINE/BACK:diffuse CTL tenderness, no crepitus noted CV: S1/S2 noted, no murmurs/rubs/gallops noted LUNGS: distress noted, tachypneic ABDOMEN: diffuse tenderness NEURO: Pt is agitated and combative.  He moves all extremities x4.  He will follow some commands then become combative.  GCS - 13 EXTREMITIES: obvious deformity to left wrist (closed) pulses intact.  No other obvious extremity deformity SKIN: warm, color normal PSYCH: anxious  ED Course  Procedures  LACERATION REPAIR Performed by: Joya Gaskins Consent: Verbal consent obtained. Risks and benefits: risks, benefits and alternatives were discussed Patient identity confirmed: provided demographic data Time out performed prior to procedure Prepped and Draped in normal sterile fashion Wound explored Laceration Location: lower face Laceration Length: 0.5cm Amount of cleaning: standard Skin closure: simple Number of sutures or staples: dermabond Technique: dermabond Patient tolerance: Patient tolerated the procedure well with no immediate complications.  4:59 AM Pt seen soon after arrival He is intoxicated and combative after fall down steps I am unable to perform reliable chest/abd/spinal exam due to his intoxication and agitation He has obvious left wrist fx.  He may have head injury as  well Trauma imaging ordered 5:17 AM Pt still intermttently combative which made imaging difficult for left wrist CXR reviewed He will now go to CT imaging 5:52 AM CT imaging pending at this time 6:04 AM D/w dr Izora Ribas with Hand He will likely transfer patient to [facility] for operative repair of his left wrist He requests  splint for now 6:51 AM Pt stable Resting comfortably He has small facial fx, but otherwise no significant traumatic injury except for left distal radius fx Re-examined patient and no signs of trauma to lower extremities.  No signs of trauma to right UE I was able to use dermabond.  He has laceration to buccal mucosa but it is not gaping.  Due to his combativeness, will allow this to heal secondarily as he will likely not allow me to repair this Pt awaiting splint placement for now.  He will be transferred to [facility] D/w dr brian Hyacinth Meeker in [facility] ED about this patient Once he is in the [facility] ED Dr Izora Ribas will need to be called  For brain lesion, EPIC notes sent to PCP and ID physican for non-emergent MRI   Labs Review Labs Reviewed  COMPREHENSIVE METABOLIC PANEL - Abnormal; Notable for the following:    Glucose, Bld 117 (*)    Creatinine, Ser 1.35 (*)    All other components within normal limits  CBC - Abnormal; Notable for the following:    MCV 103.8 (*)    MCH 35.3 (*)    All other components within normal limits  ETHANOL - Abnormal; Notable for the following:    Alcohol, Ethyl (B) 288 (*)    All other components within normal limits  PROTIME-INR  CDS SEROLOGY  SAMPLE TO BLOOD BANK    Imaging Review Dg Wrist 2 Views Left  12/23/2014  CLINICAL DATA:  Larey Seat down stairs. EXAM: LEFT WRIST - 2 VIEW COMPARISON:  None. FINDINGS: There is an impacted intra-articular fracture of the distal radius with complete dorsal displacement and override. There is a transverse fracture across the base of the ulnar styloid. No dislocation. IMPRESSION: Displaced and overriding intra-articular fracture of the distal radius. Ulnar styloid fracture. Electronically Signed   By: Ellery Plunk M.D.   On: 12/23/2014 06:14   Ct Head Wo Contrast  12/23/2014  CLINICAL DATA:  Larey Seat down steps tonight. Loss of consciousness. Alcohol intake. EXAM: CT HEAD WITHOUT CONTRAST CT MAXILLOFACIAL WITHOUT CONTRAST CT CERVICAL  SPINE WITHOUT CONTRAST TECHNIQUE: Multidetector CT imaging of the head, cervical spine, and maxillofacial structures were performed using the standard protocol without intravenous contrast. Multiplanar CT image reconstructions of the cervical spine and maxillofacial structures were also generated. COMPARISON:  MRI head May 14, 2011 FINDINGS: CT HEAD FINDINGS The ventricles and sulci are normal. No intraparenchymal hemorrhage, mass effect nor midline shift. No acute large vascular territory infarcts. Patchy peripheral RIGHT parietal hypodensity and apparent volume loss, new from prior MRI. Patchy supratentorial white matter hypodensities are advanced for age. No abnormal extra-axial fluid collections. Basal cisterns are patent. No skull fracture. CT MAXILLOFACIAL FINDINGS Mildly motion degraded examination. The mandible appears intact in the condyles are located. Acute minimally displaced LEFT lamina papyracea fracture. Small amount of presumed blood products LEFT ethmoid air cell, no paranasal sinus air-fluid levels. Nasal septum is midline. Old RIGHT medial orbital blowout fracture, with RIGHT medial rectus at the defect. Ocular globes intact. Lenses are located. Preservation of the orbital fat. No retrobulbar hematoma. LEFT facial soft tissue swelling/hematoma. No subcutaneous gas or radiopaque foreign bodies. CT  CERVICAL SPINE FINDINGS Cervical vertebral bodies and posterior elements are intact and aligned with broad reversed cervical lordosis. Moderate to severe C5-6 disc height loss, moderate at C6-7, with multilevel uncovertebral hypertrophy and ventral endplate spurring. Severe bilateral C5-6 and LEFT C6-7 neural foraminal narrowing. No destructive bony lesions. C1-2 articulation maintained. Included prevertebral and paraspinal soft tissues are unremarkable. RIGHT apical bullous changes. IMPRESSION: CT HEAD: New peripheral RIGHT parietal hypodensity, likely representing encephalomalacia though new from  prior study. No convincing evidence of acute intracranial process. Recommend followup MRI of the brain on a nonemergent basis for further characterization. Mild white matter changes suggest chronic small vessel ischemic disease, advanced for age. CT MAXILLOFACIAL: Acute minimally displaced LEFT lamina papyracea fracture. Mild LEFT ethmoid hemo sinus. Old RIGHT medial orbital blowout fracture. CT CERVICAL SPINE: Broad reversed cervical lordosis without acute fracture or malalignment. Severe C5-6 and LEFT C6-7 neural foraminal narrowing. Electronically Signed   By: Awilda Metro M.D.   On: 12/23/2014 06:03   Ct Chest W Contrast  12/23/2014  CLINICAL DATA:  Larey Seat down steps. EXAM: CT CHEST, ABDOMEN, AND PELVIS WITH CONTRAST TECHNIQUE: Multidetector CT imaging of the chest, abdomen and pelvis was performed following the standard protocol during bolus administration of intravenous contrast. CONTRAST:  OMNIPAQUE IOHEXOL 300 MG/ML  SOLN COMPARISON:  04/27/2011 FINDINGS: CT CHEST FINDINGS Mediastinum/Nodes: Mediastinum and intrathoracic vascular structures are intact. Lungs/Pleura: The lungs are clear. There are paraseptal emphysematous changes in the upper lobes. Central airways are patent and intact. There is no pneumothorax. There are no effusions. Musculoskeletal: Sternum and vertebral column appear intact. Incidentally noted fractures of the distal left radius and ulna. CT ABDOMEN PELVIS FINDINGS Hepatobiliary: The liver, gallbladder and bile ducts appear intact. Pancreas: Normal Spleen: Normal Adrenals/Urinary Tract: The adrenals and kidneys are normal in appearance. There is no urinary calculus evident. There is no hydronephrosis or ureteral dilatation. Collecting systems and ureters appear unremarkable. Stomach/Bowel: Bowel appears intact. No peritoneal blood or free air. Vascular/Lymphatic: No evidence of an intra abdominal vascular injury. Abdominal aorta is normal in caliber without atherosclerotic  calcification. Reproductive: Unremarkable Musculoskeletal: There is a unilateral right L5 pars defect. Negative for acute fracture. IMPRESSION: 1. Fractures of the distal left radius and ulna. 2. Negative for acute traumatic injury in the chest, abdomen or pelvis. 3. Upper lobe emphysematous changes bilaterally. 4. Unilateral right L5 pars defect. Electronically Signed   By: Ellery Plunk M.D.   On: 12/23/2014 05:56   Ct Cervical Spine Wo Contrast  12/23/2014  CLINICAL DATA:  Larey Seat down steps tonight. Loss of consciousness. Alcohol intake. EXAM: CT HEAD WITHOUT CONTRAST CT MAXILLOFACIAL WITHOUT CONTRAST CT CERVICAL SPINE WITHOUT CONTRAST TECHNIQUE: Multidetector CT imaging of the head, cervical spine, and maxillofacial structures were performed using the standard protocol without intravenous contrast. Multiplanar CT image reconstructions of the cervical spine and maxillofacial structures were also generated. COMPARISON:  MRI head May 14, 2011 FINDINGS: CT HEAD FINDINGS The ventricles and sulci are normal. No intraparenchymal hemorrhage, mass effect nor midline shift. No acute large vascular territory infarcts. Patchy peripheral RIGHT parietal hypodensity and apparent volume loss, new from prior MRI. Patchy supratentorial white matter hypodensities are advanced for age. No abnormal extra-axial fluid collections. Basal cisterns are patent. No skull fracture. CT MAXILLOFACIAL FINDINGS Mildly motion degraded examination. The mandible appears intact in the condyles are located. Acute minimally displaced LEFT lamina papyracea fracture. Small amount of presumed blood products LEFT ethmoid air cell, no paranasal sinus air-fluid levels. Nasal septum is midline. Old RIGHT  medial orbital blowout fracture, with RIGHT medial rectus at the defect. Ocular globes intact. Lenses are located. Preservation of the orbital fat. No retrobulbar hematoma. LEFT facial soft tissue swelling/hematoma. No subcutaneous gas or radiopaque  foreign bodies. CT CERVICAL SPINE FINDINGS Cervical vertebral bodies and posterior elements are intact and aligned with broad reversed cervical lordosis. Moderate to severe C5-6 disc height loss, moderate at C6-7, with multilevel uncovertebral hypertrophy and ventral endplate spurring. Severe bilateral C5-6 and LEFT C6-7 neural foraminal narrowing. No destructive bony lesions. C1-2 articulation maintained. Included prevertebral and paraspinal soft tissues are unremarkable. RIGHT apical bullous changes. IMPRESSION: CT HEAD: New peripheral RIGHT parietal hypodensity, likely representing encephalomalacia though new from prior study. No convincing evidence of acute intracranial process. Recommend followup MRI of the brain on a nonemergent basis for further characterization. Mild white matter changes suggest chronic small vessel ischemic disease, advanced for age. CT MAXILLOFACIAL: Acute minimally displaced LEFT lamina papyracea fracture. Mild LEFT ethmoid hemo sinus. Old RIGHT medial orbital blowout fracture. CT CERVICAL SPINE: Broad reversed cervical lordosis without acute fracture or malalignment. Severe C5-6 and LEFT C6-7 neural foraminal narrowing. Electronically Signed   By: Awilda Metroourtnay  Bloomer M.D.   On: 12/23/2014 06:03   Ct Abdomen Pelvis W Contrast  12/23/2014  CLINICAL DATA:  Larey SeatFell down steps. EXAM: CT CHEST, ABDOMEN, AND PELVIS WITH CONTRAST TECHNIQUE: Multidetector CT imaging of the chest, abdomen and pelvis was performed following the standard protocol during bolus administration of intravenous contrast. CONTRAST:  100mL OMNIPAQUE IOHEXOL 300 MG/ML  SOLN COMPARISON:  04/27/2011 FINDINGS: CT CHEST FINDINGS Mediastinum/Nodes: Mediastinum and intrathoracic vascular structures are intact. Lungs/Pleura: The lungs are clear. There are paraseptal emphysematous changes in the upper lobes. Central airways are patent and intact. There is no pneumothorax. There are no effusions. Musculoskeletal: Sternum and vertebral  column appear intact. Incidentally noted fractures of the distal left radius and ulna. CT ABDOMEN PELVIS FINDINGS Hepatobiliary: The liver, gallbladder and bile ducts appear intact. Pancreas: Normal Spleen: Normal Adrenals/Urinary Tract: The adrenals and kidneys are normal in appearance. There is no urinary calculus evident. There is no hydronephrosis or ureteral dilatation. Collecting systems and ureters appear unremarkable. Stomach/Bowel: Bowel appears intact. No peritoneal blood or free air. Vascular/Lymphatic: No evidence of an intra abdominal vascular injury. Abdominal aorta is normal in caliber without atherosclerotic calcification. Reproductive: Unremarkable Musculoskeletal: There is a unilateral right L5 pars defect. Negative for acute fracture. IMPRESSION: 1. Fractures of the distal left radius and ulna. 2. Negative for acute traumatic injury in the chest, abdomen or pelvis. 3. Upper lobe emphysematous changes bilaterally. 4. Unilateral right L5 pars defect. Electronically Signed   By: Ellery Plunkaniel R Mitchell M.D.   On: 12/23/2014 05:56   Dg Chest Portable 1 View  12/23/2014  CLINICAL DATA:  Unwitnessed fall.  Probably fell down stairs. EXAM: PORTABLE CHEST 1 VIEW COMPARISON:  None. FINDINGS: The lungs are clear. Mediastinal contours are normal. No pneumothorax or large effusion. IMPRESSION: No acute findings. Electronically Signed   By: Ellery Plunkaniel R Mitchell M.D.   On: 12/23/2014 05:57   Ct Maxillofacial Wo Cm  12/23/2014  CLINICAL DATA:  Larey SeatFell down steps tonight. Loss of consciousness. Alcohol intake. EXAM: CT HEAD WITHOUT CONTRAST CT MAXILLOFACIAL WITHOUT CONTRAST CT CERVICAL SPINE WITHOUT CONTRAST TECHNIQUE: Multidetector CT imaging of the head, cervical spine, and maxillofacial structures were performed using the standard protocol without intravenous contrast. Multiplanar CT image reconstructions of the cervical spine and maxillofacial structures were also generated. COMPARISON:  MRI head May 14, 2011  FINDINGS: CT HEAD  FINDINGS The ventricles and sulci are normal. No intraparenchymal hemorrhage, mass effect nor midline shift. No acute large vascular territory infarcts. Patchy peripheral RIGHT parietal hypodensity and apparent volume loss, new from prior MRI. Patchy supratentorial white matter hypodensities are advanced for age. No abnormal extra-axial fluid collections. Basal cisterns are patent. No skull fracture. CT MAXILLOFACIAL FINDINGS Mildly motion degraded examination. The mandible appears intact in the condyles are located. Acute minimally displaced LEFT lamina papyracea fracture. Small amount of presumed blood products LEFT ethmoid air cell, no paranasal sinus air-fluid levels. Nasal septum is midline. Old RIGHT medial orbital blowout fracture, with RIGHT medial rectus at the defect. Ocular globes intact. Lenses are located. Preservation of the orbital fat. No retrobulbar hematoma. LEFT facial soft tissue swelling/hematoma. No subcutaneous gas or radiopaque foreign bodies. CT CERVICAL SPINE FINDINGS Cervical vertebral bodies and posterior elements are intact and aligned with broad reversed cervical lordosis. Moderate to severe C5-6 disc height loss, moderate at C6-7, with multilevel uncovertebral hypertrophy and ventral endplate spurring. Severe bilateral C5-6 and LEFT C6-7 neural foraminal narrowing. No destructive bony lesions. C1-2 articulation maintained. Included prevertebral and paraspinal soft tissues are unremarkable. RIGHT apical bullous changes. IMPRESSION: CT HEAD: New peripheral RIGHT parietal hypodensity, likely representing encephalomalacia though new from prior study. No convincing evidence of acute intracranial process. Recommend followup MRI of the brain on a nonemergent basis for further characterization. Mild white matter changes suggest chronic small vessel ischemic disease, advanced for age. CT MAXILLOFACIAL: Acute minimally displaced LEFT lamina papyracea fracture. Mild LEFT ethmoid  hemo sinus. Old RIGHT medial orbital blowout fracture. CT CERVICAL SPINE: Broad reversed cervical lordosis without acute fracture or malalignment. Severe C5-6 and LEFT C6-7 neural foraminal narrowing. Electronically Signed   By: Awilda Metro M.D.   On: 12/23/2014 06:03   I have personally reviewed and evaluated these images and lab results as part of my medical decision-making.    MDM   Final diagnoses:  Pain  Laceration of face, initial encounter  Distal radius fracture, left, closed, initial encounter  Facial contusion, initial encounter  Alcohol intoxication, with delirium (HCC)  Brain lesion  Closed fracture of facial bone, unspecified facial bone, initial encounter [facility])    Nursing notes including past medical history and social history reviewed and considered in documentation xrays/imaging reviewed by myself and considered during evaluation Labs/vital reviewed myself and considered during evaluation     Zadie Rhine, MD 12/23/14 [phone]

## 2014-12-23 NOTE — ED Notes (Signed)
Ortho has been called for sugar tong splint.

## 2014-12-23 NOTE — H&P (Signed)
Reason for Consult:fracture L wrist Referring Physician: ER  CC:Pt intoxicated, sleeping, unable to explain details, doesn't remember  HPI:  Daniel House is an 39 y.o. right handed male who presents with  Fall and fracture of l wrist, w/u with multiple CT's negative except for L wrist      .     Associated signs/symptoms: unable Previous treatment:    Past Medical History  Diagnosis Date  . HIV infection ([place]) 04/2011  . Chronic kidney disease   . Pneumonia 04/2011    PCP pneumonia   . 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 Hematochezia 05/31/2011    Past Surgical History  Procedure Laterality Date  . Appendectomy  1996    No family history on file.  Social History:  reports that he quit smoking about 11 months ago. His smoking use included Cigarettes. He has a 6 pack-year smoking history. He has never used smokeless tobacco. He reports that he does not drink alcohol or use illicit drugs.  Allergies:  Allergies  Allergen Reactions  . Bactrim [Sulfamethoxazole-Trimethoprim] Other (See Comments)    Renal Failure  . Truvada [Emtricitabine-Tenofovir Df] Other (See Comments)    Renal Failure    Medications: I have reviewed the patient's current medications.  Results for orders placed or performed during the hospital encounter of 12/23/14 (from the past 48 hour(s))  Comprehensive metabolic panel     Status: Abnormal   Collection Time: 12/23/14  4:53 AM  Result Value Ref Range   Sodium 142 135 - 145 mmol/L   Potassium 3.7 3.5 - 5.1 mmol/L   Chloride 106 101 - 111 mmol/L   CO2 26 22 - 32 mmol/L   Glucose, Bld 117 (H) 65 - 99 mg/dL   BUN 16 6 - 20 mg/dL   Creatinine, Ser 1.35 (H) 0.61 - 1.24 mg/dL   Calcium 9.3 8.9 - 10.3 mg/dL   Total Protein 8.0 6.5 - 8.1 g/dL   Albumin 4.6 3.5 - 5.0 g/dL   AST 29 15 - 41 U/L   ALT 21 17 - 63 U/L   Alkaline Phosphatase 81 38 - 126 U/L   Total Bilirubin 0.6 0.3 - 1.2 mg/dL   GFR calc non Af Amer >60 >60 mL/min   GFR  calc Af Amer >60 >60 mL/min    Comment: (NOTE) The eGFR has been calculated using the CKD EPI equation. This calculation has not been validated in all clinical situations. eGFR's persistently <60 mL/min signify possible Chronic Kidney Disease.    Anion gap 10 5 - 15  CBC     Status: Abnormal   Collection Time: 12/23/14  4:53 AM  Result Value Ref Range   WBC 9.7 4.0 - 10.5 K/uL   RBC 4.22 4.22 - 5.81 MIL/uL   Hemoglobin 14.9 13.0 - 17.0 g/dL   HCT 43.8 39.0 - 52.0 %   MCV 103.8 (H) 78.0 - 100.0 fL   MCH 35.3 (H) 26.0 - 34.0 pg   MCHC 34.0 30.0 - 36.0 g/dL   RDW 12.5 11.5 - 15.5 %   Platelets 211 150 - 400 K/uL  Ethanol     Status: Abnormal   Collection Time: 12/23/14  4:53 AM  Result Value Ref Range   Alcohol, Ethyl (B) 288 (H) <5 mg/dL    Comment:        LOWEST DETECTABLE LIMIT FOR SERUM ALCOHOL IS 5 mg/dL FOR MEDICAL PURPOSES ONLY   Protime-INR     Status: None   Collection  Time: 12/23/14  4:53 AM  Result Value Ref Range   Prothrombin Time 12.6 11.6 - 15.2 seconds   INR 0.92 0.00 - 1.49  Sample to Blood Bank     Status: None   Collection Time: 12/23/14  4:53 AM  Result Value Ref Range   Blood Bank Specimen SAMPLE AVAILABLE FOR TESTING    Sample Expiration 12/26/2014   Urine rapid drug screen (hosp performed)     Status: Abnormal   Collection Time: 12/23/14  8:20 AM  Result Value Ref Range   Opiates NONE DETECTED NONE DETECTED   Cocaine NONE DETECTED NONE DETECTED   Benzodiazepines NONE DETECTED NONE DETECTED   Amphetamines NONE DETECTED NONE DETECTED   Tetrahydrocannabinol POSITIVE (A) NONE DETECTED   Barbiturates NONE DETECTED NONE DETECTED    Comment:        DRUG SCREEN FOR MEDICAL PURPOSES ONLY.  IF CONFIRMATION IS NEEDED FOR ANY PURPOSE, NOTIFY LAB WITHIN 5 DAYS.        LOWEST DETECTABLE LIMITS FOR URINE DRUG SCREEN Drug Class       Cutoff (ng/mL) Amphetamine      1000 Barbiturate      200 Benzodiazepine   160 Tricyclics       737 Opiates           300 Cocaine          300 THC              50     Dg Wrist 2 Views Left  12/23/2014  CLINICAL DATA:  [place] down stairs. EXAM: LEFT WRIST - 2 VIEW COMPARISON:  None. FINDINGS: There is an impacted intra-articular fracture of the distal radius with complete dorsal displacement and override. There is a transverse fracture across the base of the ulnar styloid. No dislocation. IMPRESSION: Displaced and overriding intra-articular fracture of the distal radius. Ulnar styloid fracture. Electronically Signed   By: Andreas Newport M.D.   On: 12/23/2014 06:14   Ct Head Wo Contrast  12/23/2014  CLINICAL DATA:  [place] down steps tonight. Loss of consciousness. Alcohol intake. EXAM: CT HEAD WITHOUT CONTRAST CT MAXILLOFACIAL WITHOUT CONTRAST CT CERVICAL SPINE WITHOUT CONTRAST TECHNIQUE: Multidetector CT imaging of the head, cervical spine, and maxillofacial structures were performed using the standard protocol without intravenous contrast. Multiplanar CT image reconstructions of the cervical spine and maxillofacial structures were also generated. COMPARISON:  MRI head May 14, 2011 FINDINGS: CT HEAD FINDINGS The ventricles and sulci are normal. No intraparenchymal hemorrhage, mass effect nor midline shift. No acute large vascular territory infarcts. Patchy peripheral RIGHT parietal hypodensity and apparent volume loss, new from prior MRI. Patchy supratentorial white matter hypodensities are advanced for age. No abnormal extra-axial fluid collections. Basal cisterns are patent. No skull fracture. CT MAXILLOFACIAL FINDINGS Mildly motion degraded examination. The mandible appears intact in the condyles are located. Acute minimally displaced LEFT lamina papyracea fracture. Small amount of presumed blood products LEFT ethmoid air cell, no paranasal sinus air-fluid levels. Nasal septum is midline. Old RIGHT medial orbital blowout fracture, with RIGHT medial rectus at the defect. Ocular globes intact. Lenses are located.  Preservation of the orbital fat. No retrobulbar hematoma. LEFT facial soft tissue swelling/hematoma. No subcutaneous gas or radiopaque foreign bodies. CT CERVICAL SPINE FINDINGS Cervical vertebral bodies and posterior elements are intact and aligned with broad reversed cervical lordosis. Moderate to severe C5-6 disc height loss, moderate at C6-7, with multilevel uncovertebral hypertrophy and ventral endplate spurring. Severe bilateral C5-6 and LEFT C6-7 neural foraminal narrowing.  No destructive bony lesions. C1-2 articulation maintained. Included prevertebral and paraspinal soft tissues are unremarkable. RIGHT apical bullous changes. IMPRESSION: CT HEAD: New peripheral RIGHT parietal hypodensity, likely representing encephalomalacia though new from prior study. No convincing evidence of acute intracranial process. Recommend followup MRI of the brain on a nonemergent basis for further characterization. Mild white matter changes suggest chronic small vessel ischemic disease, advanced for age. CT MAXILLOFACIAL: Acute minimally displaced LEFT lamina papyracea fracture. Mild LEFT ethmoid hemo sinus. Old RIGHT medial orbital blowout fracture. CT CERVICAL SPINE: Broad reversed cervical lordosis without acute fracture or malalignment. Severe C5-6 and LEFT C6-7 neural foraminal narrowing. Electronically Signed   By: Elon Alas M.D.   On: 12/23/2014 06:03   Ct Chest W Contrast  12/23/2014  CLINICAL DATA:  [place] down steps. EXAM: CT CHEST, ABDOMEN, AND PELVIS WITH CONTRAST TECHNIQUE: Multidetector CT imaging of the chest, abdomen and pelvis was performed following the standard protocol during bolus administration of intravenous contrast. CONTRAST:  161m OMNIPAQUE IOHEXOL 300 MG/ML  SOLN COMPARISON:  04/27/2011 FINDINGS: CT CHEST FINDINGS Mediastinum/Nodes: Mediastinum and intrathoracic vascular structures are intact. Lungs/Pleura: The lungs are clear. There are paraseptal emphysematous changes in the upper lobes.  Central airways are patent and intact. There is no pneumothorax. There are no effusions. Musculoskeletal: Sternum and vertebral column appear intact. Incidentally noted fractures of the distal left radius and ulna. CT ABDOMEN PELVIS FINDINGS Hepatobiliary: The liver, gallbladder and bile ducts appear intact. Pancreas: Normal Spleen: Normal Adrenals/Urinary Tract: The adrenals and kidneys are normal in appearance. There is no urinary calculus evident. There is no hydronephrosis or ureteral dilatation. Collecting systems and ureters appear unremarkable. Stomach/Bowel: Bowel appears intact. No peritoneal blood or free air. Vascular/Lymphatic: No evidence of an intra abdominal vascular injury. Abdominal aorta is normal in caliber without atherosclerotic calcification. Reproductive: Unremarkable Musculoskeletal: There is a unilateral right L5 pars defect. Negative for acute fracture. IMPRESSION: 1. Fractures of the distal left radius and ulna. 2. Negative for acute traumatic injury in the chest, abdomen or pelvis. 3. Upper lobe emphysematous changes bilaterally. 4. Unilateral right L5 pars defect. Electronically Signed   By: DAndreas NewportM.D.   On: 12/23/2014 05:56   Ct Cervical Spine Wo Contrast  12/23/2014  CLINICAL DATA:  [place] tonight. Loss of consciousness. Alcohol intake. EXAM: CT HEAD WITHOUT CONTRAST CT MAXILLOFACIAL WITHOUT CONTRAST CT CERVICAL SPINE WITHOUT CONTRAST TECHNIQUE: Multidetector CT imaging of the head, cervical spine, and maxillofacial structures were performed using the standard protocol without intravenous contrast. Multiplanar CT image reconstructions of the cervical spine and maxillofacial structures were also generated. COMPARISON:  MRI head May 14, 2011 FINDINGS: CT HEAD FINDINGS The ventricles and sulci are normal. No intraparenchymal hemorrhage, mass effect nor midline shift. No acute large vascular territory infarcts. Patchy peripheral RIGHT parietal hypodensity and  apparent volume loss, new from prior MRI. Patchy supratentorial white matter hypodensities are advanced for age. No abnormal extra-axial fluid collections. Basal cisterns are patent. No skull fracture. CT MAXILLOFACIAL FINDINGS Mildly motion degraded examination. The mandible appears intact in the condyles are located. Acute minimally displaced LEFT lamina papyracea fracture. Small amount of presumed blood products LEFT ethmoid air cell, no paranasal sinus air-fluid levels. Nasal septum is midline. Old RIGHT medial orbital blowout fracture, with RIGHT medial rectus at the defect. Ocular globes intact. Lenses are located. Preservation of the orbital fat. No retrobulbar hematoma. LEFT facial soft tissue swelling/hematoma. No subcutaneous gas or radiopaque foreign bodies. CT CERVICAL SPINE FINDINGS Cervical vertebral bodies and  posterior elements are intact and aligned with broad reversed cervical lordosis. Moderate to severe C5-6 disc height loss, moderate at C6-7, with multilevel uncovertebral hypertrophy and ventral endplate spurring. Severe bilateral C5-6 and LEFT C6-7 neural foraminal narrowing. No destructive bony lesions. C1-2 articulation maintained. Included prevertebral and paraspinal soft tissues are unremarkable. RIGHT apical bullous changes. IMPRESSION: CT HEAD: New peripheral RIGHT parietal hypodensity, likely representing encephalomalacia though new from prior study. No convincing evidence of acute intracranial process. Recommend followup MRI of the brain on a nonemergent basis for further characterization. Mild white matter changes suggest chronic small vessel ischemic disease, advanced for age. CT MAXILLOFACIAL: Acute minimally displaced LEFT lamina papyracea fracture. Mild LEFT ethmoid hemo sinus. Old RIGHT medial orbital blowout fracture. CT CERVICAL SPINE: Broad reversed cervical lordosis without acute fracture or malalignment. Severe C5-6 and LEFT C6-7 neural foraminal narrowing. Electronically  Signed   By: Elon Alas M.D.   On: 12/23/2014 06:03   Ct Abdomen Pelvis W Contrast  12/23/2014  CLINICAL DATA:  [place] down steps. EXAM: CT CHEST, ABDOMEN, AND PELVIS WITH CONTRAST TECHNIQUE: Multidetector CT imaging of the chest, abdomen and pelvis was performed following the standard protocol during bolus administration of intravenous contrast. CONTRAST:  163m OMNIPAQUE IOHEXOL 300 MG/ML  SOLN COMPARISON:  04/27/2011 FINDINGS: CT CHEST FINDINGS Mediastinum/Nodes: Mediastinum and intrathoracic vascular structures are intact. Lungs/Pleura: The lungs are clear. There are paraseptal emphysematous changes in the upper lobes. Central airways are patent and intact. There is no pneumothorax. There are no effusions. Musculoskeletal: Sternum and vertebral column appear intact. Incidentally noted fractures of the distal left radius and ulna. CT ABDOMEN PELVIS FINDINGS Hepatobiliary: The liver, gallbladder and bile ducts appear intact. Pancreas: Normal Spleen: Normal Adrenals/Urinary Tract: The adrenals and kidneys are normal in appearance. There is no urinary calculus evident. There is no hydronephrosis or ureteral dilatation. Collecting systems and ureters appear unremarkable. Stomach/Bowel: Bowel appears intact. No peritoneal blood or free air. Vascular/Lymphatic: No evidence of an intra abdominal vascular injury. Abdominal aorta is normal in caliber without atherosclerotic calcification. Reproductive: Unremarkable Musculoskeletal: There is a unilateral right L5 pars defect. Negative for acute fracture. IMPRESSION: 1. Fractures of the distal left radius and ulna. 2. Negative for acute traumatic injury in the chest, abdomen or pelvis. 3. Upper lobe emphysematous changes bilaterally. 4. Unilateral right L5 pars defect. Electronically Signed   By: DAndreas NewportM.D.   On: 12/23/2014 05:56   Dg Chest Portable 1 View  12/23/2014  CLINICAL DATA:  Unwitnessed fall.  Probably fell down stairs. EXAM: PORTABLE CHEST  1 VIEW COMPARISON:  None. FINDINGS: The lungs are clear. Mediastinal contours are normal. No pneumothorax or large effusion. IMPRESSION: No acute findings. Electronically Signed   By: DAndreas NewportM.D.   On: 12/23/2014 05:57   Ct Maxillofacial Wo Cm  12/23/2014  CLINICAL DATA:  [place] tonight. Loss of consciousness. Alcohol intake. EXAM: CT HEAD WITHOUT CONTRAST CT MAXILLOFACIAL WITHOUT CONTRAST CT CERVICAL SPINE WITHOUT CONTRAST TECHNIQUE: Multidetector CT imaging of the head, cervical spine, and maxillofacial structures were performed using the standard protocol without intravenous contrast. Multiplanar CT image reconstructions of the cervical spine and maxillofacial structures were also generated. COMPARISON:  MRI head May 14, 2011 FINDINGS: CT HEAD FINDINGS The ventricles and sulci are normal. No intraparenchymal hemorrhage, mass effect nor midline shift. No acute large vascular territory infarcts. Patchy peripheral RIGHT parietal hypodensity and apparent volume loss, new from prior MRI. Patchy supratentorial white matter hypodensities are advanced for age. No abnormal extra-axial  fluid collections. Basal cisterns are patent. No skull fracture. CT MAXILLOFACIAL FINDINGS Mildly motion degraded examination. The mandible appears intact in the condyles are located. Acute minimally displaced LEFT lamina papyracea fracture. Small amount of presumed blood products LEFT ethmoid air cell, no paranasal sinus air-fluid levels. Nasal septum is midline. Old RIGHT medial orbital blowout fracture, with RIGHT medial rectus at the defect. Ocular globes intact. Lenses are located. Preservation of the orbital fat. No retrobulbar hematoma. LEFT facial soft tissue swelling/hematoma. No subcutaneous gas or radiopaque foreign bodies. CT CERVICAL SPINE FINDINGS Cervical vertebral bodies and posterior elements are intact and aligned with broad reversed cervical lordosis. Moderate to severe C5-6 disc height loss,  moderate at C6-7, with multilevel uncovertebral hypertrophy and ventral endplate spurring. Severe bilateral C5-6 and LEFT C6-7 neural foraminal narrowing. No destructive bony lesions. C1-2 articulation maintained. Included prevertebral and paraspinal soft tissues are unremarkable. RIGHT apical bullous changes. IMPRESSION: CT HEAD: New peripheral RIGHT parietal hypodensity, likely representing encephalomalacia though new from prior study. No convincing evidence of acute intracranial process. Recommend followup MRI of the brain on a nonemergent basis for further characterization. Mild white matter changes suggest chronic small vessel ischemic disease, advanced for age. CT MAXILLOFACIAL: Acute minimally displaced LEFT lamina papyracea fracture. Mild LEFT ethmoid hemo sinus. Old RIGHT medial orbital blowout fracture. CT CERVICAL SPINE: Broad reversed cervical lordosis without acute fracture or malalignment. Severe C5-6 and LEFT C6-7 neural foraminal narrowing. Electronically Signed   By: Elon Alas M.D.   On: 12/23/2014 06:03    Pertinent items are noted in HPI. Temp:  [98.5 F (36.9 C)] 98.5 F (36.9 C) (12/03 0852) Pulse Rate:  [84-122] 84 (12/03 1015) Resp:  [19-23] 21 (12/03 1015) BP: (115-145)/(83-101) 137/95 mmHg (12/03 1015) SpO2:  [96 %-100 %] 98 % (12/03 1015) Weight:  [58.968 kg (130 lb)] 58.968 kg (130 lb) (12/03 0444) General appearance: appears older than stated age and no distress Cardio: regular rate and rhythm Extremities: L wrist with obvious deformity, no open lacerations, distally grossly n/v intact   Assessment: L colsed comminuted distal radius and ulnar styloid fracture Plan: OR for surgery I have discussed this treatment plan in detail with patient and family, including the risks of the recommended treatment or surgery, the benefits and the alternatives.  The patient and caregiver understands that additional treatment may be necessary.  Fareedah Mahler  CHRISTOPHER 12/23/2014, 11:41 AM

## 2014-12-23 NOTE — ED Notes (Signed)
Pt remains confused and restless.  Pt mumbling incoherently as this RN assisted the Ortho Tech w/ splint placement.

## 2014-12-23 NOTE — ED Notes (Signed)
Bed: WA17 Expected date:  Expected time:  Means of arrival:  Comments:  EMS 56M etoh fall down stops +LOC, deformed wrist, blood in mouth

## 2014-12-23 NOTE — ED Notes (Signed)
Sister, Adella NissenKristal left the room and left belongings in the room with patient. Pt's belongings being transported upstairs with patient at this time.

## 2014-12-23 NOTE — ED Provider Notes (Signed)
Pt sleeping comfortably. Nursing talked with Dr. Kristine Lineaooley.  Daniel Ledyard Randall [name], MD 12/23/14 [phone]

## 2014-12-23 NOTE — ED Notes (Signed)
Report given to Gaylyn RongKris RN at [facility].

## 2014-12-24 LAB — SAMPLE TO BLOOD BANK

## 2014-12-24 NOTE — Op Note (Signed)
[name]:  Daniel House, Daniel House                 ACCOUNT NO.:  [id number]  MEDICAL RECORD NO.:  [id number]  LOCATION:  [facility]                         FACILITY:  [facility]  PHYSICIAN:  Daniel AbrahamHarrill C Dakarri Kessinger, Daniel House    DATE OF BIRTH:  January 15, 1976  DATE OF PROCEDURE:  12/23/2014 DATE OF DISCHARGE:  12/23/2014                              OPERATIVE REPORT   PREOPERATIVE DIAGNOSIS:  Closed displaced fracture of the left distal radius and ulnar styloid.  POSTOPERATIVE DIAGNOSIS:  Closed displaced fracture of the left distal radius and ulnar styloid.  PROCEDURE:  Open reduction and internal fixation of the left distal radius with a [employer] DVR volar plate.  ANESTHESIA:  General.  ESTIMATED BLOOD LOSS:  Minimal.  TOURNIQUET TIME:  Approximately 35 minutes.  COMPLICATIONS:  No acute complications.  SPECIMENS:  No specimens.  INDICATIONS:  Daniel House is a 39 year old gentleman, who was intoxicated and fell down some steps, sustaining closed fracture to his left wrist.  He was worked up by the emergency room with multiple CT scans.  He was cleared for surgery.  Consent was obtained from a family member due to the patient's intoxicated status.  PROCEDURE IN DETAIL:  The patient was taken to the operating room, placed supine on the operating table.  A time-out was performed.  The left upper extremity was prepped and draped in the normal sterile fashion.  An Esmarch was used to exsanguinate the extremity.  Tourniquet was inflated to 250 mmHg.  A volar incision overlying the FCR tendon was then made.  Dissection was carried down through the fascia.  There was quite a bit of combination and the radial styloid fragment was tenting the skin.  The pronator quadratus muscle was, the distal portion was disrupted by the fracture.  The proximal portion was incised, exposing the fracture site and proximal radius.  Reduction of the fracture was then performed.  X-ray was brought into place confirming a near  anatomic reduction.  An appropriate size DVR volar plate was chosen and placed over the distal radius.  This was held in place with 2 temporary K-wires while x-ray revealed good plate placement.  Afterwards the radial shaft screws reached and drilled, measured, and appropriate size cortical screws were placed.  Following each of the distal radius screws were each measured and appropriate-sized locking screws were placed. Postoperative films revealed near anatomic reduction with good plate and screw placement.  The wound was irrigated.  Deep layers were closed with interrupted 3-0 Vicryl.  The skin was closed with a running 4-0 Vicryl.  A sterile dressing and a volar splint were placed.  The patient tolerated procedure well and was taken to the recovery room in stable.     Daniel AbrahamHarrill C Capri Raben, Daniel House     HCC/MEDQ  D:  12/23/2014  T:  12/24/2014  Job:  [id number]

## 2014-12-25 ENCOUNTER — Encounter (HOSPITAL_COMMUNITY): Payer: Self-pay | Admitting: General Surgery

## 2015-01-01 LAB — CDS SEROLOGY

## 2015-01-02 ENCOUNTER — Encounter: Payer: Self-pay | Admitting: Internal Medicine

## 2015-01-02 ENCOUNTER — Ambulatory Visit (INDEPENDENT_AMBULATORY_CARE_PROVIDER_SITE_OTHER): Payer: Medicare Other | Admitting: Internal Medicine

## 2015-01-02 VITALS — BP 141/101 | HR 94 | Temp 97.8°F | Wt 130.0 lb

## 2015-01-02 DIAGNOSIS — Z23 Encounter for immunization: Secondary | ICD-10-CM

## 2015-01-02 DIAGNOSIS — G9389 Other specified disorders of brain: Secondary | ICD-10-CM | POA: Diagnosis present

## 2015-01-02 DIAGNOSIS — Z113 Encounter for screening for infections with a predominantly sexual mode of transmission: Secondary | ICD-10-CM | POA: Diagnosis not present

## 2015-01-02 DIAGNOSIS — Z79899 Other long term (current) drug therapy: Secondary | ICD-10-CM

## 2015-01-02 DIAGNOSIS — G40909 Epilepsy, unspecified, not intractable, without status epilepticus: Secondary | ICD-10-CM

## 2015-01-02 DIAGNOSIS — B20 Human immunodeficiency virus [HIV] disease: Secondary | ICD-10-CM | POA: Diagnosis not present

## 2015-01-02 LAB — COMPLETE METABOLIC PANEL WITH GFR
ALT: 13 U/L (ref 9–46)
AST: 19 U/L (ref 10–40)
Albumin: 4.2 g/dL (ref 3.6–5.1)
Alkaline Phosphatase: 80 U/L (ref 40–115)
BUN: 20 mg/dL (ref 7–25)
CALCIUM: 9.6 mg/dL (ref 8.6–10.3)
CHLORIDE: 105 mmol/L (ref 98–110)
CO2: 22 mmol/L (ref 20–31)
Creat: 1.39 mg/dL — ABNORMAL HIGH (ref 0.60–1.35)
GFR, EST AFRICAN AMERICAN: 73 mL/min (ref >=60)
GFR, EST NON AFRICAN AMERICAN: 63 mL/min (ref >=60)
Glucose, Bld: 88 mg/dL (ref 65–99)
POTASSIUM: 4.2 mmol/L (ref 3.5–5.3)
Sodium: 139 mmol/L (ref 135–146)
TOTAL PROTEIN: 7 g/dL (ref 6.1–8.1)
Total Bilirubin: 0.4 mg/dL (ref 0.2–1.2)

## 2015-01-02 LAB — CBC WITH DIFFERENTIAL/PLATELET
BASOS ABS: 0 10*3/uL (ref 0.0–0.1)
Basophils Relative: 1 % (ref 0–1)
Eosinophils Absolute: 0.1 10*3/uL (ref 0.0–0.7)
Eosinophils Relative: 3 % (ref 0–5)
HEMATOCRIT: 40.6 % (ref 39.0–52.0)
HEMOGLOBIN: 13.9 g/dL (ref 13.0–17.0)
LYMPHS PCT: 40 % (ref 12–46)
Lymphs Abs: 1.6 10*3/uL (ref 0.7–4.0)
MCH: 34.5 pg — ABNORMAL HIGH (ref 26.0–34.0)
MCHC: 34.2 g/dL (ref 30.0–36.0)
MCV: 100.7 fL — ABNORMAL HIGH (ref 78.0–100.0)
MPV: 10.1 fL (ref 8.6–12.4)
Monocytes Absolute: 0.4 10*3/uL (ref 0.1–1.0)
Monocytes Relative: 10 % (ref 3–12)
NEUTROS ABS: 1.8 10*3/uL (ref 1.7–7.7)
NEUTROS PCT: 46 % (ref 43–77)
Platelets: 316 10*3/uL (ref 150–400)
RBC: 4.03 MIL/uL — AB (ref 4.22–5.81)
RDW: 12.7 % (ref 11.5–15.5)
WBC: 3.9 10*3/uL — AB (ref 4.0–10.5)

## 2015-01-02 LAB — LIPID PANEL
CHOLESTEROL: 130 mg/dL (ref 125–200)
HDL: 58 mg/dL (ref >=40)
LDL CALC: 63 mg/dL (ref <130)
TRIGLYCERIDES: 45 mg/dL (ref <150)
Total CHOL/HDL Ratio: 2.2 Ratio (ref <=5.0)
VLDL: 9 mg/dL (ref <30)

## 2015-01-02 NOTE — Progress Notes (Signed)
CC: Follow up for HIV  Interval history: Currently is asymptomatic and well-controlled on Triumeq.  This was changed at his last visit about 1 year ago and then he moved to [place], though did not have HIV folllow up there.  Since last visit he has not had any problems with his medication despite not being here in one year.   Has no associated n/v/d with new medication..  Denies any missed doses.     Also has recently broken his wrist after a fall that he does not remember, potentially due to a seizure and noted on CT scan of his head that he had areas of concern for encephalomalacia, not previously seen.  Does not see neurology here, [facility] and does not go there anymore.  Restarted Keppra.     ED visit reviewed and noted finding on CT and needs an MRI follow up.   Prior to Admission medications   Medication Sig Start Date End Date Taking? Authorizing Provider  levETIRAcetam (KEPPRA) 500 MG tablet Take 500 mg by mouth 2 (two) times daily.   Yes Historical Provider, MD  TRIUMEQ 600-50-300 MG TABS TAKE 1 TABLET BY MOUTH DAILY 11/20/14  Yes Gardiner Barefootobert W Beena Catano, MD    Review of Systems Constitutional: negative for fatigue and malaise Gastrointestinal: negative for diarrhea Neurological: negative for headaches, dizziness and vertigo All other systems reviewed and are negative   Physical Exam: CONSTITUTIONAL:in no apparent distress and alert  Filed Vitals:   01/02/15 1026  BP: 141/101  Pulse: 94  Temp: 97.8 F (36.6 C)   Eyes: anicteric HENT: no thrush, no cervical lymphadenopathy Respiratory: Normal respiratory effort; CTA B MS: left wrist in soft cast  Lab Results  Component Value Date   HIV1RNAQUANT <20 01/09/2014   HIV1RNAQUANT <20 08/08/2013   HIV1RNAQUANT 49* 01/18/2013   No components found for: HIV1GENOTYPRPLUS No components found for: THELPERCELL

## 2015-01-02 NOTE — Assessment & Plan Note (Signed)
I will order a follow up MRI of CT scan.

## 2015-01-02 NOTE — Assessment & Plan Note (Signed)
Will refer to neurology here in [place].

## 2015-01-02 NOTE — Assessment & Plan Note (Signed)
Labs today and rtc 4 months unless concerns.  

## 2015-01-03 LAB — T-HELPER CELL (CD4) - (RCID CLINIC ONLY)
CD4 T CELL HELPER: 29 % — AB (ref 33–55)
CD4 T Cell Abs: 470 /uL (ref 400–2700)

## 2015-01-03 LAB — HIV-1 RNA QUANT-NO REFLEX-BLD

## 2015-01-03 LAB — HCV RNA QUANT RFLX ULTRA OR GENOTYP: HCV QUANT: NOT DETECTED [IU]/mL (ref <15)

## 2015-01-03 LAB — RPR

## 2015-01-04 ENCOUNTER — Ambulatory Visit: Payer: Medicare Other | Admitting: Neurology

## 2015-01-10 ENCOUNTER — Ambulatory Visit: Payer: Medicare Other | Admitting: Family Medicine

## 2015-01-16 ENCOUNTER — Ambulatory Visit (HOSPITAL_COMMUNITY): Admission: RE | Admit: 2015-01-16 | Payer: Medicare Other | Source: Ambulatory Visit

## 2015-01-20 ENCOUNTER — Other Ambulatory Visit: Payer: Self-pay | Admitting: Internal Medicine

## 2015-01-23 ENCOUNTER — Other Ambulatory Visit: Payer: Self-pay | Admitting: *Deleted

## 2015-01-23 DIAGNOSIS — B20 Human immunodeficiency virus [HIV] disease: Secondary | ICD-10-CM

## 2015-01-23 MED ORDER — ABACAVIR-DOLUTEGRAVIR-LAMIVUD 600-50-300 MG PO TABS: 1.0000 | ORAL_TABLET | Freq: Every day | ORAL | Status: DC

## 2015-01-25 ENCOUNTER — Ambulatory Visit: Payer: Medicare Other | Admitting: Family Medicine

## 2015-01-29 ENCOUNTER — Telehealth: Payer: Self-pay | Admitting: Internal Medicine

## 2015-01-29 NOTE — Telephone Encounter (Signed)
Daniel House called me stating that there is some problem with his Triumeq refill. [facility] gave him a few pills to get him through the weekend and he is out now. Our clinic is closed today because of the snow and ice. I called [facility] and he has SPAP. The pharmacist there is going to contact the office in [place] and see what the problem is. He will also try to get him a trial card so he can get a free supply of Triumeq into clinic opens and we can try to resolve this problem.

## 2015-02-06 ENCOUNTER — Telehealth: Payer: Self-pay | Admitting: *Deleted

## 2015-02-06 NOTE — Telephone Encounter (Signed)
Patient called and advised he needs Spap and was told that we would sign him up. Advised we can do that but he needs to come into the office and meet with one of our PA counselors to do paperwork. He advised he did not think he had to do anything but call us and we would do it for him. Advised the patient to come by the office and we can help him apply.

## 2015-03-13 ENCOUNTER — Ambulatory Visit: Payer: Medicare Other | Admitting: Infectious Diseases

## 2015-07-21 ENCOUNTER — Other Ambulatory Visit: Payer: Self-pay | Admitting: Internal Medicine

## 2015-08-21 ENCOUNTER — Other Ambulatory Visit: Payer: Self-pay | Admitting: Internal Medicine

## 2015-08-23 ENCOUNTER — Encounter: Payer: Self-pay | Admitting: Internal Medicine

## 2015-08-23 ENCOUNTER — Ambulatory Visit (INDEPENDENT_AMBULATORY_CARE_PROVIDER_SITE_OTHER): Payer: Medicare Other | Admitting: Internal Medicine

## 2015-08-23 VITALS — BP 133/96 | HR 93 | Temp 98.5°F | Ht 72.0 in | Wt 130.0 lb

## 2015-08-23 DIAGNOSIS — G40909 Epilepsy, unspecified, not intractable, without status epilepticus: Secondary | ICD-10-CM | POA: Diagnosis not present

## 2015-08-23 DIAGNOSIS — B20 Human immunodeficiency virus [HIV] disease: Secondary | ICD-10-CM

## 2015-08-23 NOTE — Assessment & Plan Note (Signed)
He is not interested in neurology.  I am not sure if he needs Keppra or not but he is off of it now.  I encouraged him to see neurology.

## 2015-08-23 NOTE — Progress Notes (Signed)
CC: Follow up for HIV  Interval history: Currently is asymptomatic and well-controlled on Triumeq.    Since last visit he has not had any problems with his medication.   Has no associated n/v/d with new medication..  Denies any missed doses.     Had a possible seizure last year and MRI with encephalomalacia.  I referred him to neurology but he did not go.  Has been off of Keppra for about 1-2 months.   Had been to neurology in [place] but has not returned in 1 year. Does not feel he needs to go back.     Prior to Admission medications   Medication Sig Start Date End Date Taking? Authorizing Provider  levETIRAcetam (KEPPRA) 500 MG tablet Take 500 mg by mouth 2 (two) times daily.   Yes Historical Provider, MD  TRIUMEQ 600-50-300 MG TABS TAKE 1 TABLET BY MOUTH DAILY 11/20/14  Yes Gardiner Barefoot, MD    Review of Systems Constitutional: negative for fatigue and malaise Gastrointestinal: negative for diarrhea Neurological: negative for headaches, dizziness and vertigo All other systems reviewed and are negative   Physical Exam: CONSTITUTIONAL:in no apparent distress and alert  Vitals:   08/23/15 1523  BP: (!) 133/96  Pulse: 93  Temp: 98.5 F (36.9 C)   Eyes: anicteric HENT: no thrush, no cervical lymphadenopathy Respiratory: Normal respiratory effort; CTA B MS: left wrist in soft cast  Lab Results  Component Value Date   HIV1RNAQUANT <20 01/02/2015   HIV1RNAQUANT <20 01/09/2014   HIV1RNAQUANT <20 08/08/2013   No components found for: HIV1GENOTYPRPLUS No components found for: THELPERCELL

## 2015-08-24 LAB — T-HELPER CELL (CD4) - (RCID CLINIC ONLY)
CD4 T CELL ABS: 460 /uL (ref 400–2700)
CD4 T CELL HELPER: 24 % — AB (ref 33–55)

## 2015-08-24 LAB — HIV-1 RNA QUANT-NO REFLEX-BLD: HIV-1 RNA Quant, Log: <1.3 Log copies/mL (ref <1.30)

## 2015-10-13 ENCOUNTER — Other Ambulatory Visit: Payer: Self-pay | Admitting: Internal Medicine

## 2015-10-13 DIAGNOSIS — B20 Human immunodeficiency virus [HIV] disease: Secondary | ICD-10-CM

## 2015-11-22 ENCOUNTER — Ambulatory Visit (INDEPENDENT_AMBULATORY_CARE_PROVIDER_SITE_OTHER): Payer: Medicare Other

## 2015-11-22 DIAGNOSIS — Z23 Encounter for immunization: Secondary | ICD-10-CM | POA: Diagnosis present

## 2015-11-23 ENCOUNTER — Ambulatory Visit: Payer: Medicare Other

## 2016-01-01 DIAGNOSIS — S40011A Contusion of right shoulder, initial encounter: Secondary | ICD-10-CM | POA: Diagnosis not present

## 2016-01-01 DIAGNOSIS — S59901A Unspecified injury of right elbow, initial encounter: Secondary | ICD-10-CM | POA: Diagnosis not present

## 2016-01-01 DIAGNOSIS — S4991XA Unspecified injury of right shoulder and upper arm, initial encounter: Secondary | ICD-10-CM | POA: Diagnosis not present

## 2016-01-01 DIAGNOSIS — S199XXA Unspecified injury of neck, initial encounter: Secondary | ICD-10-CM | POA: Diagnosis not present

## 2016-01-01 DIAGNOSIS — M25521 Pain in right elbow: Secondary | ICD-10-CM | POA: Diagnosis not present

## 2016-01-01 DIAGNOSIS — M79642 Pain in left hand: Secondary | ICD-10-CM | POA: Diagnosis not present

## 2016-01-01 DIAGNOSIS — M25511 Pain in right shoulder: Secondary | ICD-10-CM | POA: Diagnosis not present

## 2016-01-01 DIAGNOSIS — Z21 Asymptomatic human immunodeficiency virus [HIV] infection status: Secondary | ICD-10-CM | POA: Diagnosis not present

## 2016-01-01 DIAGNOSIS — S161XXA Strain of muscle, fascia and tendon at neck level, initial encounter: Secondary | ICD-10-CM | POA: Diagnosis not present

## 2016-01-01 DIAGNOSIS — S6992XA Unspecified injury of left wrist, hand and finger(s), initial encounter: Secondary | ICD-10-CM | POA: Diagnosis not present

## 2016-01-01 DIAGNOSIS — S139XXA Sprain of joints and ligaments of unspecified parts of neck, initial encounter: Secondary | ICD-10-CM | POA: Diagnosis not present

## 2016-01-01 DIAGNOSIS — S63602A Unspecified sprain of left thumb, initial encounter: Secondary | ICD-10-CM | POA: Diagnosis not present

## 2016-04-02 ENCOUNTER — Other Ambulatory Visit: Payer: Self-pay | Admitting: Internal Medicine

## 2016-04-02 DIAGNOSIS — B20 Human immunodeficiency virus [HIV] disease: Secondary | ICD-10-CM

## 2016-04-21 ENCOUNTER — Telehealth: Payer: Self-pay | Admitting: *Deleted

## 2016-04-21 ENCOUNTER — Encounter: Payer: Self-pay | Admitting: Internal Medicine

## 2016-04-21 ENCOUNTER — Ambulatory Visit (INDEPENDENT_AMBULATORY_CARE_PROVIDER_SITE_OTHER): Payer: Medicare Other | Admitting: Internal Medicine

## 2016-04-21 ENCOUNTER — Other Ambulatory Visit (HOSPITAL_COMMUNITY)
Admission: RE | Admit: 2016-04-21 | Discharge: 2016-04-21 | Disposition: A | Discharge status: Home / Self Care | Payer: Medicare Other | Source: Ambulatory Visit | Attending: Internal Medicine | Admitting: Internal Medicine

## 2016-04-21 VITALS — BP 135/95 | HR 90 | Temp 98.6°F | Ht 72.0 in | Wt 137.0 lb

## 2016-04-21 DIAGNOSIS — Z79899 Other long term (current) drug therapy: Secondary | ICD-10-CM | POA: Diagnosis not present

## 2016-04-21 DIAGNOSIS — Z113 Encounter for screening for infections with a predominantly sexual mode of transmission: Secondary | ICD-10-CM | POA: Insufficient documentation

## 2016-04-21 DIAGNOSIS — B20 Human immunodeficiency virus [HIV] disease: Secondary | ICD-10-CM | POA: Diagnosis not present

## 2016-04-21 DIAGNOSIS — R21 Rash and other nonspecific skin eruption: Secondary | ICD-10-CM | POA: Diagnosis not present

## 2016-04-21 DIAGNOSIS — Z23 Encounter for immunization: Secondary | ICD-10-CM | POA: Diagnosis not present

## 2016-04-21 DIAGNOSIS — Z720 Tobacco use: Secondary | ICD-10-CM | POA: Diagnosis not present

## 2016-04-21 LAB — COMPLETE METABOLIC PANEL WITH GFR
ALBUMIN: 4.3 g/dL (ref 3.6–5.1)
ALK PHOS: 82 U/L (ref 40–115)
ALT: 12 U/L (ref 9–46)
AST: 18 U/L (ref 10–40)
BILIRUBIN TOTAL: 0.3 mg/dL (ref 0.2–1.2)
BUN: 17 mg/dL (ref 7–25)
CO2: 27 mmol/L (ref 20–31)
CREATININE: 1.45 mg/dL — AB (ref 0.60–1.35)
Calcium: 9.7 mg/dL (ref 8.6–10.3)
Chloride: 104 mmol/L (ref 98–110)
GFR, EST NON AFRICAN AMERICAN: 59 mL/min — AB (ref >=60)
GFR, Est African American: 69 mL/min (ref >=60)
GLUCOSE: 94 mg/dL (ref 65–99)
Potassium: 4.5 mmol/L (ref 3.5–5.3)
SODIUM: 138 mmol/L (ref 135–146)
TOTAL PROTEIN: 7.5 g/dL (ref 6.1–8.1)

## 2016-04-21 LAB — CBC WITH DIFFERENTIAL/PLATELET
BASOS ABS: 40 {cells}/uL (ref 0–200)
Basophils Relative: 1 %
EOS ABS: 160 {cells}/uL (ref 15–500)
EOS PCT: 4 %
HCT: 40.6 % (ref 38.5–50.0)
Hemoglobin: 13.8 g/dL (ref 13.2–17.1)
LYMPHS PCT: 43 %
Lymphs Abs: 1720 cells/uL (ref 850–3900)
MCH: 34.8 pg — ABNORMAL HIGH (ref 27.0–33.0)
MCHC: 34 g/dL (ref 32.0–36.0)
MCV: 102.3 fL — AB (ref 80.0–100.0)
MONOS PCT: 10 %
MPV: 10.2 fL (ref 7.5–12.5)
Monocytes Absolute: 400 cells/uL (ref 200–950)
NEUTROS ABS: 1680 {cells}/uL (ref 1500–7800)
NEUTROS PCT: 42 %
PLATELETS: 246 10*3/uL (ref 140–400)
RBC: 3.97 MIL/uL — ABNORMAL LOW (ref 4.20–5.80)
RDW: 13.2 % (ref 11.0–15.0)
WBC: 4 10*3/uL (ref 3.8–10.8)

## 2016-04-21 LAB — LIPID PANEL
Cholesterol: 161 mg/dL (ref <200)
HDL: 55 mg/dL (ref >40)
LDL Cholesterol: 94 mg/dL (ref <100)
Total CHOL/HDL Ratio: 2.9 Ratio (ref <5.0)
Triglycerides: 62 mg/dL (ref <150)
VLDL: 12 mg/dL (ref <30)

## 2016-04-21 MED ORDER — FLUCONAZOLE 200 MG PO TABS: 200.0000 mg | ORAL_TABLET | Freq: Every day | ORAL | 0 refills | Status: DC

## 2016-04-21 NOTE — Assessment & Plan Note (Signed)
He is doing well on his regimen and will check his labs today.  rtc 6 months

## 2016-04-21 NOTE — Telephone Encounter (Signed)
Patient notified of appointment with [facility] for 05/06/16 at 10:10 AM. Office note faxed.

## 2016-04-21 NOTE — Progress Notes (Signed)
CC: Follow up for HIV  Interval history: Currently is asymptomatic and well-controlled on Triumeq.  Since last visit he has no new issues related to HIV.  Has no associated n/v/d.  Denies any missed doses.  No labs prior to this visit. Last CD4 460, viral load < 20.  No weight loss.   He also has a rash on his feet bilateral and left lateral ankle.  This came on about 1 month ago and also had a similar rash in 2017.  The rash on the feet is circular, erythematous with a pale base and on the left ankle is hyperpigmented, itchy.  No recent sexual exposures and uses condoms always.  No penile or oral lesions.    Prior to Admission medications   Medication Sig Start Date End Date Taking? Authorizing Provider  TRIUMEQ 600-50-300 MG tablet TAKE 1 TABLET BY MOUTH DAILY 04/02/16  Yes Gardiner Barefoot, MD  fluconazole (DIFLUCAN) 200 MG tablet Take 1 tablet (200 mg total) by mouth daily. 04/21/16   Gardiner Barefoot, MD    Review of Systems Constitutional: negative for fatigue, malaise and anorexia Gastrointestinal: negative for melena Musculoskeletal: negative for myalgias and arthralgias All other systems reviewed and are negative    Physical Exam: CONSTITUTIONAL:in no apparent distress  Vitals:   04/21/16 1000  BP: (!) 135/95  Pulse: 90  Temp: 98.6 F (37 C)   Eyes: anicteric HENT: no thrush, no cervical lymphadenopathy Respiratory: Normal respiratory effort; CTA B Cardiovascular: RRR Skin: feet with about 1 cm circular lesions with a pale base on soles, left ankle with hyperpigmented area with a surrounding palpable border that is scaly.    Lab Results  Component Value Date   HIV1RNAQUANT <20 08/23/2015   HIV1RNAQUANT <20 01/02/2015   HIV1RNAQUANT <20 01/09/2014   No components found for: HIV1GENOTYPRPLUS No components found for: THELPERCELL  SH: continues to smoke

## 2016-04-21 NOTE — Assessment & Plan Note (Addendum)
I am not sure of what this rash is, I will check an RPR but it is not typical for syphilis, I doubt Kaposi's since he is well controlled, may be infectious.  I will start fluconazole and refer to dermatology as welll for ? Diagnosis, ? Biopsy.

## 2016-04-21 NOTE — Assessment & Plan Note (Signed)
Advised to quit.  

## 2016-04-22 ENCOUNTER — Telehealth: Payer: Self-pay | Admitting: *Deleted

## 2016-04-22 ENCOUNTER — Ambulatory Visit (INDEPENDENT_AMBULATORY_CARE_PROVIDER_SITE_OTHER): Payer: Medicare Other | Admitting: *Deleted

## 2016-04-22 DIAGNOSIS — A539 Syphilis, unspecified: Secondary | ICD-10-CM

## 2016-04-22 LAB — URINE CYTOLOGY ANCILLARY ONLY
CHLAMYDIA, DNA PROBE: NEGATIVE
NEISSERIA GONORRHEA: NEGATIVE

## 2016-04-22 LAB — RPR TITER

## 2016-04-22 LAB — RPR: RPR: REACTIVE — AB

## 2016-04-22 LAB — T-HELPER CELL (CD4) - (RCID CLINIC ONLY)
CD4 T CELL HELPER: 27 % — AB (ref 33–55)
CD4 T Cell Abs: 480 /uL (ref 400–2700)

## 2016-04-22 LAB — FLUORESCENT TREPONEMAL AB(FTA)-IGG-BLD: Fluorescent Treponemal ABS: REACTIVE — AB

## 2016-04-22 MED ORDER — PENICILLIN G BENZATHINE 1200000 UNIT/2ML IM SUSP
1.2000 10*6.[IU] | Freq: Once | INTRAMUSCULAR | Status: AC
  Administered 2016-04-22: 1.2 10*6.[IU] via INTRAMUSCULAR

## 2016-04-22 NOTE — Telephone Encounter (Signed)
Left patient a voice mail to call the clinic for lab results. If his rash on lower leg improves after treatment, his appointment with dermatology can be cancelled for 05/06/16. Daniel House

## 2016-04-22 NOTE — Telephone Encounter (Signed)
Patient called back and he will come in today for treatment. He has been added to the nurse schedule. Wendall Mola CMA

## 2016-04-22 NOTE — Telephone Encounter (Signed)
-----   Message from Gardiner Barefoot, MD sent at 04/22/2016  8:47 AM EDT ----- He is positive for syphilis - needs 1 Bicillin injection. thanks

## 2016-04-22 NOTE — Patient Instructions (Signed)
Patient given demonstration of the use of dental dams for oral/anal sex to prevent future STIs and HPV transmission.  Patient given lubricant packets and dental dams.  Patient advised to keep moving buttocks muscles today and to sit in a tub of hot water this evening to improve absorption of the medication.  Patient verbalized understanding.

## 2016-04-23 LAB — HIV-1 RNA QUANT-NO REFLEX-BLD
HIV 1 RNA Quant: 42 copies/mL — ABNORMAL HIGH
HIV-1 RNA QUANT, LOG: 1.62 {Log_copies}/mL — AB

## 2016-08-13 DIAGNOSIS — L308 Other specified dermatitis: Secondary | ICD-10-CM | POA: Diagnosis not present

## 2016-09-03 ENCOUNTER — Other Ambulatory Visit: Payer: Self-pay | Admitting: Internal Medicine

## 2016-09-03 DIAGNOSIS — B20 Human immunodeficiency virus [HIV] disease: Secondary | ICD-10-CM

## 2016-11-05 ENCOUNTER — Ambulatory Visit (INDEPENDENT_AMBULATORY_CARE_PROVIDER_SITE_OTHER): Payer: Medicare Other | Admitting: Internal Medicine

## 2016-11-05 ENCOUNTER — Encounter: Payer: Self-pay | Admitting: Internal Medicine

## 2016-11-05 VITALS — BP 115/80 | HR 99 | Temp 98.0°F | Wt 133.0 lb

## 2016-11-05 DIAGNOSIS — Z720 Tobacco use: Secondary | ICD-10-CM | POA: Diagnosis not present

## 2016-11-05 DIAGNOSIS — A539 Syphilis, unspecified: Secondary | ICD-10-CM | POA: Diagnosis not present

## 2016-11-05 DIAGNOSIS — Z23 Encounter for immunization: Secondary | ICD-10-CM | POA: Diagnosis not present

## 2016-11-05 DIAGNOSIS — Z113 Encounter for screening for infections with a predominantly sexual mode of transmission: Secondary | ICD-10-CM

## 2016-11-05 DIAGNOSIS — B20 Human immunodeficiency virus [HIV] disease: Secondary | ICD-10-CM | POA: Diagnosis not present

## 2016-11-05 NOTE — Assessment & Plan Note (Signed)
Doing well.  Labs today and rtc 6 months.  

## 2016-11-05 NOTE — Assessment & Plan Note (Signed)
I will recheck rpr today

## 2016-11-05 NOTE — Assessment & Plan Note (Signed)
Trying to quit.  Encouraged.

## 2016-11-05 NOTE — Progress Notes (Signed)
   Subjective:    Patient ID: Zebedee IbaRon Weldin, male    DOB: 05-29-1975, 41 y.o.   MRN: [id number]  HPI Here for follow up of HIV. On triumeq and denies any missed doses.  Had syphilis last visit and got 1 dose of penicillin.  No missed doses and no associated n/v/d.  No rashes. Saw dermatology and no current issues.      Review of Systems  Constitutional: Negative for fatigue.  Gastrointestinal: Negative for diarrhea.  Skin: Negative for rash.       Objective:   Physical Exam  Constitutional: He appears well-developed and well-nourished. No distress.  Eyes: No scleral icterus.  Cardiovascular: Normal rate, regular rhythm and normal heart sounds.   No murmur heard. Pulmonary/Chest: Effort normal and breath sounds normal. No respiratory distress.  Lymphadenopathy:    He has no cervical adenopathy.  Skin: No rash noted.   SH: + tobacco       Assessment & Plan:

## 2016-11-07 LAB — T-HELPER CELL (CD4) - (RCID CLINIC ONLY)
CD4 % Helper T Cell: 26 % — ABNORMAL LOW (ref 33–55)
CD4 T CELL ABS: 410 /uL (ref 400–2700)

## 2016-11-07 LAB — HIV-1 RNA QUANT-NO REFLEX-BLD
HIV 1 RNA Quant: <20 copies/mL
HIV-1 RNA QUANT, LOG: NOT DETECTED {Log_copies}/mL

## 2016-11-11 ENCOUNTER — Other Ambulatory Visit: Payer: Medicare Other

## 2017-01-17 ENCOUNTER — Other Ambulatory Visit: Payer: Self-pay | Admitting: Internal Medicine

## 2017-01-17 DIAGNOSIS — B20 Human immunodeficiency virus [HIV] disease: Secondary | ICD-10-CM

## 2017-02-05 ENCOUNTER — Encounter (HOSPITAL_COMMUNITY): Payer: Self-pay | Admitting: Emergency Medicine

## 2017-02-05 ENCOUNTER — Emergency Department (HOSPITAL_COMMUNITY)
Admission: EM | Admit: 2017-02-05 | Discharge: 2017-02-05 | Disposition: A | Discharge status: Home / Self Care | Payer: Medicare Other | Attending: Emergency Medicine | Admitting: Emergency Medicine

## 2017-02-05 ENCOUNTER — Other Ambulatory Visit: Payer: Self-pay

## 2017-02-05 DIAGNOSIS — M25511 Pain in right shoulder: Secondary | ICD-10-CM | POA: Insufficient documentation

## 2017-02-05 DIAGNOSIS — R369 Urethral discharge, unspecified: Secondary | ICD-10-CM | POA: Diagnosis not present

## 2017-02-05 DIAGNOSIS — N189 Chronic kidney disease, unspecified: Secondary | ICD-10-CM | POA: Insufficient documentation

## 2017-02-05 DIAGNOSIS — G8929 Other chronic pain: Secondary | ICD-10-CM

## 2017-02-05 DIAGNOSIS — N390 Urinary tract infection, site not specified: Secondary | ICD-10-CM | POA: Diagnosis not present

## 2017-02-05 DIAGNOSIS — F17210 Nicotine dependence, cigarettes, uncomplicated: Secondary | ICD-10-CM | POA: Insufficient documentation

## 2017-02-05 DIAGNOSIS — Z79899 Other long term (current) drug therapy: Secondary | ICD-10-CM | POA: Diagnosis not present

## 2017-02-05 LAB — URINALYSIS, MICROSCOPIC (REFLEX)

## 2017-02-05 LAB — CBC
HCT: 42.5 % (ref 39.0–52.0)
Hemoglobin: 14.3 g/dL (ref 13.0–17.0)
MCH: 35 pg — ABNORMAL HIGH (ref 26.0–34.0)
MCHC: 33.6 g/dL (ref 30.0–36.0)
MCV: 104.2 fL — ABNORMAL HIGH (ref 78.0–100.0)
PLATELETS: 208 10*3/uL (ref 150–400)
RBC: 4.08 MIL/uL — ABNORMAL LOW (ref 4.22–5.81)
RDW: 13.1 % (ref 11.5–15.5)
WBC: 6.8 10*3/uL (ref 4.0–10.5)

## 2017-02-05 LAB — COMPREHENSIVE METABOLIC PANEL
ALT: 11 U/L — AB (ref 17–63)
AST: 19 U/L (ref 15–41)
Albumin: 4.1 g/dL (ref 3.5–5.0)
Alkaline Phosphatase: 95 U/L (ref 38–126)
Anion gap: 12 (ref 5–15)
BILIRUBIN TOTAL: 0.4 mg/dL (ref 0.3–1.2)
BUN: 18 mg/dL (ref 6–20)
CO2: 23 mmol/L (ref 22–32)
CREATININE: 1.41 mg/dL — AB (ref 0.61–1.24)
Calcium: 9.4 mg/dL (ref 8.9–10.3)
Chloride: 104 mmol/L (ref 101–111)
GFR calc Af Amer: >60 mL/min (ref >60)
Glucose, Bld: 124 mg/dL — ABNORMAL HIGH (ref 65–99)
POTASSIUM: 3.8 mmol/L (ref 3.5–5.1)
Sodium: 139 mmol/L (ref 135–145)
TOTAL PROTEIN: 7.5 g/dL (ref 6.5–8.1)

## 2017-02-05 LAB — URINALYSIS, ROUTINE W REFLEX MICROSCOPIC
Glucose, UA: NEGATIVE mg/dL
KETONES UR: 15 mg/dL — AB
NITRITE: NEGATIVE
PROTEIN: 100 mg/dL — AB
Specific Gravity, Urine: >1.03 — ABNORMAL HIGH (ref 1.005–1.030)
pH: 5 (ref 5.0–8.0)

## 2017-02-05 MED ORDER — DOXYCYCLINE HYCLATE 100 MG PO CAPS: 100.0000 mg | ORAL_CAPSULE | Freq: Two times a day (BID) | ORAL | 0 refills | Status: DC

## 2017-02-05 MED ORDER — CEFTRIAXONE SODIUM 250 MG IJ SOLR
250.0000 mg | Freq: Once | INTRAMUSCULAR | Status: AC
  Administered 2017-02-05: 250 mg via INTRAMUSCULAR
  Filled 2017-02-05: qty 250

## 2017-02-05 MED ORDER — AZITHROMYCIN 250 MG PO TABS
1000.0000 mg | ORAL_TABLET | Freq: Once | ORAL | Status: AC
  Administered 2017-02-05: 1000 mg via ORAL
  Filled 2017-02-05: qty 4

## 2017-02-05 MED ORDER — LIDOCAINE HCL (PF) 1 % IJ SOLN
INTRAMUSCULAR | Status: AC
  Administered 2017-02-05: 1 mL via INTRAMUSCULAR
  Filled 2017-02-05: qty 5

## 2017-02-05 NOTE — Discharge Instructions (Signed)
You have been diagnosed with having a urinary tract infection.  Please take antibiotic as prescribed.  Follow-up with orthopedist for further evaluation of your recurrent right shoulder pain.
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
## 2017-02-05 NOTE — ED Triage Notes (Signed)
Patient here with right shoulder pain and pain with urination.  Patient states that the pain that is in his shoulder is radiating down and comes and goes, now getting worse.  He also states he is having pain with urination and has a white discharge from his penis.

## 2017-02-06 LAB — URINE CULTURE: CULTURE: NO GROWTH

## 2017-02-06 LAB — GC/CHLAMYDIA PROBE AMP (~~LOC~~) NOT AT ARMC
Chlamydia: NEGATIVE
NEISSERIA GONORRHEA: POSITIVE — AB

## 2017-02-07 LAB — RPR, QUANT+TP ABS (REFLEX): T Pallidum Abs: POSITIVE — AB

## 2017-02-07 LAB — RPR: RPR Ser Ql: REACTIVE — AB

## 2017-02-09 ENCOUNTER — Emergency Department (HOSPITAL_COMMUNITY)
Admission: EM | Admit: 2017-02-09 | Discharge: 2017-02-09 | Disposition: A | Discharge status: Home / Self Care | Payer: Medicare Other | Attending: Emergency Medicine | Admitting: Emergency Medicine

## 2017-02-09 ENCOUNTER — Encounter (HOSPITAL_COMMUNITY): Payer: Self-pay | Admitting: *Deleted

## 2017-02-09 ENCOUNTER — Telehealth: Payer: Self-pay | Admitting: *Deleted

## 2017-02-09 ENCOUNTER — Other Ambulatory Visit: Payer: Self-pay

## 2017-02-09 DIAGNOSIS — A539 Syphilis, unspecified: Secondary | ICD-10-CM | POA: Insufficient documentation

## 2017-02-09 DIAGNOSIS — Z79899 Other long term (current) drug therapy: Secondary | ICD-10-CM | POA: Insufficient documentation

## 2017-02-09 DIAGNOSIS — F17210 Nicotine dependence, cigarettes, uncomplicated: Secondary | ICD-10-CM | POA: Insufficient documentation

## 2017-02-09 DIAGNOSIS — M791 Myalgia, unspecified site: Secondary | ICD-10-CM | POA: Diagnosis not present

## 2017-02-09 MED ORDER — CYCLOBENZAPRINE HCL 10 MG PO TABS: 10.0000 mg | ORAL_TABLET | Freq: Two times a day (BID) | ORAL | 0 refills | Status: DC | PRN

## 2017-02-09 MED ORDER — PENICILLIN G BENZATHINE 1200000 UNIT/2ML IM SUSP
2.4000 10*6.[IU] | Freq: Once | INTRAMUSCULAR | Status: AC
  Administered 2017-02-09: 2.4 10*6.[IU] via INTRAMUSCULAR
  Filled 2017-02-09: qty 4

## 2017-02-09 NOTE — Telephone Encounter (Signed)
Patient called, stating he was told he has syphilis and he needs to follow up with [facility]. Per chart, patient was treated in the ED for presumed gonorrhea, given doxycycline 100 mg twice daily for 7 days. RN [facility]. [phone], let them know the prescriptions he was given. Patient verbalized understanding. Andree CossHowell, Michelle M, RN

## 2017-02-09 NOTE — ED Notes (Signed)
Pt states he understands instrutions  Home  Stable with steady gait.

## 2017-02-09 NOTE — ED Triage Notes (Signed)
Pt in here for tx for syphilis, pt received call today that he tested positive, pt seen here for dysuria last week

## 2017-02-09 NOTE — ED Provider Notes (Signed)
[facility] EMERGENCY DEPARTMENT Provider Note   CSN: [id number] Arrival date & time: 02/09/17  1113     History   Chief Complaint Chief Complaint  Patient presents with  . SEXUALLY TRANSMITTED DISEASE   HPI Daniel House is a 42 y.o. male who presents for treatment for syphilis.  Past medical history significant for HIV, hep C, history of syphilis, history of gonorrhea.  Patient was evaluated in the emergency department on January 17 for penile discharge.  He tested positive for gonorrhea and was treated empirically with azithromycin and Rocephin.  He was called this morning and advised to go to the health department for treatment for syphilis.  He states the health department was closed today so he came to the emergency department instead.  He states that his dysuria is overall improved.  He did test positive for syphilis in April 2018 and was treated one time with IM penicillin.  He was supposed to have a test of cure in October but never had this done.  Dr. Luciana Axe is his infectious disease doctor and he has been adherent to his medicines.  He states his viral loads are undetectable.     Past Medical History:  Diagnosis Date  . Chronic kidney disease   . Hematochezia 05/31/2011  . HIV infection (HCC) 04/2011  . PCP (pneumocystis jiroveci pneumonia) (HCC) 05/12/2011  . Pneumonia 04/2011   PCP pneumonia     Patient Active Problem List   Diagnosis Date Noted  . Rash and nonspecific skin eruption 04/21/2016  . Tobacco abuse 04/21/2016  . Encephalomalacia on imaging study 01/02/2015  . Seizure disorder (HCC) 01/02/2015  . Screening examination for venereal disease 09/22/2013  . Encounter for long-term (current) use of medications 09/22/2013  . Brachial plexopathy 02/01/2013  . Plantar fasciitis 09/18/2011  . Medial orbital wall fracture (HCC) 05/31/2011  . Retinopathy of both eyes 05/31/2011  . Cachexia associated with AIDS (HCC) 05/31/2011  . Hepatitis C  05/13/2011  . Syphilis in male 05/12/2011  . HIV disease (HCC) 04/30/2011    Class: Acute    Past Surgical History:  Procedure Laterality Date  . APPENDECTOMY  1996  . ORIF WRIST FRACTURE Left 12/23/2014   Procedure: OPEN REDUCTION INTERNAL FIXATION (ORIF) WRIST FRACTURE;  Surgeon: Knute Neu, MD;  Location: [facility] OR;  Service: Plastics;  Laterality: Left;       Home Medications    Prior to Admission medications   Medication Sig Start Date End Date Taking? Authorizing Provider  TRIUMEQ 600-50-300 MG tablet TAKE 1 TABLET BY MOUTH DAILY 01/19/17  Yes Comer, Belia Heman, MD  cyclobenzaprine (FLEXERIL) 10 MG tablet Take 1 tablet (10 mg total) by mouth 2 (two) times daily as needed for muscle spasms. 02/09/17   Bethel Born, PA-C    Family History No family history on file.  Social History Social History   Tobacco Use  . Smoking status: Current Every Day Smoker    Packs/day: 0.30    Years: 20.00    Pack years: 6.00    Types: Cigarettes  . Smokeless tobacco: Never Used  . Tobacco comment: trying to quit  Substance Use Topics  . Alcohol use: No    Alcohol/week: 0.0 oz  . Drug use: No    Comment: no drugs x 1 year     Allergies   Bactrim [sulfamethoxazole-trimethoprim] and Truvada [emtricitabine-tenofovir df]   Review of Systems Review of Systems  Constitutional: Negative for fever.  Genitourinary: Negative for dysuria.  Musculoskeletal:  Positive for myalgias.     Physical Exam Updated Vital Signs BP (!) 132/98   Pulse 80   Temp 98.6 F (37 C) (Oral)   Resp 16   Ht 6' (1.829 m)   Wt 61.2 kg (135 lb)   SpO2 100%   BMI 18.31 kg/m   Physical Exam  Constitutional: He is oriented to person, place, and time. He appears well-developed and well-nourished. No distress.  Thin.  Calm, cooperative  HENT:  Head: Normocephalic and atraumatic.  Eyes: Conjunctivae are normal. Pupils are equal, round, and reactive to light. Right eye exhibits no discharge. Left eye  exhibits no discharge. No scleral icterus.  Neck: Normal range of motion.  Cardiovascular: Normal rate.  Pulmonary/Chest: Effort normal. No respiratory distress.  Abdominal: He exhibits no distension.  Neurological: He is alert and oriented to person, place, and time.  Skin: Skin is warm and dry.  Psychiatric: He has a normal mood and affect. His behavior is normal.  Nursing note and vitals reviewed.    ED Treatments / Results  Labs (all labs ordered are listed, but only abnormal results are displayed) Labs Reviewed - No data to display  EKG  EKG Interpretation None       Radiology No results found.  Procedures Procedures (including critical care time)  Medications Ordered in ED Medications  penicillin g benzathine (BICILLIN LA) 1200000 UNIT/2ML injection 2.4 Million Units (2.4 Million Units Intramuscular Given 02/09/17 1442)     Initial Impression / Assessment and Plan / ED Course  I have reviewed the triage vital signs and the nursing notes.  Pertinent labs & imaging results that were available during my care of the patient were reviewed by me and considered in my medical decision making (see chart for details).  42 year old male presents for treatment for syphilis.  Unclear if this is a latent infection versus reinfection.  It appears he was supposed to have 3 doses of penicillin in April and he states he only received it once.  He also did not have a test of cure in October so is unclear if this is the same infection or not.  Discussed with Dr. Madilyn Hookees.  Will treat for syphilis of unknown duration with 2.4 mg of penicillin IM today and advised he needs to follow-up with the health department for another dose next week and the week after.  He was also advised to follow-up with Dr. Joaquim Laioomer.  He also is still complaining of right arm/shoulder pain.  He states that he has taken Flexeril in the past which has helped his left arm for similar symptoms.  A prescription for Flexeril  was given.  Final Clinical Impressions(s) / ED Diagnoses   Final diagnoses:  Syphilis    ED Discharge Orders        Ordered    cyclobenzaprine (FLEXERIL) 10 MG tablet  2 times daily PRN     02/09/17 1445       Bethel BornGekas, Kelly Marie, PA-C 02/09/17 1517    Tilden Fossaees, Elizabeth, MD 02/09/17 2042

## 2017-02-09 NOTE — Telephone Encounter (Signed)
Patient was sent home from ED with prescription for 100 mg doxycycline twice daily x 7 days. He was calling the health department for follow up if needed regarding ED syphilis test. Andree CossHowell, Tanina Barb M, RN

## 2017-02-09 NOTE — Discharge Instructions (Addendum)
Please follow up with the health department for additional penicillin injections Please follow up with Dr. Luciana Axeomer  Take Flexeril for muscle pain or spasms as needed

## 2017-02-09 NOTE — Telephone Encounter (Signed)
-----   Message from Gardiner Barefootobert W Comer, MD sent at 02/09/2017  9:27 AM EST ----- He is positive for syphilis and needs one dose of Bicillin 2.4 million units. [facility]  thanks

## 2017-02-18 ENCOUNTER — Other Ambulatory Visit: Payer: Self-pay | Admitting: Internal Medicine

## 2017-02-18 DIAGNOSIS — B20 Human immunodeficiency virus [HIV] disease: Secondary | ICD-10-CM

## 2017-02-26 ENCOUNTER — Ambulatory Visit: Payer: Medicare Other

## 2017-05-20 ENCOUNTER — Ambulatory Visit (INDEPENDENT_AMBULATORY_CARE_PROVIDER_SITE_OTHER): Payer: Medicare Other | Admitting: Internal Medicine

## 2017-05-20 ENCOUNTER — Encounter: Payer: Self-pay | Admitting: Internal Medicine

## 2017-05-20 ENCOUNTER — Other Ambulatory Visit (HOSPITAL_COMMUNITY)
Admission: RE | Admit: 2017-05-20 | Discharge: 2017-05-20 | Disposition: A | Discharge status: Home / Self Care | Payer: Medicare Other | Source: Ambulatory Visit | Attending: Internal Medicine | Admitting: Internal Medicine

## 2017-05-20 VITALS — BP 130/87 | HR 91 | Temp 97.6°F | Ht 72.0 in | Wt 137.4 lb

## 2017-05-20 DIAGNOSIS — R21 Rash and other nonspecific skin eruption: Secondary | ICD-10-CM

## 2017-05-20 DIAGNOSIS — Z720 Tobacco use: Secondary | ICD-10-CM | POA: Diagnosis not present

## 2017-05-20 DIAGNOSIS — B20 Human immunodeficiency virus [HIV] disease: Secondary | ICD-10-CM | POA: Insufficient documentation

## 2017-05-20 DIAGNOSIS — Z113 Encounter for screening for infections with a predominantly sexual mode of transmission: Secondary | ICD-10-CM

## 2017-05-20 LAB — EXTRA LAV TOP TUBE

## 2017-05-20 MED ORDER — TRIAMCINOLONE ACETONIDE 0.025 % EX OINT: 1.0000 "application " | TOPICAL_OINTMENT | Freq: Two times a day (BID) | CUTANEOUS | 2 refills | Status: DC

## 2017-05-21 LAB — BASIC METABOLIC PANEL
BUN / CREAT RATIO: 14 (calc) (ref 6–22)
BUN: 19 mg/dL (ref 7–25)
CHLORIDE: 104 mmol/L (ref 98–110)
CO2: 28 mmol/L (ref 20–32)
Calcium: 10 mg/dL (ref 8.6–10.3)
Creat: 1.4 mg/dL — ABNORMAL HIGH (ref 0.60–1.35)
Glucose, Bld: 94 mg/dL (ref 65–99)
Potassium: 4.5 mmol/L (ref 3.5–5.3)
SODIUM: 140 mmol/L (ref 135–146)

## 2017-05-21 LAB — RPR: RPR Ser Ql: REACTIVE — AB

## 2017-05-21 LAB — URINE CYTOLOGY ANCILLARY ONLY
Chlamydia: NEGATIVE
Neisseria Gonorrhea: NEGATIVE

## 2017-05-21 LAB — FLUORESCENT TREPONEMAL AB(FTA)-IGG-BLD: Fluorescent Treponemal ABS: REACTIVE — AB

## 2017-05-21 LAB — RPR TITER: RPR Titer: 1:8 {titer} — ABNORMAL HIGH

## 2017-05-22 LAB — HIV-1 RNA QUANT-NO REFLEX-BLD
HIV 1 RNA QUANT: 20 {copies}/mL — AB
HIV-1 RNA QUANT, LOG: 1.3 {Log_copies}/mL — AB

## 2017-05-22 LAB — T-HELPER CELL (CD4) - (RCID CLINIC ONLY)
CD4 % Helper T Cell: 26 % — ABNORMAL LOW (ref 33–55)
CD4 T Cell Abs: 460 /uL (ref 400–2700)

## 2017-05-22 NOTE — Assessment & Plan Note (Signed)
counsled on quitting

## 2017-05-22 NOTE — Progress Notes (Signed)
   Subjective:    Patient ID: Daniel House, male    DOB: [birth year], 42 y.o.   MRN: [id number]  HPI Here for follow up of HIV Taking Triumeq and no missed doses.  Feels well.  No associated n/v/d.  Still with a rash on his leg.  Had gone to dermatology and given a steroid cream.  Had a biopsy at the time and tells me it was c/w eczema.  Otherwise no new issues.   Review of Systems  Constitutional: Negative for fatigue.  Gastrointestinal: Negative for diarrhea.  Genitourinary: Negative for discharge.  Skin: Positive for rash.       Objective:   Physical Exam  Constitutional: He appears well-developed and well-nourished. No distress.  HENT:  Mouth/Throat: No oropharyngeal exudate.  Cardiovascular: Normal rate, regular rhythm and normal heart sounds.  Pulmonary/Chest: Effort normal and breath sounds normal. No respiratory distress.  Lymphadenopathy:    He has no cervical adenopathy.   SH: + tobacco       Assessment & Plan:

## 2017-05-22 NOTE — Assessment & Plan Note (Signed)
Doing well.  Labs today and rtc 6 months.  

## 2017-05-22 NOTE — Assessment & Plan Note (Signed)
Will screen today.  Denies unprotected encounters.  Recently treated for syphilis.

## 2017-05-22 NOTE — Assessment & Plan Note (Signed)
Will refill his steroid cream.  I encouraged him to go back to the dermatologist.

## 2017-06-17 ENCOUNTER — Other Ambulatory Visit: Payer: Self-pay | Admitting: Internal Medicine

## 2017-06-17 DIAGNOSIS — B20 Human immunodeficiency virus [HIV] disease: Secondary | ICD-10-CM

## 2017-10-30 DIAGNOSIS — Z23 Encounter for immunization: Secondary | ICD-10-CM | POA: Diagnosis not present

## 2017-10-31 ENCOUNTER — Other Ambulatory Visit: Payer: Self-pay

## 2017-10-31 ENCOUNTER — Emergency Department (HOSPITAL_COMMUNITY)
Admission: EM | Admit: 2017-10-31 | Discharge: 2017-10-31 | Disposition: A | Discharge status: Home / Self Care | Payer: Medicare Other | Attending: Emergency Medicine | Admitting: Emergency Medicine

## 2017-10-31 ENCOUNTER — Emergency Department (HOSPITAL_COMMUNITY): Payer: Medicare Other

## 2017-10-31 ENCOUNTER — Encounter (HOSPITAL_COMMUNITY): Payer: Self-pay | Admitting: Emergency Medicine

## 2017-10-31 DIAGNOSIS — Z7982 Long term (current) use of aspirin: Secondary | ICD-10-CM | POA: Insufficient documentation

## 2017-10-31 DIAGNOSIS — N189 Chronic kidney disease, unspecified: Secondary | ICD-10-CM | POA: Insufficient documentation

## 2017-10-31 DIAGNOSIS — F17210 Nicotine dependence, cigarettes, uncomplicated: Secondary | ICD-10-CM | POA: Insufficient documentation

## 2017-10-31 DIAGNOSIS — Z79899 Other long term (current) drug therapy: Secondary | ICD-10-CM | POA: Insufficient documentation

## 2017-10-31 DIAGNOSIS — J4521 Mild intermittent asthma with (acute) exacerbation: Secondary | ICD-10-CM | POA: Insufficient documentation

## 2017-10-31 DIAGNOSIS — R0602 Shortness of breath: Secondary | ICD-10-CM | POA: Diagnosis not present

## 2017-10-31 MED ORDER — AEROCHAMBER PLUS FLO-VU LARGE MISC
1.0000 | Freq: Once | Status: AC
  Administered 2017-10-31: 1

## 2017-10-31 MED ORDER — PREDNISONE 10 MG PO TABS: 40.0000 mg | ORAL_TABLET | Freq: Every day | ORAL | 0 refills | Status: AC

## 2017-10-31 MED ORDER — IPRATROPIUM-ALBUTEROL 0.5-2.5 (3) MG/3ML IN SOLN
3.0000 mL | Freq: Once | RESPIRATORY_TRACT | Status: AC
  Administered 2017-10-31: 3 mL via RESPIRATORY_TRACT
  Filled 2017-10-31: qty 3

## 2017-10-31 MED ORDER — ALBUTEROL SULFATE HFA 108 (90 BASE) MCG/ACT IN AERS
1.0000 | INHALATION_SPRAY | RESPIRATORY_TRACT | Status: DC | PRN
  Administered 2017-10-31: 1 via RESPIRATORY_TRACT
  Filled 2017-10-31: qty 6.7

## 2017-10-31 MED ORDER — ACETAMINOPHEN 500 MG PO TABS
500.0000 mg | ORAL_TABLET | Freq: Once | ORAL | Status: AC
  Administered 2017-10-31: 500 mg via ORAL
  Filled 2017-10-31: qty 1

## 2017-10-31 NOTE — ED Triage Notes (Signed)
Pt. Stated, Daniel House been SOB for 3 days . I did have asthma, I have had a cold that might have triggered it.

## 2017-10-31 NOTE — ED Provider Notes (Signed)
[facility] EMERGENCY DEPARTMENT Provider Note   CSN: [id number] Arrival date & time: 10/31/17  1018     History   Chief Complaint Chief Complaint  Patient presents with  . Shortness of Breath    HPI Daniel House is a 42 y.o. male with history of HIV presenting today for 3 days of wheezing.  Patient states that he has had asthma in the past but currently does not take medications for this he has not had problems in years.  Patient states that 3 days ago he slept with the window open and when he woke up he was having wheezing that has been constant since that time.  Patient states that he has not taken any medications since this problem began 3 days ago.  Patient states that his wheezing gets worse when he is active and better at rest.  Patient denies fever, rhinorrhea, congestion, chest pain, abdominal pain, nausea/vomiting, rash, headache or visual changes, dizziness or cough.  Patient states that he takes his HIV medication every day and has not missed doses, states that his last checkup was last month and was informed that his CD4 count was normal.  Unable to find these records.  HPI  Past Medical History:  Diagnosis Date  . Chronic kidney disease   . Hematochezia 05/31/2011  . HIV infection (HCC) 04/2011  . PCP (pneumocystis jiroveci pneumonia) (HCC) 05/12/2011  . Pneumonia 04/2011   PCP pneumonia     Patient Active Problem List   Diagnosis Date Noted  . Rash and nonspecific skin eruption 04/21/2016  . Tobacco abuse 04/21/2016  . Encephalomalacia on imaging study 01/02/2015  . Seizure disorder (HCC) 01/02/2015  . Screening examination for venereal disease 09/22/2013  . Encounter for long-term (current) use of medications 09/22/2013  . Brachial plexopathy 02/01/2013  . Plantar fasciitis 09/18/2011  . Medial orbital wall fracture 05/31/2011  . Retinopathy of both eyes 05/31/2011  . Cachexia associated with AIDS (HCC) 05/31/2011  . Hepatitis C 05/13/2011   . Syphilis in male 05/12/2011  . HIV disease (HCC) 04/30/2011    Class: Acute    Past Surgical History:  Procedure Laterality Date  . APPENDECTOMY  1996  . ORIF WRIST FRACTURE Left 12/23/2014   Procedure: OPEN REDUCTION INTERNAL FIXATION (ORIF) WRIST FRACTURE;  Surgeon: Knute Neu, MD;  Location: [facility] OR;  Service: Plastics;  Laterality: Left;        Home Medications    Prior to Admission medications   Medication Sig Start Date End Date Taking? Authorizing Provider  Aspirin-Salicylamide-Caffeine (BC HEADACHE POWDER PO) Take 1 packet by mouth as needed (headache).   Yes [provider]  triamcinolone (KENALOG) 0.025 % ointment Apply 1 application topically 2 (two) times daily. 05/20/17  Yes Comer, Belia Heman, MD  TRIUMEQ 600-50-300 MG tablet TAKE 1 TABLET BY MOUTH DAILY Patient taking differently: Take 1 tablet by mouth daily.  06/17/17  Yes Comer, Belia Heman, MD  cyclobenzaprine (FLEXERIL) 10 MG tablet Take 1 tablet (10 mg total) by mouth 2 (two) times daily as needed for muscle spasms. Patient not taking: Reported on 10/31/2017 02/09/17   Bethel Born, PA-C  predniSONE (DELTASONE) 10 MG tablet Take 4 tablets (40 mg total) by mouth daily for 5 days. 10/31/17 11/05/17  Bill Salinas, PA-C    Family History No family history on file.  Social History Social History   Tobacco Use  . Smoking status: Current Every Day Smoker    Packs/day: 0.30    Years:  20.00    Pack years: 6.00    Types: Cigarettes  . Smokeless tobacco: Never Used  . Tobacco comment: trying to quit  Substance Use Topics  . Alcohol use: No    Alcohol/week: 0.0 standard drinks  . Drug use: No    Comment: no drugs x 1 year     Allergies   Bactrim [sulfamethoxazole-trimethoprim] and Truvada [emtricitabine-tenofovir df]   Review of Systems Review of Systems  Constitutional: Negative.  Negative for chills and fever.  HENT: Negative.  Negative for rhinorrhea and sore throat.   Eyes:  Negative.  Negative for visual disturbance.  Respiratory: Positive for shortness of breath and wheezing.   Cardiovascular: Negative.  Negative for chest pain.  Gastrointestinal: Negative.  Negative for abdominal pain, diarrhea, nausea and vomiting.  Genitourinary: Negative.  Negative for dysuria and hematuria.  Musculoskeletal: Negative.  Negative for arthralgias and myalgias.  Skin: Negative.  Negative for rash.  Neurological: Negative.  Negative for dizziness, syncope, weakness, light-headedness and headaches.   Physical Exam Updated Vital Signs BP 122/89   Pulse 85   Temp 98.3 F (36.8 C) (Oral)   Resp 20   Ht 6' (1.829 m)   Wt 65.8 kg   SpO2 95%   BMI 19.67 kg/m   Physical Exam  Constitutional: He appears well-developed and well-nourished. No distress.  HENT:  Head: Normocephalic and atraumatic.  Right Ear: External ear normal.  Left Ear: External ear normal.  Nose: Nose normal.  Mouth/Throat: Uvula is midline and oropharynx is clear and moist.  Eyes: Pupils are equal, round, and reactive to light. EOM are normal.  Neck: Trachea normal, normal range of motion, full passive range of motion without pain and phonation normal. Neck supple. No tracheal deviation present.  Cardiovascular: Normal rate, regular rhythm, normal heart sounds and intact distal pulses.  Pulses:      Dorsalis pedis pulses are 2+ on the right side, and 2+ on the left side.       Posterior tibial pulses are 2+ on the right side, and 2+ on the left side.  Pulmonary/Chest: Effort normal. No accessory muscle usage. No respiratory distress. He has wheezes. He exhibits no tenderness, no crepitus and no deformity.  Mild diffuse expiratory wheezes in all fields.  Abdominal: Soft. Bowel sounds are normal. There is no tenderness. There is no rebound and no guarding.  Musculoskeletal: Normal range of motion.       Right lower leg: Normal. He exhibits no tenderness and no edema.       Left lower leg: Normal. He  exhibits no tenderness and no edema.  Feet:  Right Foot:  Protective Sensation: 3 sites tested. 3 sites sensed.  Left Foot:  Protective Sensation: 3 sites tested. 3 sites sensed.  Neurological: He is alert. GCS eye subscore is 4. GCS verbal subscore is 5. GCS motor subscore is 6.  Speech is clear and goal oriented, follows commands Major Cranial nerves without deficit, no facial droop Normal strength in upper and lower extremities bilaterally including dorsiflexion and plantar flexion, strong and equal grip strength Sensation normal to light touch Moves extremities without ataxia, coordination intact Normal gait  Skin: Skin is warm and dry. Capillary refill takes less than 2 seconds.  Psychiatric: He has a normal mood and affect. His behavior is normal.   ED Treatments / Results  Labs (all labs ordered are listed, but only abnormal results are displayed) Labs Reviewed - No data to display  EKG None  Radiology Dg  Chest 2 View  Result Date: 10/31/2017 CLINICAL DATA:  Shortness of breath.  Wheezing. EXAM: CHEST - 2 VIEW COMPARISON:  One-view chest x-ray 12/23/2014 FINDINGS: Heart size is normal. Lungs are clear. There is no edema or effusion. No focal airspace disease is present. The visualized soft tissues and bony thorax are unremarkable. IMPRESSION: Negative two view chest x-ray Electronically Signed   By: Marin Roberts M.D.   On: 10/31/2017 12:09    Procedures Procedures (including critical care time)  Medications Ordered in ED Medications  albuterol (PROVENTIL HFA;VENTOLIN HFA) 108 (90 Base) MCG/ACT inhaler 1-2 puff (1 puff Inhalation Provided for home use 10/31/17 1358)  ipratropium-albuterol (DUONEB) 0.5-2.5 (3) MG/3ML nebulizer solution 3 mL (3 mLs Nebulization Given 10/31/17 1109)  ipratropium-albuterol (DUONEB) 0.5-2.5 (3) MG/3ML nebulizer solution 3 mL (3 mLs Nebulization Given 10/31/17 1246)  acetaminophen (TYLENOL) tablet 500 mg (500 mg Oral Given 10/31/17  1257)  AEROCHAMBER PLUS FLO-VU LARGE MISC 1 each (1 each Other Given 10/31/17 1358)     Initial Impression / Assessment and Plan / ED Course  I have reviewed the triage vital signs and the nursing notes.  Pertinent labs & imaging results that were available during my care of the patient were reviewed by me and considered in my medical decision making (see chart for details).  Clinical Course as of Oct 31 1513  Sat Oct 31, 2017  1237 Patient reassessed post first DuoNeb.  States that he is feeling improved.  Lungs still with bilateral mild expiratory wheezing.  Patient requesting a second DuoNeb.   [BM]  1357 Patient reassessed clear lung sounds bilaterally.  Patient states that he feels well and is requesting discharge.  Patient ambulatory with nurse without shortness of breath or wheezing.   [BM]  1428 Discussed with Dr. Effie Shy. Discharge with albuterol inhaler, short course of prednisone and follow-up.   [BM]    Clinical Course User Index [BM] Bill Salinas, PA-C   Patient presenting with 3 days of wheezing that he attributes to weather change and sleeping with window open. Improved after 2 nebulizes.  Patient ambulated in ED with O2 saturations maintained >90, no current signs of respiratory distress. Lung exam improved after nebulizer treatment. Pt states they are breathing at baseline.  Patient has been given albuterol inhaler with spacer in emergency department today.  Pt has been instructed to continue using prescribed medications and to speak with PCP about today's exacerbation.   Chest x-ray negative  Patient prescribed a short burst of prednisone to help with exacerbation symptoms.  Patient states that he is compliant with his HIV medications, no history of fever.  Patient afebrile, not tachycardic, not hypotensive, not tachypneic SPO2 greater than 95% on room air.  Patient denying any and all pain states feels well and wishes to be discharged at this time.  At this time  there does not appear to be any evidence of an acute emergency medical condition and the patient appears stable for discharge with appropriate outpatient follow up. Diagnosis was discussed with patient who verbalizes understanding of care plan and is agreeable to discharge. I have discussed return precautions with patient who verbalizes understanding of return precautions. Patient strongly encouraged to follow-up with their PCP. All questions answered.  Patient's case discussed with Dr. Effie Shy who agrees with plan to discharge with follow-up, albuterol and prednisone.    Note: Portions of this report may have been transcribed using voice recognition software. Every effort was made to ensure accuracy; however, inadvertent computerized transcription  errors may still be present.  Final Clinical Impressions(s) / ED Diagnoses   Final diagnoses:  Mild intermittent asthma with exacerbation    ED Discharge Orders         Ordered    predniSONE (DELTASONE) 10 MG tablet  Daily     10/31/17 1429           Bill Salinas, PA-C 10/31/17 1524    Mancel Bale, MD 10/31/17 [phone]

## 2017-10-31 NOTE — ED Notes (Signed)
Pt ambulated without difficulty, gait steady/even. HR remained 85-95 and oxygen saturation was 92% or higher while ambulating.

## 2017-10-31 NOTE — ED Notes (Signed)
ED PA at the bedside 

## 2017-10-31 NOTE — ED Notes (Signed)
Pt reports feeling much better after second breathing tx

## 2017-10-31 NOTE — Discharge Instructions (Addendum)
Please return to the Emergency Department for any new or worsening symptoms or if your symptoms do not improve. Please be sure to follow up with your Primary Care Physician as soon as possible regarding your visit today. If you do not have a Primary Doctor please use the resources below to establish one. You may use your albuterol inhaler with the spacer for asthma symptoms.  Please follow-up with your primary care provider as soon as possible regarding your visit today.  Please return to the emergency department if your wheezing is not resolved with albuterol inhaler. You may use the short course of prednisone as prescribed to help with your asthma.  Contact a doctor if: You have make a whistling sound when breaking (wheeze), have shortness of breath, or have a cough even if taking medicine to prevent attacks. The colored mucus you cough up (sputum) is thicker than usual. The colored mucus you cough up changes from clear or white to yellow, green, gray, or bloody. You have problems from the medicine you are taking such as: A rash. Itching. Swelling. Trouble breathing. You need reliever medicines more than 2-3 times a week. Your peak flow measurement is still at 50-79% of your personal best after following the action plan for 1 hour. You have a fever. Get help right away if: You seem to be worse and are not responding to medicine during an asthma attack. You are short of breath even at rest. You get short of breath when doing very little activity. You have trouble eating, drinking, or talking. You have chest pain. You have a fast heartbeat. Your lips or fingernails start to turn blue. You are light-headed, dizzy, or faint. Your peak flow is less than 50% of your personal best.  Do not take your medicine if  develop an itchy rash, swelling in your mouth or lips, or difficulty breathing.   RESOURCE GUIDE  Chronic Pain Problems: Contact [facility]   [phone] Patients need to be referred by their primary care doctor.  Insufficient Money for Medicine: Contact United Way:  call "211" or [facility] [phone].  No Primary Care Doctor: Call [facility] Connect  [phone] - can help you locate a primary care doctor that  accepts your insurance, provides certain services, etc. Physician Referral Service- [phone]  Agencies that provide inexpensive medical care: [facility]  [phone] [facility]  [phone] [facility]  [phone] [facility]  [phone] Planned Parenthood  [phone] [facility]  [phone]  Medicaid-accepting [facility] Providers: [facility]- [address], [address]  [phone], Mon-Fri 9am-7pm, Sat 9am-1pm [facility]- [address] [place], [address] [place]  [phone] [facility]- [address] [place]  [phone] [facility]- [address]  [phone] Renaye Rakers- [address] [place], [address], [phone]  Only accepts [place] patients after they have their name  applied to their card  Self Pay (no insurance) in [facility]: Sickle Cell Patients: Dr Willey Blade, [facility]  [address] [place], [phone] [facility]- [address] [place]  [phone]       [facility] [place]- [address]       -     [facility]- see information above (Speak to [employer] if you do not have insurance)       -  [facility]- [address], [phone]       -  [facility]- [address],  [phone]       -  [facility]- [address] [place] [address], [phone]       -  Dr Julio Sickssei-Bonsu-  [address], [address], [place], [phone]       -  [facility]- [address], [phone]       -  [facility]- [address] [place] [address], [phone], also [address], [phone]       -    [facility]- [address] [place], [phone], 1st & 3rd Saturday   every month, 10am-1pm  1) Find a Doctor and Pay Out of Pocket Although you won't have to find out who is covered by your insurance plan, it is a good idea to ask around and get recommendations. You will then need to call the office and see if the doctor you have chosen will accept you as a new patient and what types of options they offer for patients who are self-pay. Some doctors offer discounts or will set up payment plans for their patients who do not have insurance, but you will need to ask so you aren't surprised when you get to your appointment.  2) Contact Your Local Health Department Not all health departments have doctors that can see patients for sick visits, but many do, so it is worth a call to see if yours does. If you don't know where your local health department is, you can check in your phone book. The CDC also has a tool to help you locate your state's health department, and many state websites also have listings of all of their local health departments.  3) Find a Walk-in Clinic If your illness is not likely to be very severe or complicated, you may want to try a walk in clinic. These are popping up all over the country in pharmacies, drugstores, and shopping centers. They're usually staffed by nurse practitioners or physician assistants that have been trained to treat common illnesses and complaints. They're usually fairly quick and inexpensive. However, if you have serious medical issues or chronic medical problems, these are probably not your best option  STD Testing [facility] of [facility] [place], STD Clinic, [address], [place], phone [phone] or [phone].  Monday - Friday, call for an appointment. [facility] of [facility] [place], STD Clinic, [place], [place], phone [phone] or [phone].  Monday -  Friday, call for an appointment.  Abuse/Neglect: [facility] Child Abuse Hotline 617-016-7286([phone]) 514 374 1248 [facility] Child Abuse Hotline [phone] (After Hours)  Emergency Shelter:  [facility] [phone]) [phone]  Maternity Homes: [facility] [phone] [facility] [phone]  MRSA Hotline #:   9081412512308-835-8316  [facility] Resources  [facility]  United Way [facility] [address]                 [address]         [address] [place] [address]  [address] [place]  Cristobal Goldmann[place]                              [place] Phone:  [phone]                                  Phone:  [phone]                   Phone:  [phone]  [facility], [phone] [facility] Services - [facility]- [phone]       -     [facility] in [place], [address],                                  [phone], [facility]  [place] Child Abuse Hotline 306 259 7730(336) 863-277-0130 or 909-368-4774(336) 317-175-2614 (After Hours)   Behavioral Health Services  Substance Abuse Resources: Alcohol and Drug Services  [phone] [facility] [phone] [facility] [phone] [facility] [phone] Residential & Outpatient Substance Abuse Program  [phone]  Psychological Services: [facility]  [phone] [facility]  [phone] [facility], [phone] [place]. [address], [place], ACCESS LINE: [phone] or ([phone], [email or web address]/[email or web address]  Dental Assistance  If unable to pay or uninsured, contact:  [facility] or [facility] to become qualified for the adult dental clinic.  Patients with Medicaid: [facility] [phone], [phone] [address], [phone]  If unable to pay, or  uninsured, contact [facility] [phone]) or [facility] [phone] in [place], [phone] in [facility]) to become qualified for the adult dental clinic   Other Low-Cost Community Dental Services: [facility]- [address], [place], [place], [phone], [phone], [address]. [address], 2nd and 4th Thursday of the month at 6:30am.  10 clients each day by appointment, can sometimes see walk-in patients if someone does not show for an appointment. [facility]- [address], [place], [place], [id number], [phone] [facility]- [address], [place], [place], [address], [phone] [facility]- [phone] [facility]- [phone] [phone]- [phone]

## 2017-10-31 NOTE — ED Notes (Signed)
Patient verbalizes understanding of discharge instructions. Opportunity for questioning and answers were provided. Ambulatory at discharge in NAD.  

## 2017-10-31 NOTE — ED Notes (Signed)
Pt returned from imaging.

## 2017-11-09 ENCOUNTER — Other Ambulatory Visit: Payer: Self-pay | Admitting: Internal Medicine

## 2017-11-09 DIAGNOSIS — B20 Human immunodeficiency virus [HIV] disease: Secondary | ICD-10-CM

## 2017-12-13 ENCOUNTER — Other Ambulatory Visit: Payer: Self-pay | Admitting: Internal Medicine

## 2017-12-13 DIAGNOSIS — B20 Human immunodeficiency virus [HIV] disease: Secondary | ICD-10-CM

## 2018-01-08 ENCOUNTER — Other Ambulatory Visit: Payer: Self-pay | Admitting: Internal Medicine

## 2018-01-08 DIAGNOSIS — B20 Human immunodeficiency virus [HIV] disease: Secondary | ICD-10-CM

## 2018-02-06 ENCOUNTER — Other Ambulatory Visit: Payer: Self-pay | Admitting: Infectious Disease

## 2018-02-06 DIAGNOSIS — B20 Human immunodeficiency virus [HIV] disease: Secondary | ICD-10-CM

## 2018-02-08 ENCOUNTER — Other Ambulatory Visit: Payer: Self-pay | Admitting: Behavioral Health

## 2018-02-08 DIAGNOSIS — B20 Human immunodeficiency virus [HIV] disease: Secondary | ICD-10-CM

## 2018-02-08 DIAGNOSIS — Z79899 Other long term (current) drug therapy: Secondary | ICD-10-CM

## 2018-02-08 DIAGNOSIS — Z113 Encounter for screening for infections with a predominantly sexual mode of transmission: Secondary | ICD-10-CM

## 2018-02-09 ENCOUNTER — Other Ambulatory Visit: Payer: Medicare Other

## 2018-02-09 ENCOUNTER — Other Ambulatory Visit (HOSPITAL_COMMUNITY)
Admission: RE | Admit: 2018-02-09 | Discharge: 2018-02-09 | Disposition: A | Discharge status: Home / Self Care | Payer: Medicare Other | Source: Ambulatory Visit | Attending: Internal Medicine | Admitting: Internal Medicine

## 2018-02-09 DIAGNOSIS — Z79899 Other long term (current) drug therapy: Secondary | ICD-10-CM | POA: Diagnosis not present

## 2018-02-09 DIAGNOSIS — Z113 Encounter for screening for infections with a predominantly sexual mode of transmission: Secondary | ICD-10-CM | POA: Diagnosis not present

## 2018-02-09 DIAGNOSIS — B20 Human immunodeficiency virus [HIV] disease: Secondary | ICD-10-CM | POA: Diagnosis not present

## 2018-02-10 LAB — T-HELPER CELL (CD4) - (RCID CLINIC ONLY)
CD4 T CELL HELPER: 28 % — AB (ref 33–55)
CD4 T Cell Abs: 840 /uL (ref 400–2700)

## 2018-02-10 LAB — URINE CYTOLOGY ANCILLARY ONLY
Chlamydia: NEGATIVE
Neisseria Gonorrhea: NEGATIVE

## 2018-02-11 LAB — LIPID PANEL
Cholesterol: 134 mg/dL (ref <200)
HDL: 54 mg/dL (ref >40)
LDL Cholesterol (Calc): 59 mg/dL (calc)
Non-HDL Cholesterol (Calc): 80 mg/dL (calc) (ref <130)
Total CHOL/HDL Ratio: 2.5 (calc) (ref <5.0)
Triglycerides: 130 mg/dL (ref <150)

## 2018-02-11 LAB — CBC WITH DIFFERENTIAL/PLATELET
Absolute Monocytes: 458 cells/uL (ref 200–950)
Basophils Absolute: 53 cells/uL (ref 0–200)
Basophils Relative: 0.7 %
Eosinophils Absolute: 173 cells/uL (ref 15–500)
Eosinophils Relative: 2.3 %
HCT: 39.8 % (ref 38.5–50.0)
Hemoglobin: 14.3 g/dL (ref 13.2–17.1)
Lymphs Abs: 2925 cells/uL (ref 850–3900)
MCH: 36.4 pg — ABNORMAL HIGH (ref 27.0–33.0)
MCHC: 35.9 g/dL (ref 32.0–36.0)
MCV: 101.3 fL — ABNORMAL HIGH (ref 80.0–100.0)
MPV: 11.2 fL (ref 7.5–12.5)
Monocytes Relative: 6.1 %
Neutro Abs: 3893 cells/uL (ref 1500–7800)
Neutrophils Relative %: 51.9 %
Platelets: 216 10*3/uL (ref 140–400)
RBC: 3.93 10*6/uL — ABNORMAL LOW (ref 4.20–5.80)
RDW: 12.4 % (ref 11.0–15.0)
Total Lymphocyte: 39 %
WBC: 7.5 10*3/uL (ref 3.8–10.8)

## 2018-02-11 LAB — COMPREHENSIVE METABOLIC PANEL
AG Ratio: 1.8 (calc) (ref 1.0–2.5)
ALT: 26 U/L (ref 9–46)
AST: 24 U/L (ref 10–40)
Albumin: 4.8 g/dL (ref 3.6–5.1)
Alkaline phosphatase (APISO): 92 U/L (ref 40–115)
BUN: 11 mg/dL (ref 7–25)
CO2: 27 mmol/L (ref 20–32)
Calcium: 9.5 mg/dL (ref 8.6–10.3)
Chloride: 108 mmol/L (ref 98–110)
Creat: 1.14 mg/dL (ref 0.60–1.35)
Globulin: 2.7 g/dL (calc) (ref 1.9–3.7)
Glucose, Bld: 84 mg/dL (ref 65–99)
Potassium: 4 mmol/L (ref 3.5–5.3)
SODIUM: 144 mmol/L (ref 135–146)
Total Bilirubin: 0.2 mg/dL (ref 0.2–1.2)
Total Protein: 7.5 g/dL (ref 6.1–8.1)

## 2018-02-11 LAB — HIV-1 RNA QUANT-NO REFLEX-BLD
HIV 1 RNA Quant: <20 copies/mL
HIV-1 RNA Quant, Log: <1.3 Log copies/mL

## 2018-02-11 LAB — RPR TITER: RPR Titer: 1:8 {titer} — ABNORMAL HIGH

## 2018-02-11 LAB — FLUORESCENT TREPONEMAL AB(FTA)-IGG-BLD: Fluorescent Treponemal ABS: REACTIVE — AB

## 2018-02-11 LAB — RPR: RPR Ser Ql: REACTIVE — AB

## 2018-02-25 ENCOUNTER — Other Ambulatory Visit (HOSPITAL_COMMUNITY)
Admission: RE | Admit: 2018-02-25 | Discharge: 2018-02-25 | Disposition: A | Discharge status: Home / Self Care | Payer: Medicare Other | Source: Ambulatory Visit | Attending: Internal Medicine | Admitting: Internal Medicine

## 2018-02-25 ENCOUNTER — Ambulatory Visit (INDEPENDENT_AMBULATORY_CARE_PROVIDER_SITE_OTHER): Payer: Medicare Other | Admitting: Internal Medicine

## 2018-02-25 ENCOUNTER — Encounter: Payer: Self-pay | Admitting: Internal Medicine

## 2018-02-25 VITALS — BP 125/84 | HR 96 | Ht 72.0 in | Wt 134.8 lb

## 2018-02-25 DIAGNOSIS — B20 Human immunodeficiency virus [HIV] disease: Secondary | ICD-10-CM

## 2018-02-25 DIAGNOSIS — Z5181 Encounter for therapeutic drug level monitoring: Secondary | ICD-10-CM

## 2018-02-25 DIAGNOSIS — Z113 Encounter for screening for infections with a predominantly sexual mode of transmission: Secondary | ICD-10-CM | POA: Diagnosis not present

## 2018-02-25 DIAGNOSIS — Z23 Encounter for immunization: Secondary | ICD-10-CM | POA: Diagnosis not present

## 2018-02-27 LAB — CYTOLOGY, (ORAL, ANAL, URETHRAL) ANCILLARY ONLY
Chlamydia: NEGATIVE
Chlamydia: NEGATIVE
Neisseria Gonorrhea: NEGATIVE
Neisseria Gonorrhea: NEGATIVE

## 2018-03-01 DIAGNOSIS — Z23 Encounter for immunization: Secondary | ICD-10-CM | POA: Insufficient documentation

## 2018-03-01 DIAGNOSIS — Z5181 Encounter for therapeutic drug level monitoring: Secondary | ICD-10-CM | POA: Insufficient documentation

## 2018-03-01 NOTE — Assessment & Plan Note (Signed)
Discussed vaccines and given Prevnar, Menveo #2 Had flu shot this year

## 2018-03-01 NOTE — Assessment & Plan Note (Signed)
Creat, LFTs wnl.  

## 2018-03-01 NOTE — Assessment & Plan Note (Signed)
Will screen today 

## 2018-03-01 NOTE — Assessment & Plan Note (Signed)
Doing well, CD4 good and suppressed virus.  rtc 6 months.

## 2018-03-01 NOTE — Progress Notes (Signed)
   Subjective:    Patient ID: Daniel House, male    DOB: 05/01/1975, 43 y.o.   MRN: [id number]  HPI Here for follow up of HIV Taking Triumeq and no missed doses.  Feels well.  No associated n/v/d.  No new issues.  Sexually active with condoms. No complaints.   Review of Systems  Constitutional: Negative for fatigue.  Gastrointestinal: Negative for diarrhea.  Genitourinary: Negative for discharge.  Skin: Negative for rash.       Objective:   Physical Exam Constitutional:      General: He is not in acute distress.    Appearance: He is well-developed.  HENT:     Mouth/Throat:     Pharynx: No oropharyngeal exudate.  Cardiovascular:     Rate and Rhythm: Normal rate and regular rhythm.     Heart sounds: Normal heart sounds.  Pulmonary:     Effort: Pulmonary effort is normal. No respiratory distress.     Breath sounds: Normal breath sounds.  Lymphadenopathy:     Cervical: No cervical adenopathy.    SH: + tobacco       Assessment & Plan:   

## 2018-03-08 ENCOUNTER — Other Ambulatory Visit: Payer: Self-pay | Admitting: Internal Medicine

## 2018-03-08 DIAGNOSIS — B20 Human immunodeficiency virus [HIV] disease: Secondary | ICD-10-CM

## 2018-07-15 ENCOUNTER — Other Ambulatory Visit: Payer: Self-pay | Admitting: Internal Medicine

## 2018-07-15 DIAGNOSIS — B20 Human immunodeficiency virus [HIV] disease: Secondary | ICD-10-CM

## 2018-08-24 ENCOUNTER — Other Ambulatory Visit: Payer: Medicare Other

## 2018-08-24 ENCOUNTER — Other Ambulatory Visit: Payer: Self-pay

## 2018-08-24 DIAGNOSIS — B20 Human immunodeficiency virus [HIV] disease: Secondary | ICD-10-CM

## 2018-08-25 LAB — T-HELPER CELL (CD4) - (RCID CLINIC ONLY)
CD4 % Helper T Cell: 27 % — ABNORMAL LOW (ref 33–65)
CD4 T Cell Abs: 656 /uL (ref 400–1790)

## 2018-08-31 LAB — HIV-1 RNA QUANT-NO REFLEX-BLD
HIV 1 RNA Quant: 27 copies/mL — ABNORMAL HIGH
HIV-1 RNA Quant, Log: 1.43 Log copies/mL — ABNORMAL HIGH

## 2018-09-07 ENCOUNTER — Encounter: Payer: Self-pay | Admitting: Internal Medicine

## 2018-09-07 ENCOUNTER — Ambulatory Visit (INDEPENDENT_AMBULATORY_CARE_PROVIDER_SITE_OTHER): Payer: Medicare Other | Admitting: Internal Medicine

## 2018-09-07 ENCOUNTER — Other Ambulatory Visit: Payer: Self-pay

## 2018-09-07 VITALS — BP 134/91 | HR 86 | Temp 98.3°F

## 2018-09-07 DIAGNOSIS — Z79899 Other long term (current) drug therapy: Secondary | ICD-10-CM

## 2018-09-07 DIAGNOSIS — Z113 Encounter for screening for infections with a predominantly sexual mode of transmission: Secondary | ICD-10-CM | POA: Diagnosis not present

## 2018-09-07 DIAGNOSIS — B20 Human immunodeficiency virus [HIV] disease: Secondary | ICD-10-CM

## 2018-09-07 DIAGNOSIS — Z720 Tobacco use: Secondary | ICD-10-CM | POA: Diagnosis not present

## 2018-09-07 NOTE — Assessment & Plan Note (Signed)
Doing well, no issues.  rtc 6 months.  

## 2018-09-07 NOTE — Assessment & Plan Note (Signed)
Discussed cessation and quit time by his next visit in 6 months.

## 2018-09-07 NOTE — Progress Notes (Signed)
   Subjective:    Patient ID: Daniel House, male    DOB: [birth year], 43 y.o.   MRN: [id number]  HPI Here for follow up of HIV Taking Triumeq and no missed doses.  Feels well.  No associated n/v/d.  No new issues.  Sexually active with condoms. No complaints.   Review of Systems  Constitutional: Negative for fatigue.  Gastrointestinal: Negative for diarrhea.  Genitourinary: Negative for discharge.  Skin: Negative for rash.       Objective:   Physical Exam Constitutional:      General: He is not in acute distress.    Appearance: He is well-developed.  HENT:     Mouth/Throat:     Pharynx: No oropharyngeal exudate.  Cardiovascular:     Rate and Rhythm: Normal rate and regular rhythm.     Heart sounds: Normal heart sounds.  Pulmonary:     Effort: Pulmonary effort is normal. No respiratory distress.     Breath sounds: Normal breath sounds.  Lymphadenopathy:     Cervical: No cervical adenopathy.    SH: + tobacco       Assessment & Plan:

## 2018-10-13 DIAGNOSIS — Z23 Encounter for immunization: Secondary | ICD-10-CM | POA: Diagnosis not present

## 2018-12-09 ENCOUNTER — Other Ambulatory Visit: Payer: Self-pay | Admitting: Internal Medicine

## 2018-12-09 DIAGNOSIS — B20 Human immunodeficiency virus [HIV] disease: Secondary | ICD-10-CM

## 2019-03-08 NOTE — Addendum Note (Signed)
Addended by: Mariea Clonts D on: 03/08/2019 08:44 AM   Modules accepted: Orders

## 2019-03-10 ENCOUNTER — Other Ambulatory Visit: Payer: Medicare Other

## 2019-03-14 ENCOUNTER — Other Ambulatory Visit: Payer: Self-pay

## 2019-03-14 ENCOUNTER — Other Ambulatory Visit: Payer: Medicare Other

## 2019-03-14 ENCOUNTER — Other Ambulatory Visit (HOSPITAL_COMMUNITY)
Admission: RE | Admit: 2019-03-14 | Discharge: 2019-03-14 | Disposition: A | Discharge status: Home / Self Care | Payer: Medicare Other | Source: Ambulatory Visit | Attending: Internal Medicine | Admitting: Internal Medicine

## 2019-03-14 DIAGNOSIS — B20 Human immunodeficiency virus [HIV] disease: Secondary | ICD-10-CM | POA: Diagnosis not present

## 2019-03-14 DIAGNOSIS — Z79899 Other long term (current) drug therapy: Secondary | ICD-10-CM | POA: Diagnosis not present

## 2019-03-14 DIAGNOSIS — Z113 Encounter for screening for infections with a predominantly sexual mode of transmission: Secondary | ICD-10-CM | POA: Insufficient documentation

## 2019-03-15 LAB — T-HELPER CELL (CD4) - (RCID CLINIC ONLY)
CD4 % Helper T Cell: 28 % — ABNORMAL LOW (ref 33–65)
CD4 T Cell Abs: 699 /uL (ref 400–1790)

## 2019-03-16 LAB — URINE CYTOLOGY ANCILLARY ONLY
Chlamydia: NEGATIVE
Comment: NEGATIVE
Comment: NORMAL
Neisseria Gonorrhea: NEGATIVE

## 2019-03-17 LAB — LIPID PANEL
Cholesterol: 156 mg/dL (ref <200)
HDL: 59 mg/dL (ref >=40)
LDL Cholesterol (Calc): 76 mg/dL (calc)
Non-HDL Cholesterol (Calc): 97 mg/dL (calc) (ref <130)
Total CHOL/HDL Ratio: 2.6 (calc) (ref <5.0)
Triglycerides: 131 mg/dL (ref <150)

## 2019-03-17 LAB — CBC WITH DIFFERENTIAL/PLATELET
Absolute Monocytes: 485 cells/uL (ref 200–950)
Basophils Absolute: 41 cells/uL (ref 0–200)
Basophils Relative: 0.8 %
Eosinophils Absolute: 92 cells/uL (ref 15–500)
Eosinophils Relative: 1.8 %
HCT: 39.2 % (ref 38.5–50.0)
Hemoglobin: 13.7 g/dL (ref 13.2–17.1)
Lymphs Abs: 2621 cells/uL (ref 850–3900)
MCH: 36.1 pg — ABNORMAL HIGH (ref 27.0–33.0)
MCHC: 34.9 g/dL (ref 32.0–36.0)
MCV: 103.4 fL — ABNORMAL HIGH (ref 80.0–100.0)
MPV: 10.8 fL (ref 7.5–12.5)
Monocytes Relative: 9.5 %
Neutro Abs: 1862 cells/uL (ref 1500–7800)
Neutrophils Relative %: 36.5 %
Platelets: 229 10*3/uL (ref 140–400)
RBC: 3.79 10*6/uL — ABNORMAL LOW (ref 4.20–5.80)
RDW: 12.3 % (ref 11.0–15.0)
Total Lymphocyte: 51.4 %
WBC: 5.1 10*3/uL (ref 3.8–10.8)

## 2019-03-17 LAB — RPR TITER: RPR Titer: 1:16 {titer} — ABNORMAL HIGH

## 2019-03-17 LAB — COMPLETE METABOLIC PANEL WITH GFR
AG Ratio: 1.6 (calc) (ref 1.0–2.5)
ALT: 12 U/L (ref 9–46)
AST: 23 U/L (ref 10–40)
Albumin: 4.5 g/dL (ref 3.6–5.1)
Alkaline phosphatase (APISO): 85 U/L (ref 36–130)
BUN/Creatinine Ratio: 14 (calc) (ref 6–22)
BUN: 22 mg/dL (ref 7–25)
CO2: 29 mmol/L (ref 20–32)
Calcium: 9.6 mg/dL (ref 8.6–10.3)
Chloride: 105 mmol/L (ref 98–110)
Creat: 1.55 mg/dL — ABNORMAL HIGH (ref 0.60–1.35)
GFR, Est African American: 62 mL/min/{1.73_m2} (ref >=60)
GFR, Est Non African American: 54 mL/min/{1.73_m2} — ABNORMAL LOW (ref >=60)
Globulin: 2.8 g/dL (calc) (ref 1.9–3.7)
Glucose, Bld: 103 mg/dL — ABNORMAL HIGH (ref 65–99)
Potassium: 4.6 mmol/L (ref 3.5–5.3)
Sodium: 141 mmol/L (ref 135–146)
Total Bilirubin: 0.3 mg/dL (ref 0.2–1.2)
Total Protein: 7.3 g/dL (ref 6.1–8.1)

## 2019-03-17 LAB — FLUORESCENT TREPONEMAL AB(FTA)-IGG-BLD: Fluorescent Treponemal ABS: REACTIVE — AB

## 2019-03-17 LAB — HIV-1 RNA QUANT-NO REFLEX-BLD
HIV 1 RNA Quant: <20 copies/mL — AB
HIV-1 RNA Quant, Log: <1.3 Log copies/mL — AB

## 2019-03-17 LAB — RPR: RPR Ser Ql: REACTIVE — AB

## 2019-03-25 ENCOUNTER — Telehealth: Payer: Self-pay

## 2019-03-25 NOTE — Telephone Encounter (Signed)
COVID-19 Pre-Screening Questions:03/25/19  Do you currently have a fever (>100 F), chills or unexplained body aches?NO  Are you currently experiencing new cough, shortness of breath, sore throat, runny nose? NO  .  Have you recently travelled outside the state of [place] in the last 14 days?NO .  Have you been in contact with someone that is currently pending confirmation of Covid19 testing or has been confirmed to have the Covid19 virus? NO  **If the patient answers NO to ALL questions -  advise the patient to please call the clinic before coming to the office should any symptoms develop.     

## 2019-03-28 ENCOUNTER — Ambulatory Visit (INDEPENDENT_AMBULATORY_CARE_PROVIDER_SITE_OTHER): Payer: Medicare Other | Admitting: Internal Medicine

## 2019-03-28 ENCOUNTER — Encounter: Payer: Self-pay | Admitting: Internal Medicine

## 2019-03-28 ENCOUNTER — Other Ambulatory Visit: Payer: Self-pay

## 2019-03-28 VITALS — BP 127/92 | HR 84 | Temp 98.5°F | Wt 129.0 lb

## 2019-03-28 DIAGNOSIS — Z720 Tobacco use: Secondary | ICD-10-CM | POA: Diagnosis not present

## 2019-03-28 DIAGNOSIS — Z23 Encounter for immunization: Secondary | ICD-10-CM

## 2019-03-28 DIAGNOSIS — Z113 Encounter for screening for infections with a predominantly sexual mode of transmission: Secondary | ICD-10-CM

## 2019-03-28 DIAGNOSIS — Z5181 Encounter for therapeutic drug level monitoring: Secondary | ICD-10-CM

## 2019-03-28 DIAGNOSIS — B20 Human immunodeficiency virus [HIV] disease: Secondary | ICD-10-CM

## 2019-03-28 NOTE — Progress Notes (Signed)
   Subjective:    Patient ID: Daniel House, male    DOB: May 14, 1975, 44 y.o.   MRN: [id number]  HPI Here for follow up of HIV Continues on triumeq and sometimes forgets if he takes it or not, thinks he is doubling up.  No new issues.  Sexual activity with condoms only.     Review of Systems  Constitutional: Negative for fatigue.  Gastrointestinal: Negative for diarrhea.  Genitourinary: Negative for discharge.  Skin: Negative for rash.       Objective:   Physical Exam Constitutional:      General: He is not in acute distress.    Appearance: He is well-developed.  Cardiovascular:     Rate and Rhythm: Normal rate and regular rhythm.     Heart sounds: Normal heart sounds.  Pulmonary:     Effort: Pulmonary effort is normal. No respiratory distress.     Breath sounds: Normal breath sounds.  Psychiatric:        Mood and Affect: Mood normal.    SH: + tobacco       Assessment & Plan:

## 2019-03-28 NOTE — Assessment & Plan Note (Signed)
His creat remains up a bit, stable.  I have encouraged he get in with a PCP

## 2019-03-28 NOTE — Patient Instructions (Signed)
COVID-19 Vaccine Information can be found at: [email or web address] For questions related to vaccine distribution or appointments, please email [email or web address] or call [phone].    

## 2019-03-28 NOTE — Assessment & Plan Note (Signed)
Screened negative.  RPR remains up but consistent with previous and no new risks by his report.  No indication for treatment.

## 2019-03-28 NOTE — Assessment & Plan Note (Signed)
I encouraged him to get a PCP He is doing well with his medication.  Pill box given to help him keep track.  Follow up in 6 months.

## 2019-03-28 NOTE — Assessment & Plan Note (Signed)
I encouraged cessation 

## 2019-04-14 ENCOUNTER — Ambulatory Visit: Payer: Medicare Other

## 2019-05-03 DIAGNOSIS — Z23 Encounter for immunization: Secondary | ICD-10-CM | POA: Diagnosis not present

## 2019-06-09 ENCOUNTER — Other Ambulatory Visit: Payer: Self-pay | Admitting: Internal Medicine

## 2019-06-09 DIAGNOSIS — B20 Human immunodeficiency virus [HIV] disease: Secondary | ICD-10-CM

## 2019-09-19 ENCOUNTER — Other Ambulatory Visit: Payer: Self-pay

## 2019-09-19 ENCOUNTER — Other Ambulatory Visit: Payer: Medicare Other

## 2019-09-19 DIAGNOSIS — B20 Human immunodeficiency virus [HIV] disease: Secondary | ICD-10-CM | POA: Diagnosis not present

## 2019-09-20 LAB — T-HELPER CELL (CD4) - (RCID CLINIC ONLY)
CD4 % Helper T Cell: 33 % (ref 33–65)
CD4 T Cell Abs: 886 /uL (ref 400–1790)

## 2019-09-22 LAB — HIV-1 RNA QUANT-NO REFLEX-BLD
HIV 1 RNA Quant: <20 Copies/mL — ABNORMAL HIGH
HIV-1 RNA Quant, Log: <1.3 Log cps/mL — ABNORMAL HIGH

## 2019-10-03 ENCOUNTER — Other Ambulatory Visit: Payer: Self-pay

## 2019-10-03 ENCOUNTER — Encounter: Payer: Self-pay | Admitting: Internal Medicine

## 2019-10-03 ENCOUNTER — Ambulatory Visit (INDEPENDENT_AMBULATORY_CARE_PROVIDER_SITE_OTHER): Payer: Medicare Other | Admitting: Internal Medicine

## 2019-10-03 VITALS — BP 142/90 | HR 82 | Temp 98.5°F | Wt 127.0 lb

## 2019-10-03 DIAGNOSIS — B20 Human immunodeficiency virus [HIV] disease: Secondary | ICD-10-CM | POA: Diagnosis not present

## 2019-10-03 DIAGNOSIS — Z23 Encounter for immunization: Secondary | ICD-10-CM

## 2019-10-03 DIAGNOSIS — Z79899 Other long term (current) drug therapy: Secondary | ICD-10-CM | POA: Diagnosis not present

## 2019-10-03 DIAGNOSIS — Z113 Encounter for screening for infections with a predominantly sexual mode of transmission: Secondary | ICD-10-CM | POA: Diagnosis not present

## 2019-10-03 NOTE — Progress Notes (Signed)
   Subjective:    Patient ID: Daniel House, male    DOB: 01-Jun-1975, 44 y.o.   MRN: [id number]  HPI Here for follow up of HIV His CD4 remains good at 886 and viral load < 20.  He continues on triumeq.  He continues to smoke. No new complaints.      Review of Systems  Constitutional: Negative for fatigue.  Gastrointestinal: Negative for diarrhea and nausea.  Skin: Negative for rash.       Objective:   Physical Exam Constitutional:      Appearance: Normal appearance.  Skin:    Findings: No rash.  Neurological:     General: No focal deficit present.     Mental Status: He is alert.  Psychiatric:        Mood and Affect: Mood normal.   SH: + tobacco        Assessment & Plan:

## 2019-12-08 ENCOUNTER — Other Ambulatory Visit: Payer: Self-pay | Admitting: Internal Medicine

## 2019-12-08 DIAGNOSIS — B20 Human immunodeficiency virus [HIV] disease: Secondary | ICD-10-CM

## 2020-01-02 ENCOUNTER — Other Ambulatory Visit: Payer: Self-pay

## 2020-01-02 ENCOUNTER — Emergency Department (HOSPITAL_COMMUNITY)
Admission: EM | Admit: 2020-01-02 | Discharge: 2020-01-03 | Disposition: A | Discharge status: Home / Self Care | Payer: Medicare Other | Attending: Emergency Medicine | Admitting: Emergency Medicine

## 2020-01-02 DIAGNOSIS — K5900 Constipation, unspecified: Secondary | ICD-10-CM | POA: Insufficient documentation

## 2020-01-02 DIAGNOSIS — Z5321 Procedure and treatment not carried out due to patient leaving prior to being seen by health care provider: Secondary | ICD-10-CM | POA: Diagnosis not present

## 2020-01-02 LAB — COMPREHENSIVE METABOLIC PANEL
ALT: 16 U/L (ref 0–44)
AST: 20 U/L (ref 15–41)
Albumin: 3.5 g/dL (ref 3.5–5.0)
Alkaline Phosphatase: 103 U/L (ref 38–126)
Anion gap: 9 (ref 5–15)
BUN: 13 mg/dL (ref 6–20)
CO2: 26 mmol/L (ref 22–32)
Calcium: 9.4 mg/dL (ref 8.9–10.3)
Chloride: 103 mmol/L (ref 98–111)
Creatinine, Ser: 1.26 mg/dL — ABNORMAL HIGH (ref 0.61–1.24)
GFR, Estimated: >60 mL/min (ref >60)
Glucose, Bld: 111 mg/dL — ABNORMAL HIGH (ref 70–99)
Potassium: 4.2 mmol/L (ref 3.5–5.1)
Sodium: 138 mmol/L (ref 135–145)
Total Bilirubin: 0.4 mg/dL (ref 0.3–1.2)
Total Protein: 8.2 g/dL — ABNORMAL HIGH (ref 6.5–8.1)

## 2020-01-02 LAB — URINALYSIS, ROUTINE W REFLEX MICROSCOPIC
Bacteria, UA: NONE SEEN
Glucose, UA: NEGATIVE mg/dL
Hgb urine dipstick: NEGATIVE
Ketones, ur: NEGATIVE mg/dL
Nitrite: NEGATIVE
Protein, ur: 100 mg/dL — AB
Specific Gravity, Urine: 1.034 — ABNORMAL HIGH (ref 1.005–1.030)
pH: 5 (ref 5.0–8.0)

## 2020-01-02 LAB — CBC
HCT: 42 % (ref 39.0–52.0)
Hemoglobin: 13.6 g/dL (ref 13.0–17.0)
MCH: 34.1 pg — ABNORMAL HIGH (ref 26.0–34.0)
MCHC: 32.4 g/dL (ref 30.0–36.0)
MCV: 105.3 fL — ABNORMAL HIGH (ref 80.0–100.0)
Platelets: 283 10*3/uL (ref 150–400)
RBC: 3.99 MIL/uL — ABNORMAL LOW (ref 4.22–5.81)
RDW: 12.5 % (ref 11.5–15.5)
WBC: 5.7 10*3/uL (ref 4.0–10.5)
nRBC: 0 % (ref 0.0–0.2)

## 2020-01-02 LAB — LIPASE, BLOOD: Lipase: 30 U/L (ref 11–51)

## 2020-01-02 NOTE — ED Notes (Signed)
Pt name called multiple times no answers

## 2020-01-02 NOTE — ED Triage Notes (Signed)
Pt reports constipation x 2 weeks. Taking stool softeners with minimal relief. Reports blood in stool but has hx of hemmorhoids and thinks blood is from same. Denies n/v.

## 2020-01-09 ENCOUNTER — Encounter (HOSPITAL_COMMUNITY): Payer: Self-pay | Admitting: Emergency Medicine

## 2020-01-09 ENCOUNTER — Emergency Department (HOSPITAL_COMMUNITY)
Admission: EM | Admit: 2020-01-09 | Discharge: 2020-01-09 | Disposition: A | Discharge status: Home / Self Care | Payer: Medicare Other | Attending: Emergency Medicine | Admitting: Emergency Medicine

## 2020-01-09 DIAGNOSIS — K921 Melena: Secondary | ICD-10-CM | POA: Diagnosis not present

## 2020-01-09 DIAGNOSIS — Z7982 Long term (current) use of aspirin: Secondary | ICD-10-CM | POA: Diagnosis not present

## 2020-01-09 DIAGNOSIS — F17210 Nicotine dependence, cigarettes, uncomplicated: Secondary | ICD-10-CM | POA: Diagnosis not present

## 2020-01-09 DIAGNOSIS — N189 Chronic kidney disease, unspecified: Secondary | ICD-10-CM | POA: Diagnosis not present

## 2020-01-09 DIAGNOSIS — Z21 Asymptomatic human immunodeficiency virus [HIV] infection status: Secondary | ICD-10-CM | POA: Diagnosis not present

## 2020-01-09 LAB — CBC
HCT: 39.8 % (ref 39.0–52.0)
Hemoglobin: 12.9 g/dL — ABNORMAL LOW (ref 13.0–17.0)
MCH: 33.9 pg (ref 26.0–34.0)
MCHC: 32.4 g/dL (ref 30.0–36.0)
MCV: 104.7 fL — ABNORMAL HIGH (ref 80.0–100.0)
Platelets: 299 10*3/uL (ref 150–400)
RBC: 3.8 MIL/uL — ABNORMAL LOW (ref 4.22–5.81)
RDW: 12.4 % (ref 11.5–15.5)
WBC: 5.8 10*3/uL (ref 4.0–10.5)
nRBC: 0 % (ref 0.0–0.2)

## 2020-01-09 LAB — TYPE AND SCREEN
ABO/RH(D): O POS
Antibody Screen: NEGATIVE

## 2020-01-09 LAB — COMPREHENSIVE METABOLIC PANEL
ALT: 12 U/L (ref 0–44)
AST: 15 U/L (ref 15–41)
Albumin: 3.5 g/dL (ref 3.5–5.0)
Alkaline Phosphatase: 101 U/L (ref 38–126)
Anion gap: 9 (ref 5–15)
BUN: 14 mg/dL (ref 6–20)
CO2: 25 mmol/L (ref 22–32)
Calcium: 9.2 mg/dL (ref 8.9–10.3)
Chloride: 104 mmol/L (ref 98–111)
Creatinine, Ser: 1.03 mg/dL (ref 0.61–1.24)
GFR, Estimated: >60 mL/min (ref >60)
Glucose, Bld: 104 mg/dL — ABNORMAL HIGH (ref 70–99)
Potassium: 3.9 mmol/L (ref 3.5–5.1)
Sodium: 138 mmol/L (ref 135–145)
Total Bilirubin: 0.5 mg/dL (ref 0.3–1.2)
Total Protein: 7.7 g/dL (ref 6.5–8.1)

## 2020-01-09 LAB — POC OCCULT BLOOD, ED: Fecal Occult Bld: NEGATIVE

## 2020-01-09 MED ORDER — PREPARATION H 0.25-88.44 % RE SUPP: 1.0000 | RECTAL | 0 refills | Status: DC | PRN

## 2020-01-09 NOTE — ED Provider Notes (Signed)
[facility]-EMERGENCY DEPT Provider Note   CSN: [id number] Arrival date & time: 01/09/20  1018     History Chief Complaint  Patient presents with  . Blood In Stools    Daniel House is a 44 y.o. male.  HPI Patient is a 44 year old male with a past medical history of CKD, daily, HIV on medications (sees ID doctor w/ good CD4 and undetectable viral load).  Patient is presented to the ER today with complaint of 3 weeks of intermittent BRBPR with wiping.  He states he also has some irritation in this area.  He is a MSM states he has not had any ulcerative sex in quite some time.  He denies any discharge from his rectum denies any swelling or significant pain apart from when he is pooping.  He states he has chronic constipation has been try to increase his dietary fiber with MiraLAX and other additives.  Is been increasing his exercise as well.  He states that he is chronically strain.  He states that he has had some intermittent episodes of bright red blood PR in the past.  Has not seen a gastroenterologist per his recollection.  Did not see any evidence of GI visit in epic today.  He denies any melena or dark tarry stools, denies any abdominal pain, chest pain, shortness of breath, nausea or vomiting.  He has no other significant symptoms today.  No aggravating or mitigating factors.  He states he has not noticed any bleeding apart from when he poops.  He has not seen any blood in the toilet bowl only when he wipes.     Past Medical History:  Diagnosis Date  . Chronic kidney disease   . Hematochezia 05/31/2011  . HIV infection (HCC) 04/2011  . PCP (pneumocystis jiroveci pneumonia) (HCC) 05/12/2011  . Pneumonia 04/2011   PCP pneumonia     Patient Active Problem List   Diagnosis Date Noted  . Medication monitoring encounter 03/01/2018  . Rash and nonspecific skin eruption 04/21/2016  . Tobacco abuse 04/21/2016  . Encephalomalacia on imaging study 01/02/2015  .  Seizure disorder (HCC) 01/02/2015  . Screening examination for venereal disease 09/22/2013  . Encounter for long-term (current) use of medications 09/22/2013  . Brachial plexopathy 02/01/2013  . Plantar fasciitis 09/18/2011  . Medial orbital wall fracture (HCC) 05/31/2011  . Retinopathy of both eyes 05/31/2011  . Hepatitis C 05/13/2011  . Syphilis in male 05/12/2011  . HIV disease (HCC) 04/30/2011    Class: Acute    Past Surgical History:  Procedure Laterality Date  . APPENDECTOMY  1996  . ORIF WRIST FRACTURE Left 12/23/2014   Procedure: OPEN REDUCTION INTERNAL FIXATION (ORIF) WRIST FRACTURE;  Surgeon: Knute Neu, MD;  Location: [facility] OR;  Service: Plastics;  Laterality: Left;       No family history on file.  Social History   Tobacco Use  . Smoking status: Current Every Day Smoker    Packs/day: 0.30    Years: 20.00    Pack years: 6.00    Types: Cigarettes  . Smokeless tobacco: Never Used  . Tobacco comment: trying to quit  Substance Use Topics  . Alcohol use: No    Alcohol/week: 0.0 standard drinks  . Drug use: No    Comment: no drugs x 1 year    Home Medications Prior to Admission medications   Medication Sig Start Date End Date Taking? Authorizing Provider  Aspirin-Salicylamide-Caffeine (BC HEADACHE POWDER PO) Take 1 packet by mouth as needed (  headache).     [provider]  cyclobenzaprine (FLEXERIL) 10 MG tablet Take 1 tablet (10 mg total) by mouth 2 (two) times daily as needed for muscle spasms. Patient not taking: Reported on 09/07/2018 02/09/17   Bethel Born, PA-C  shark liver oil-cocoa butter (PREPARATION H) 0.25-88.44 % suppository Place 1 suppository rectally as needed for hemorrhoids. 01/09/20   Gailen Shelter, PA  triamcinolone (KENALOG) 0.025 % ointment APPLY EXTERNALLY TO THE AFFECTED AREA TWICE DAILY Patient not taking: Reported on 03/28/2019 12/14/17   Gardiner Barefoot, MD  TRIUMEQ 600-50-300 MG tablet TAKE 1 TABLET BY MOUTH DAILY  12/08/19   Comer, Belia Heman, MD    Allergies    Bactrim [sulfamethoxazole-trimethoprim] and Truvada [emtricitabine-tenofovir df]  Review of Systems   Review of Systems  Constitutional: Negative for chills and fever.  HENT: Negative for congestion.   Eyes: Negative for pain.  Respiratory: Negative for cough and shortness of breath.   Cardiovascular: Negative for chest pain and leg swelling.  Gastrointestinal: Positive for blood in stool (with whiping/not in stool). Negative for abdominal pain, nausea and vomiting.  Genitourinary: Negative for dysuria.  Musculoskeletal: Negative for myalgias.  Skin: Negative for rash.  Neurological: Negative for dizziness and headaches.    Physical Exam Updated Vital Signs BP 137/89   Pulse 68   Temp 98.8 F (37.1 C) (Oral)   Resp 18   SpO2 100%   Physical Exam Vitals and nursing note reviewed.  Constitutional:      General: He is not in acute distress.    Appearance: Normal appearance. He is not ill-appearing.  HENT:     Head: Normocephalic and atraumatic.  Eyes:     General: No scleral icterus.       Right eye: No discharge.        Left eye: No discharge.     Conjunctiva/sclera: Conjunctivae normal.  Pulmonary:     Effort: Pulmonary effort is normal.     Breath sounds: No stridor.  Abdominal:     General: Abdomen is flat. Bowel sounds are normal.     Palpations: Abdomen is soft.     Tenderness: There is no abdominal tenderness. There is no right CVA tenderness, left CVA tenderness, guarding or rebound.  Genitourinary:    Comments: Rectal exam without any obvious external hemorrhoids or fissures.  Internal exam with possible small internal hemorrhoid at the 3 o'clock position. Stool without any BRB or melena. Neurological:     Mental Status: He is alert and oriented to person, place, and time. Mental status is at baseline.     ED Results / Procedures / Treatments   Labs (all labs ordered are listed, but only abnormal results  are displayed) Labs Reviewed  COMPREHENSIVE METABOLIC PANEL - Abnormal; Notable for the following components:      Result Value   Glucose, Bld 104 (*)    All other components within normal limits  CBC - Abnormal; Notable for the following components:   RBC 3.80 (*)    Hemoglobin 12.9 (*)    MCV 104.7 (*)    All other components within normal limits  POC OCCULT BLOOD, ED  TYPE AND SCREEN    EKG None  Radiology No results found.  Procedures Procedures (including critical care time)  Medications Ordered in ED Medications - No data to display  ED Course  I have reviewed the triage vital signs and the nursing notes.  Pertinent labs & imaging results that were available  during my care of the patient were reviewed by me and considered in my medical decision making (see chart for details).    MDM Rules/Calculators/A&P                          Patient is 44 year old male with past medical history of HIV which is well treated by infectious disease doctor he follows regularly with.  He states he is not PCP is never seen GI doctor in the past.  Patient has had bright red blood PR for 3 weeks.  He is noticing it only when he wipes.  He has a history of chronic constipation.  He has no evidence of rectal abscess or perianal abscess.  He has no infectious symptoms.  He does engage in anal receptive sex states that he does so with only one partner.  Has had no symptoms of gonorrhea chlamydia of the anus.  Rectal exam is also without any melena or hematochezia.  Fecal occult negative.  I have low suspicion for fissure as I was not able to see this on exam however he does have risk factors for this. Possible palpable hemorrhoid.  We will treat as hemorrhoids for now with sitz baths and suppository will have him follow-up with gastroenterologist and PCP.  He was given return precautions.  CBC with hemoglobin only marginally changed from prior.  No significant anemia.  CBC without any  significant abnormalities.  Patient understanding of plan this time.  Agreeable to plan and ambulatory at discharge.  He has no further questions at this time.  Vital signs within normal limits.   Final Clinical Impression(s) / ED Diagnoses Final diagnoses:  Blood in stool    Rx / DC Orders ED Discharge Orders         Ordered    shark liver oil-cocoa butter (PREPARATION H) 0.25-88.44 % suppository  As needed        01/09/20 1542           Solon Augusta Helemano, [place] 01/09/20 1556    Pollyann Savoy, MD 01/09/20 1627

## 2020-01-09 NOTE — Discharge Instructions (Addendum)
As you are having blood in your stool has been ongoing issue for 3 weeks I think it is reasonable to have you follow-up with a primary care doctor as well as a gastroenterologist.  Please continue to monitor your symptoms.  You may always return to the ER for new or concerning symptoms.  Your blood counts, other lab work and physical exam are reassuring today.  You may do sitz baths as we discussed, I also prescribed you a suppository to use.  You may also use Preparation H wipes.  However, given that I did not identify a hemorrhoid with any certainty on my examination today it is reasonable to see a GI doctor to further evaluate this.  Please call the gastroenterologist to make an appointment  Please also call the [facility] on [address] to make an appointment.

## 2020-01-09 NOTE — ED Triage Notes (Signed)
Per pt, states having bright red blood in stool when he has a BM-history of hemorrhoids-symptoms for 3 weeks-no abdominal pain-symptoms have occurred in past

## 2020-02-22 ENCOUNTER — Inpatient Hospital Stay: Payer: Medicare Other | Admitting: Family Medicine

## 2020-03-07 DIAGNOSIS — Z23 Encounter for immunization: Secondary | ICD-10-CM | POA: Diagnosis not present

## 2020-04-09 ENCOUNTER — Ambulatory Visit: Payer: Medicare Other | Attending: Family Medicine | Admitting: Family Medicine

## 2020-04-09 ENCOUNTER — Encounter: Payer: Self-pay | Admitting: Family Medicine

## 2020-04-09 ENCOUNTER — Other Ambulatory Visit: Payer: Self-pay

## 2020-04-09 DIAGNOSIS — Z1211 Encounter for screening for malignant neoplasm of colon: Secondary | ICD-10-CM

## 2020-04-09 DIAGNOSIS — K648 Other hemorrhoids: Secondary | ICD-10-CM

## 2020-04-09 DIAGNOSIS — K921 Melena: Secondary | ICD-10-CM | POA: Diagnosis not present

## 2020-04-09 MED ORDER — POLYETHYLENE GLYCOL 3350 17 GM/SCOOP PO POWD: 17.0000 g | Freq: Two times a day (BID) | ORAL | 1 refills | Status: DC | PRN

## 2020-04-09 NOTE — Progress Notes (Signed)
   Virtual Visit via Telephone Note  I connected with Daniel House, on 04/09/2020 at 9:30 AM by telephone due to the COVID-19 pandemic and verified that I am speaking with the correct person using two identifiers.   Consent: I discussed the limitations, risks, security and privacy concerns of performing an evaluation and management service by telephone and the availability of in person appointments. I also discussed with the patient that there may be a patient responsible charge related to this service. The patient expressed understanding and agreed to proceed.   Location of Patient: Home  Location of Provider: Clinic   Persons participating in Telemedicine visit: Daniel House Dr. Alvis Lemmings     History of Present Illness: 45 year old male with a history of HIV (on antiretroviral therapy, viral load less than 20, CD4 count 886) who presents today to establish care.  He has had hematochezia and was informed he could have hemorrhoids.  Seen at urgent care for this in 12/2019.  Every once in a while he notices dark red blood in his stool. He strains during bowel movements despite using laxatives. Also using hemorrhoid gels and creams.  Past Medical History:  Diagnosis Date  . Chronic kidney disease   . Hematochezia 05/31/2011  . HIV infection (HCC) 04/2011  . PCP (pneumocystis jiroveci pneumonia) (HCC) 05/12/2011  . Pneumonia 04/2011   PCP pneumonia    Allergies  Allergen Reactions  . Bactrim [Sulfamethoxazole-Trimethoprim] Other (See Comments)    Renal Failure  . Truvada [Emtricitabine-Tenofovir Df] Other (See Comments)    Renal Failure    Current Outpatient Medications on File Prior to Visit  Medication Sig Dispense Refill  . Aspirin-Salicylamide-Caffeine (BC HEADACHE POWDER PO) Take 1 packet by mouth as needed (headache).     . cyclobenzaprine (FLEXERIL) 10 MG tablet Take 1 tablet (10 mg total) by mouth 2 (two) times daily as needed for muscle spasms. (Patient not taking: Reported  on 09/07/2018) 20 tablet 0  . shark liver oil-cocoa butter (PREPARATION H) 0.25-88.44 % suppository Place 1 suppository rectally as needed for hemorrhoids. 12 suppository 0  . triamcinolone (KENALOG) 0.025 % ointment APPLY EXTERNALLY TO THE AFFECTED AREA TWICE DAILY (Patient not taking: Reported on 03/28/2019) 30 g 0  . TRIUMEQ 600-50-300 MG tablet TAKE 1 TABLET BY MOUTH DAILY 30 tablet 5   No current facility-administered medications on file prior to visit.    ROS: See HPI  Observations/Objective: Awake, alert, and 2x3 Not in acute distress Normal mood  Assessment and Plan: 1. Hematochezia Likely secondary to hemorrhoids We will also refer for colonoscopy as he is due - Ambulatory referral to Gastroenterology  2. Screening for colon cancer - Ambulatory referral to Gastroenterology  3. Other hemorrhoids Counseled on prevention of constipation MiraLAX has been prescribed Use sitz bath   Follow Up Instructions: 3 months   I discussed the assessment and treatment plan with the patient. The patient was provided an opportunity to ask questions and all were answered. The patient agreed with the plan and demonstrated an understanding of the instructions.   The patient was advised to call back or seek an in-person evaluation if the symptoms worsen or if the condition fails to improve as anticipated.     I provided 12 minutes total of non-face-to-face time during this encounter.   Hoy Register, MD, FAAFP. [facility] [place], [place] [phone]   04/09/2020, 9:30 AM

## 2020-04-24 ENCOUNTER — Other Ambulatory Visit: Payer: Self-pay

## 2020-04-24 ENCOUNTER — Encounter: Payer: Self-pay | Admitting: Gastroenterology

## 2020-04-24 ENCOUNTER — Ambulatory Visit (INDEPENDENT_AMBULATORY_CARE_PROVIDER_SITE_OTHER): Payer: Medicare Other | Admitting: Gastroenterology

## 2020-04-24 VITALS — BP 100/58 | HR 114 | Ht 71.26 in | Wt 123.2 lb

## 2020-04-24 DIAGNOSIS — K648 Other hemorrhoids: Secondary | ICD-10-CM | POA: Diagnosis not present

## 2020-04-24 DIAGNOSIS — K5900 Constipation, unspecified: Secondary | ICD-10-CM | POA: Diagnosis not present

## 2020-04-24 DIAGNOSIS — K625 Hemorrhage of anus and rectum: Secondary | ICD-10-CM | POA: Diagnosis not present

## 2020-04-24 MED ORDER — SUTAB 1479-225-188 MG PO TABS: 1.0000 | ORAL_TABLET | Freq: Once | ORAL | 0 refills | Status: AC

## 2020-04-24 NOTE — Patient Instructions (Addendum)
If you are age 45 or older, your body mass index should be between 23-30. Your Body mass index is 17.06 kg/m. If this is out of the aforementioned range listed, please consider follow up with your Primary Care Provider.  If you are age 54 or younger, your body mass index should be between 19-25. Your Body mass index is 17.06 kg/m. If this is out of the aformentioned range listed, please consider follow up with your Primary Care Provider.   You have been scheduled for a colonoscopy. Please follow written instructions given to you at your visit today.  Please pick up your prep supplies at the pharmacy within the next 1-3 days. If you use inhalers (even only as needed), please bring them with you on the day of your procedure.  Use Miralax 1 capful daily in 8 ounces of water or juice daily. We have given you samples of packets today and a few coupons.   Follow up pending your Colonoscopy.  Thank you for entrusting me with your care and for choosing [facility], Dr. Ileene Patrick

## 2020-04-24 NOTE — Progress Notes (Signed)
HPI :  45 year old male with a remote history of seizures, history of well-controlled HIV, CKD, referred by Hoy Register MD for rectal bleeding.  Patient states he has had new onset rectal bleeding since November.  When this first started in November he had what sounds like significant blood in the stool, in the toilet bowl, and on the toilet paper.  He had pain with a bowel movement when this was occurring.  He endorsed constipation when this occurred and had been straining with his bowel movements.  He was seen in the emergency department and was told he had inflamed hemorrhoids causing his symptoms.  It was recommended that he take MiraLAX for his bowels and was given some Preparation H suppositories for this.  He states he has been using Ex-Lax over-the-counter in recent months and recently using MiraLAX.  He states the MiraLAX definitely does seem to help keep the stool softer minimize constipation.  He states over time the bleeding has gotten much better in regards to frequency and volume, however he still sees some bright red blood in the stool and on the toilet paper at times.  He denies any family history of colon cancer.  He has never had a prior colonoscopy.  He does have occasional rectal pain that persists at times as well.  He states his HIV is well controlled.  Last CD4 count in August 2021 was 886.  Viral load undetectable.  He states he had a seizure after he suffered a fall years ago, he has not had a seizure since then.  He denies any cardiopulmonary symptoms.  He states he is never had a problem with anesthesia in the past.     Past Medical History:  Diagnosis Date  . Chronic kidney disease   . Hematochezia 05/31/2011  . HIV infection (HCC) 04/2011  . PCP (pneumocystis jiroveci pneumonia) (HCC) 05/12/2011  . Pneumonia 04/2011   PCP pneumonia   . Seizures (HCC)      Past Surgical History:  Procedure Laterality Date  . APPENDECTOMY  1996  . ORIF WRIST FRACTURE Left  12/23/2014   Procedure: OPEN REDUCTION INTERNAL FIXATION (ORIF) WRIST FRACTURE;  Surgeon: Knute Neu, MD;  Location: [facility] OR;  Service: Plastics;  Laterality: Left;   Family History  Family history unknown: Yes   Social History   Tobacco Use  . Smoking status: Current Every Day Smoker    Packs/day: 0.30    Years: 20.00    Pack years: 6.00    Types: Cigarettes  . Smokeless tobacco: Never Used  . Tobacco comment: trying to quit  Vaping Use  . Vaping Use: Never used  Substance Use Topics  . Alcohol use: Yes    Alcohol/week: 0.0 standard drinks    Comment: 1-2 per day  . Drug use: No    Comment: no drugs x 1 year   Current Outpatient Medications  Medication Sig Dispense Refill  . polyethylene glycol powder (GLYCOLAX/MIRALAX) 17 GM/SCOOP powder Take 17 g by mouth 2 (two) times daily as needed. 3350 g 1  . shark liver oil-cocoa butter (PREPARATION H) 0.25-88.44 % suppository Place 1 suppository rectally as needed for hemorrhoids. 12 suppository 0  . TRIUMEQ 600-50-300 MG tablet TAKE 1 TABLET BY MOUTH DAILY 30 tablet 5   No current facility-administered medications for this visit.   Allergies  Allergen Reactions  . Bactrim [Sulfamethoxazole-Trimethoprim] Other (See Comments)    Renal Failure  . Truvada [Emtricitabine-Tenofovir Df] Other (See Comments)    Renal Failure  Review of Systems: All systems reviewed and negative except where noted in HPI.   Lab Results  Component Value Date   WBC 5.8 01/09/2020   HGB 12.9 (L) 01/09/2020   HCT 39.8 01/09/2020   MCV 104.7 (H) 01/09/2020   PLT 299 01/09/2020    Lab Results  Component Value Date   CREATININE 1.03 01/09/2020   BUN 14 01/09/2020   NA 138 01/09/2020   K 3.9 01/09/2020   CL 104 01/09/2020   CO2 25 01/09/2020    Lab Results  Component Value Date   ALT 12 01/09/2020   AST 15 01/09/2020   ALKPHOS 101 01/09/2020   BILITOT 0.5 01/09/2020     Physical Exam: BP (!) 100/58 (BP Location: Left Arm, Patient  Position: Sitting, Cuff Size: Normal)   Pulse (!) 114   Ht 5' 11.26" (1.81 m) Comment: height measured without shoes  Wt 123 lb 4 oz (55.9 kg)   BMI 17.06 kg/m  Constitutional: Pleasant,well-developed, male in no acute distress. HEENT: Normocephalic and atraumatic. Conjunctivae are normal. No scleral icterus. Neck supple.  Cardiovascular: Normal rate, regular rhythm.  Pulmonary/chest: Effort normal and breath sounds normal. No wheezing, rales or rhonchi. Abdominal: Soft, nondistended, nontender.  There are no masses palpable.  DRE / Anoscopy - no fissure, no mass lesions, internal hemorrhoids - CMA Lucius Conn as standby Extremities: no edema Lymphadenopathy: No cervical adenopathy noted. Neurological: Alert and oriented to person place and time. Skin: Skin is warm and dry. No rashes noted. Psychiatric: Normal mood and affect. Behavior is normal.   ASSESSMENT AND PLAN: 45 year old man here for new patient assessment of the following:  Rectal bleeding Internal hemorrhoids Constipation  Based on history and exam findings it is very likely that his bleeding symptoms are due to internal hemorrhoids in the setting of constipation.  He appears to be improving with conservative measures however he continues to have some mild rectal bleeding.  I discussed differential diagnosis with him otherwise.  Given his age he is due for screening colonoscopy regardless, I am recommending a colonoscopy to perform his screening and exclude other causes for his rectal bleeding.  I discussed risks and benefits of colonoscopy and anesthesia with him and he wants to proceed.  Further recommendations pending results.  Otherwise recommend he use MiraLAX every day to keep his stools soft and prevent straining.  Preparation H suppositories seem to work well for him as needed, he will continue to use those as needed, does not use them regularly.  Ileene Patrick, MD [facility]  CC: Hoy Register,  MD

## 2020-05-10 ENCOUNTER — Telehealth: Payer: Self-pay

## 2020-05-10 ENCOUNTER — Other Ambulatory Visit: Payer: Self-pay | Admitting: Internal Medicine

## 2020-05-10 DIAGNOSIS — B20 Human immunodeficiency virus [HIV] disease: Secondary | ICD-10-CM

## 2020-05-10 NOTE — Telephone Encounter (Signed)
Called patient to get some follow ups scheduled, left a voicemail to call us back and get those scheduled

## 2020-05-21 ENCOUNTER — Telehealth: Payer: Self-pay

## 2020-05-21 NOTE — Telephone Encounter (Signed)
Patient is scheduled for colonoscopy on 6-7 with Dr. Adela Lank is on the wait list.  LM for patient to call back to discuss moving his procedure up sooner. We have openings this week and next that we could reschedule him for with Dr. Adela Lank so he doesn't have to wait until 06-26-20.

## 2020-05-23 ENCOUNTER — Other Ambulatory Visit: Payer: Medicare Other

## 2020-05-23 ENCOUNTER — Other Ambulatory Visit: Payer: Self-pay

## 2020-05-23 DIAGNOSIS — Z79899 Other long term (current) drug therapy: Secondary | ICD-10-CM | POA: Diagnosis not present

## 2020-05-23 DIAGNOSIS — B20 Human immunodeficiency virus [HIV] disease: Secondary | ICD-10-CM | POA: Diagnosis not present

## 2020-05-23 DIAGNOSIS — Z113 Encounter for screening for infections with a predominantly sexual mode of transmission: Secondary | ICD-10-CM | POA: Diagnosis not present

## 2020-05-24 ENCOUNTER — Telehealth: Payer: Self-pay

## 2020-05-24 LAB — T-HELPER CELL (CD4) - (RCID CLINIC ONLY)
CD4 % Helper T Cell: 33 % (ref 33–65)
CD4 T Cell Abs: 516 /uL (ref 400–1790)

## 2020-05-24 NOTE — Telephone Encounter (Signed)
-----   Message from Gardiner Barefoot, MD sent at 05/24/2020  3:40 PM EDT ----- Positive syphilis and needs Bicillin 2.4 million units x 1.  thanks

## 2020-05-24 NOTE — Telephone Encounter (Signed)
Patient advised of positive syphilis results and treatment needed. Patient agrees to appointment tomorrow and also made aware to make partner aware so they can be tested and treated at the health department. Patient denies a any symptoms at this time.  Daniel House

## 2020-05-25 ENCOUNTER — Ambulatory Visit (INDEPENDENT_AMBULATORY_CARE_PROVIDER_SITE_OTHER): Payer: Medicare Other

## 2020-05-25 ENCOUNTER — Other Ambulatory Visit: Payer: Self-pay

## 2020-05-25 DIAGNOSIS — A539 Syphilis, unspecified: Secondary | ICD-10-CM | POA: Diagnosis not present

## 2020-05-25 MED ORDER — PENICILLIN G BENZATHINE 1200000 UNIT/2ML IM SUSY
1.2000 10*6.[IU] | PREFILLED_SYRINGE | Freq: Once | INTRAMUSCULAR | Status: AC
  Administered 2020-05-25: 1.2 10*6.[IU] via INTRAMUSCULAR

## 2020-05-27 LAB — COMPLETE METABOLIC PANEL WITH GFR
AG Ratio: 0.9 (calc) — ABNORMAL LOW (ref 1.0–2.5)
ALT: 10 U/L (ref 9–46)
AST: 18 U/L (ref 10–40)
Albumin: 4 g/dL (ref 3.6–5.1)
Alkaline phosphatase (APISO): 119 U/L (ref 36–130)
BUN: 12 mg/dL (ref 7–25)
CO2: 27 mmol/L (ref 20–32)
Calcium: 9.5 mg/dL (ref 8.6–10.3)
Chloride: 105 mmol/L (ref 98–110)
Creat: 1.27 mg/dL (ref 0.60–1.35)
GFR, Est African American: 79 mL/min/{1.73_m2} (ref >=60)
GFR, Est Non African American: 68 mL/min/{1.73_m2} (ref >=60)
Globulin: 4.5 g/dL (calc) — ABNORMAL HIGH (ref 1.9–3.7)
Glucose, Bld: 92 mg/dL (ref 65–99)
Potassium: 4.9 mmol/L (ref 3.5–5.3)
Sodium: 138 mmol/L (ref 135–146)
Total Bilirubin: 0.2 mg/dL (ref 0.2–1.2)
Total Protein: 8.5 g/dL — ABNORMAL HIGH (ref 6.1–8.1)

## 2020-05-27 LAB — CBC WITH DIFFERENTIAL/PLATELET
Absolute Monocytes: 559 cells/uL (ref 200–950)
Basophils Absolute: 28 cells/uL (ref 0–200)
Basophils Relative: 0.6 %
Eosinophils Absolute: 197 cells/uL (ref 15–500)
Eosinophils Relative: 4.2 %
HCT: 36 % — ABNORMAL LOW (ref 38.5–50.0)
Hemoglobin: 12.2 g/dL — ABNORMAL LOW (ref 13.2–17.1)
Lymphs Abs: 1678 cells/uL (ref 850–3900)
MCH: 33.2 pg — ABNORMAL HIGH (ref 27.0–33.0)
MCHC: 33.9 g/dL (ref 32.0–36.0)
MCV: 98.1 fL (ref 80.0–100.0)
MPV: 9.8 fL (ref 7.5–12.5)
Monocytes Relative: 11.9 %
Neutro Abs: 2237 cells/uL (ref 1500–7800)
Neutrophils Relative %: 47.6 %
Platelets: 347 10*3/uL (ref 140–400)
RBC: 3.67 10*6/uL — ABNORMAL LOW (ref 4.20–5.80)
RDW: 13.1 % (ref 11.0–15.0)
Total Lymphocyte: 35.7 %
WBC: 4.7 10*3/uL (ref 3.8–10.8)

## 2020-05-27 LAB — HIV-1 RNA QUANT-NO REFLEX-BLD
HIV 1 RNA Quant: <20 Copies/mL — ABNORMAL HIGH
HIV-1 RNA Quant, Log: <1.3 Log cps/mL — ABNORMAL HIGH

## 2020-05-27 LAB — LIPID PANEL
Cholesterol: 129 mg/dL (ref <200)
HDL: 55 mg/dL (ref >=40)
LDL Cholesterol (Calc): 61 mg/dL (calc)
Non-HDL Cholesterol (Calc): 74 mg/dL (calc) (ref <130)
Total CHOL/HDL Ratio: 2.3 (calc) (ref <5.0)
Triglycerides: 58 mg/dL (ref <150)

## 2020-05-27 LAB — RPR: RPR Ser Ql: REACTIVE — AB

## 2020-05-27 LAB — RPR TITER: RPR Titer: 1:64 {titer} — ABNORMAL HIGH

## 2020-05-27 LAB — FLUORESCENT TREPONEMAL AB(FTA)-IGG-BLD: Fluorescent Treponemal ABS: REACTIVE — AB

## 2020-06-08 ENCOUNTER — Other Ambulatory Visit: Payer: Self-pay | Admitting: Internal Medicine

## 2020-06-08 ENCOUNTER — Encounter: Payer: Medicare Other | Admitting: Internal Medicine

## 2020-06-08 DIAGNOSIS — B20 Human immunodeficiency virus [HIV] disease: Secondary | ICD-10-CM

## 2020-06-26 ENCOUNTER — Encounter: Payer: Medicare Other | Admitting: Gastroenterology

## 2020-07-03 ENCOUNTER — Other Ambulatory Visit: Payer: Self-pay | Admitting: Infectious Diseases

## 2020-07-03 DIAGNOSIS — B20 Human immunodeficiency virus [HIV] disease: Secondary | ICD-10-CM

## 2020-07-09 ENCOUNTER — Other Ambulatory Visit: Payer: Self-pay | Admitting: Internal Medicine

## 2020-07-09 DIAGNOSIS — B20 Human immunodeficiency virus [HIV] disease: Secondary | ICD-10-CM

## 2020-07-24 ENCOUNTER — Other Ambulatory Visit: Payer: Self-pay

## 2020-07-24 ENCOUNTER — Ambulatory Visit (INDEPENDENT_AMBULATORY_CARE_PROVIDER_SITE_OTHER): Payer: Medicare Other | Admitting: Internal Medicine

## 2020-07-24 ENCOUNTER — Encounter: Payer: Self-pay | Admitting: Internal Medicine

## 2020-07-24 VITALS — BP 126/82 | HR 97 | Temp 98.0°F | Wt 126.0 lb

## 2020-07-24 DIAGNOSIS — B20 Human immunodeficiency virus [HIV] disease: Secondary | ICD-10-CM

## 2020-07-24 DIAGNOSIS — Z5181 Encounter for therapeutic drug level monitoring: Secondary | ICD-10-CM

## 2020-07-24 DIAGNOSIS — A539 Syphilis, unspecified: Secondary | ICD-10-CM | POA: Diagnosis not present

## 2020-07-24 MED ORDER — TRIUMEQ 600-50-300 MG PO TABS: 1.0000 | ORAL_TABLET | Freq: Every day | ORAL | 11 refills | Status: DC

## 2020-07-24 NOTE — Assessment & Plan Note (Signed)
Creat, LFTs wnl.  

## 2020-07-24 NOTE — Assessment & Plan Note (Signed)
+   RPR up to 1:64 and now s/p treatment.  Will continue to monitor.

## 2020-07-24 NOTE — Progress Notes (Signed)
   Subjective:    Patient ID: Daniel House, male    DOB: 1975/12/15, 45 y.o.   MRN: [id number]  HPI Here for follow up of HIV He continues on Triumeq with no new issues.  No missed doses.  No problem getting, taking or tolerating his medication.  CD4 516 and viral load < 20.  No complaints.  Uses condoms with sexual activity.    Review of Systems  Constitutional:  Negative for fatigue.  Gastrointestinal:  Negative for diarrhea and nausea.  Skin:  Negative for rash.      Objective:   Physical Exam Eyes:     General: No scleral icterus. Cardiovascular:     Rate and Rhythm: Normal rate.  Pulmonary:     Effort: Pulmonary effort is normal.  Neurological:     General: No focal deficit present.     Mental Status: He is alert.  Psychiatric:        Mood and Affect: Mood normal.    SH + tobacco      Assessment & Plan:

## 2020-07-24 NOTE — Assessment & Plan Note (Signed)
He continues to do well, no issues with his ARVs and will continue.  rtc in 6 months.

## 2020-08-06 ENCOUNTER — Other Ambulatory Visit: Payer: Self-pay | Admitting: Internal Medicine

## 2020-08-06 DIAGNOSIS — B20 Human immunodeficiency virus [HIV] disease: Secondary | ICD-10-CM

## 2020-09-13 ENCOUNTER — Telehealth: Payer: Self-pay | Admitting: Gastroenterology

## 2020-09-13 NOTE — Telephone Encounter (Signed)
Sorry to hear this, thanks for letting me know.  He can reschedule at his convenience.

## 2020-09-13 NOTE — Telephone Encounter (Signed)
Hi Dr. Adela Lank, this patient just called to cancel procedure that was scheduled for tomorrow 09/14/20 because his ride just advised him that he will not be able to bring him to appt. Patient has no family members in the state so he will call back to reschedule. Thank you.

## 2020-09-14 ENCOUNTER — Encounter: Payer: Medicare Other | Admitting: Gastroenterology

## 2021-01-01 ENCOUNTER — Other Ambulatory Visit: Payer: Self-pay

## 2021-01-01 ENCOUNTER — Encounter (HOSPITAL_COMMUNITY): Payer: Self-pay

## 2021-01-01 ENCOUNTER — Emergency Department (HOSPITAL_COMMUNITY)
Admission: EM | Admit: 2021-01-01 | Discharge: 2021-01-02 | Disposition: A | Discharge status: Home / Self Care | Payer: Medicare Other | Attending: Emergency Medicine | Admitting: Emergency Medicine

## 2021-01-01 DIAGNOSIS — J9801 Acute bronchospasm: Secondary | ICD-10-CM | POA: Diagnosis not present

## 2021-01-01 DIAGNOSIS — Z20822 Contact with and (suspected) exposure to covid-19: Secondary | ICD-10-CM | POA: Diagnosis not present

## 2021-01-01 DIAGNOSIS — N189 Chronic kidney disease, unspecified: Secondary | ICD-10-CM | POA: Insufficient documentation

## 2021-01-01 DIAGNOSIS — Z21 Asymptomatic human immunodeficiency virus [HIV] infection status: Secondary | ICD-10-CM | POA: Diagnosis not present

## 2021-01-01 DIAGNOSIS — J209 Acute bronchitis, unspecified: Secondary | ICD-10-CM | POA: Diagnosis not present

## 2021-01-01 DIAGNOSIS — F17210 Nicotine dependence, cigarettes, uncomplicated: Secondary | ICD-10-CM | POA: Diagnosis not present

## 2021-01-01 DIAGNOSIS — R0602 Shortness of breath: Secondary | ICD-10-CM | POA: Diagnosis not present

## 2021-01-01 LAB — RESP PANEL BY RT-PCR (FLU A&B, COVID) ARPGX2
Influenza A by PCR: NEGATIVE
Influenza B by PCR: NEGATIVE
SARS Coronavirus 2 by RT PCR: NEGATIVE

## 2021-01-01 NOTE — ED Triage Notes (Signed)
Pt reports with cough, congestion, and shortness of breath x 3 days.

## 2021-01-02 ENCOUNTER — Emergency Department (HOSPITAL_COMMUNITY): Payer: Medicare Other

## 2021-01-02 DIAGNOSIS — J209 Acute bronchitis, unspecified: Secondary | ICD-10-CM | POA: Diagnosis not present

## 2021-01-02 DIAGNOSIS — R0602 Shortness of breath: Secondary | ICD-10-CM | POA: Diagnosis not present

## 2021-01-02 MED ORDER — ALBUTEROL SULFATE (2.5 MG/3ML) 0.083% IN NEBU
2.5000 mg | INHALATION_SOLUTION | Freq: Once | RESPIRATORY_TRACT | Status: AC
  Administered 2021-01-02: 03:00:00 2.5 mg via RESPIRATORY_TRACT
  Filled 2021-01-02: qty 3

## 2021-01-02 MED ORDER — DEXAMETHASONE SODIUM PHOSPHATE 10 MG/ML IJ SOLN
10.0000 mg | Freq: Once | INTRAMUSCULAR | Status: AC
  Administered 2021-01-02: 05:00:00 10 mg via INTRAMUSCULAR
  Filled 2021-01-02: qty 1

## 2021-01-02 MED ORDER — ALBUTEROL SULFATE (2.5 MG/3ML) 0.083% IN NEBU
2.5000 mg | INHALATION_SOLUTION | Freq: Once | RESPIRATORY_TRACT | Status: AC
  Administered 2021-01-02: 04:00:00 2.5 mg via RESPIRATORY_TRACT

## 2021-01-02 MED ORDER — IPRATROPIUM-ALBUTEROL 0.5-2.5 (3) MG/3ML IN SOLN
3.0000 mL | RESPIRATORY_TRACT | Status: DC
  Administered 2021-01-02: 03:00:00 3 mL via RESPIRATORY_TRACT
  Filled 2021-01-02: qty 3

## 2021-01-02 MED ORDER — AEROCHAMBER Z-STAT PLUS/MEDIUM MISC
1.0000 | Freq: Once | Status: AC
  Administered 2021-01-02: 05:00:00 1
  Filled 2021-01-02: qty 1

## 2021-01-02 MED ORDER — ALBUTEROL SULFATE HFA 108 (90 BASE) MCG/ACT IN AERS
2.0000 | INHALATION_SPRAY | RESPIRATORY_TRACT | Status: DC | PRN
  Administered 2021-01-02: 05:00:00 2 via RESPIRATORY_TRACT
  Filled 2021-01-02: qty 6.7

## 2021-01-02 NOTE — ED Provider Notes (Signed)
[facility]-EMERGENCY DEPT Provider Note: Lowella Dell, MD, FACEP  CSN: [id number] MRN: [id number] ARRIVAL: 01/01/21 at 1944 ROOM: WA08/WA08   CHIEF COMPLAINT  Cough and Shortness of Breath   HISTORY OF PRESENT ILLNESS  01/02/21 2:55 AM Daniel House is a 45 y.o. male with history of HIV and syphilis currently followed by infectious diseases.  He is here with 3 days of cough, wheezing and shortness of breath.  He is having pain in his chest when he coughs.  He rates this as a 5 out of 10, aching in nature.  His shortness of breath is moderate to severe, worse with exertion.  He has used an inhaler in the past but does not currently have 1.  He denies fever.   Past Medical History:  Diagnosis Date   Chronic kidney disease    Hematochezia 05/31/2011   HIV infection (HCC) 04/2011   PCP (pneumocystis jiroveci pneumonia) (HCC) 05/12/2011   Pneumonia 04/2011   PCP pneumonia    Seizures (HCC)     Past Surgical History:  Procedure Laterality Date   APPENDECTOMY  1996   ORIF WRIST FRACTURE Left 12/23/2014   Procedure: OPEN REDUCTION INTERNAL FIXATION (ORIF) WRIST FRACTURE;  Surgeon: Knute Neu, MD;  Location: [facility] OR;  Service: Plastics;  Laterality: Left;    Family History  Family history unknown: Yes    Social History   Tobacco Use   Smoking status: Every Day    Packs/day: 0.30    Years: 20.00    Pack years: 6.00    Types: Cigarettes   Smokeless tobacco: Never   Tobacco comments:    trying to quit  Vaping Use   Vaping Use: Never used  Substance Use Topics   Alcohol use: Yes    Alcohol/week: 0.0 standard drinks    Comment: 1-2 per day   Drug use: No    Comment: no drugs x 1 year    Prior to Admission medications   Medication Sig Start Date End Date Taking? Authorizing Provider  abacavir-dolutegravir-lamiVUDine (TRIUMEQ) 600-50-300 MG tablet Take 1 tablet by mouth daily. 07/24/20   Comer, Belia Heman, MD  polyethylene glycol powder (GLYCOLAX/MIRALAX) 17 GM/SCOOP powder Take 17  g by mouth 2 (two) times daily as needed. 04/09/20   Hoy Register, MD  shark liver oil-cocoa butter (PREPARATION H) 0.25-88.44 % suppository Place 1 suppository rectally as needed for hemorrhoids. 01/09/20   Gailen Shelter, PA    Allergies Bactrim [sulfamethoxazole-trimethoprim] and Truvada [emtricitabine-tenofovir df]   REVIEW OF SYSTEMS  Negative except as noted here or in the History of Present Illness.   PHYSICAL EXAMINATION  Initial Vital Signs Blood pressure (!) 155/104, pulse 89, temperature 99 F (37.2 C), temperature source Oral, resp. rate 18, height 6' (1.829 m), weight 61.2 kg, SpO2 99 %.  Examination General: Well-developed, thin male in no acute distress; appearance consistent with age of record HENT: normocephalic; atraumatic Eyes: Normal appearance Neck: supple Heart: regular rate and rhythm Lungs: Inspiratory and expiratory wheezing Abdomen: soft; nondistended; nontender; bowel sounds present Extremities: No deformity; full range of motion Neurologic: Awake, alert and oriented; motor function intact in all extremities and symmetric; no facial droop Skin: Warm and dry Psychiatric: Normal mood and affect   RESULTS  Summary of this visit's results, reviewed and interpreted by myself:   EKG Interpretation  Date/Time:  Tuesday January 01 2021 20:21:59 EST Ventricular Rate:  84 PR Interval:  158 QRS Duration: 85 QT Interval:  359 QTC Calculation: 425 R Axis:  83 Text Interpretation: Sinus rhythm Biatrial enlargement RSR' in V1 or V2, probably normal variant Abnrm T, consider ischemia, anterolateral lds ST elev, probable normal early repol pattern No significant change was found Confirmed by Paula Libra ([id number]) on 01/02/2021 2:55:34 AM       Laboratory Studies: Results for orders placed or performed during the hospital encounter of 01/01/21 (from the past 24 hour(s))  Resp Panel by RT-PCR (Flu A&B, Covid) Nasopharyngeal Swab     Status: None    Collection Time: 01/01/21  8:26 PM   Specimen: Nasopharyngeal Swab; Nasopharyngeal(NP) swabs in vial transport medium  Result Value Ref Range   SARS Coronavirus 2 by RT PCR NEGATIVE NEGATIVE   Influenza A by PCR NEGATIVE NEGATIVE   Influenza B by PCR NEGATIVE NEGATIVE   Imaging Studies: DG Chest 2 View  Result Date: 01/02/2021 CLINICAL DATA:  Shortness of breath EXAM: CHEST - 2 VIEW COMPARISON:  10/31/2017 FINDINGS: Lungs are clear.  No pleural effusion or pneumothorax. The heart is normal in size. Visualized osseous structures are within normal limits. IMPRESSION: Normal chest radiographs. Electronically Signed   By: Charline Bills M.D.   On: 01/02/2021 03:17    ED COURSE and MDM  Nursing notes, initial and subsequent vitals signs, including pulse oximetry, reviewed and interpreted by myself.  Vitals:   01/01/21 2017 01/02/21 0308 01/02/21 0315 01/02/21 0330  BP: (!) 155/104 (!) 160/111 (!) 162/103 (!) 172/103  Pulse: 89 80    Resp: 18 (!) 22 (!) 24 (!) 22  Temp: 99 F (37.2 C)     TempSrc: Oral     SpO2: 99% 96%  95%  Weight: 61.2 kg     Height: 6' (1.829 m)      Medications  dexamethasone (DECADRON) injection 10 mg (has no administration in time range)  albuterol (VENTOLIN HFA) 108 (90 Base) MCG/ACT inhaler 2 puff (has no administration in time range)  aerochamber plus with mask device 1 each (has no administration in time range)  albuterol (PROVENTIL) (2.5 MG/3ML) 0.083% nebulizer solution 2.5 mg (2.5 mg Nebulization Given 01/02/21 0327)  albuterol (PROVENTIL) (2.5 MG/3ML) 0.083% nebulizer solution 2.5 mg (2.5 mg Nebulization Given 01/02/21 0402)   3:57 AM Air movement improved, wheezing diminished after initial neb treatment.  4:52 AM Lungs clear after second neb treatment.  We will provide patient with an albuterol inhaler and AeroChamber.  Chest x-Daniel showed without infiltrate.  Likely viral etiology but influenza and COVID are negative.   PROCEDURES   Procedures   ED DIAGNOSES     ICD-10-CM   1. Acute bronchitis with bronchospasm  J20.9          Armie Moren, Jonny Ruiz, MD 01/02/21 [phone]

## 2021-01-24 ENCOUNTER — Other Ambulatory Visit: Payer: Medicare Other

## 2021-01-28 ENCOUNTER — Other Ambulatory Visit: Payer: Self-pay

## 2021-01-28 ENCOUNTER — Other Ambulatory Visit: Payer: Medicare Other

## 2021-01-28 DIAGNOSIS — B20 Human immunodeficiency virus [HIV] disease: Secondary | ICD-10-CM | POA: Diagnosis not present

## 2021-01-29 LAB — T-HELPER CELL (CD4) - (RCID CLINIC ONLY)
CD4 % Helper T Cell: 26 % — ABNORMAL LOW (ref 33–65)
CD4 T Cell Abs: 375 /uL — ABNORMAL LOW (ref 400–1790)

## 2021-01-30 LAB — HIV-1 RNA QUANT-NO REFLEX-BLD
HIV 1 RNA Quant: 42 Copies/mL — ABNORMAL HIGH
HIV-1 RNA Quant, Log: 1.63 Log cps/mL — ABNORMAL HIGH

## 2021-02-08 ENCOUNTER — Encounter: Payer: Self-pay | Admitting: Internal Medicine

## 2021-02-08 ENCOUNTER — Ambulatory Visit (INDEPENDENT_AMBULATORY_CARE_PROVIDER_SITE_OTHER): Payer: Medicare Other | Admitting: Internal Medicine

## 2021-02-08 ENCOUNTER — Other Ambulatory Visit: Payer: Self-pay

## 2021-02-08 DIAGNOSIS — Z113 Encounter for screening for infections with a predominantly sexual mode of transmission: Secondary | ICD-10-CM

## 2021-02-08 DIAGNOSIS — Z79899 Other long term (current) drug therapy: Secondary | ICD-10-CM

## 2021-02-08 DIAGNOSIS — Z720 Tobacco use: Secondary | ICD-10-CM | POA: Diagnosis not present

## 2021-02-08 DIAGNOSIS — B20 Human immunodeficiency virus [HIV] disease: Secondary | ICD-10-CM

## 2021-02-08 DIAGNOSIS — J9801 Acute bronchospasm: Secondary | ICD-10-CM | POA: Diagnosis not present

## 2021-02-08 NOTE — Assessment & Plan Note (Signed)
I discussed smoking cessation, particularly in response to recent breathing issues.  He is contemplative and wants to quit.

## 2021-02-08 NOTE — Assessment & Plan Note (Signed)
He continues to do well, though CD4 down some but compliance has not been an issue.  I suspect related to recent illness and steroids.   rtc in 6 months

## 2021-02-08 NOTE — Progress Notes (Signed)
° °  Subjective:    Patient ID: Daniel House, male    DOB: [birth year], 46 y.o.   MRN: [id number]  I connected with  Daniel House on 02/08/21 by a video enabled telemedicine application and verified that I am speaking with the correct person using two identifiers.   I discussed the limitations of evaluation and management by telemedicine. The patient expressed understanding and agreed to proceed.  Location: Patient - home Physician - clinic  Duration of visit:  15 minutes  HPI Called for follow up of HIV He continues on Triumeq and denies any missed doses.   CD4 375 and viral load 42 copies.  No issues with getting, taking or tolerating the medication.  He was in the ED for wheezing in December and now has an inhaler.  Feels better now.     Review of Systems  Constitutional:  Negative for fatigue.  Gastrointestinal:  Negative for diarrhea and nausea.  Skin:  Negative for rash.      Objective:   Physical Exam Neurological:     Mental Status: He is alert.  Psychiatric:        Mood and Affect: Mood normal.          Assessment & Plan:

## 2021-02-08 NOTE — Assessment & Plan Note (Signed)
Recent issues with acute bronchitis with bronchospasm.  He will follow up with his PCP to see if he needs further evaluation or treatment.

## 2021-05-28 DIAGNOSIS — Z23 Encounter for immunization: Secondary | ICD-10-CM | POA: Diagnosis not present

## 2021-07-07 ENCOUNTER — Other Ambulatory Visit: Payer: Self-pay | Admitting: Internal Medicine

## 2021-07-07 DIAGNOSIS — B20 Human immunodeficiency virus [HIV] disease: Secondary | ICD-10-CM

## 2021-10-15 ENCOUNTER — Ambulatory Visit: Payer: Medicare Other | Admitting: Internal Medicine

## 2021-10-23 ENCOUNTER — Ambulatory Visit: Payer: Medicare Other | Admitting: Internal Medicine

## 2021-11-11 ENCOUNTER — Other Ambulatory Visit (HOSPITAL_COMMUNITY)
Admission: RE | Admit: 2021-11-11 | Discharge: 2021-11-11 | Disposition: A | Discharge status: Home / Self Care | Payer: Medicare Other | Source: Ambulatory Visit | Attending: Internal Medicine | Admitting: Internal Medicine

## 2021-11-11 ENCOUNTER — Encounter: Payer: Self-pay | Admitting: Internal Medicine

## 2021-11-11 ENCOUNTER — Ambulatory Visit (INDEPENDENT_AMBULATORY_CARE_PROVIDER_SITE_OTHER): Payer: Medicare Other | Admitting: Internal Medicine

## 2021-11-11 ENCOUNTER — Other Ambulatory Visit: Payer: Self-pay

## 2021-11-11 VITALS — BP 143/90 | HR 89 | Temp 98.1°F | Ht 72.0 in

## 2021-11-11 DIAGNOSIS — B20 Human immunodeficiency virus [HIV] disease: Secondary | ICD-10-CM | POA: Insufficient documentation

## 2021-11-11 DIAGNOSIS — Z113 Encounter for screening for infections with a predominantly sexual mode of transmission: Secondary | ICD-10-CM | POA: Diagnosis not present

## 2021-11-11 DIAGNOSIS — Z720 Tobacco use: Secondary | ICD-10-CM

## 2021-11-11 DIAGNOSIS — Z23 Encounter for immunization: Secondary | ICD-10-CM

## 2021-11-11 DIAGNOSIS — Z79899 Other long term (current) drug therapy: Secondary | ICD-10-CM

## 2021-11-11 MED ORDER — TRIUMEQ 600-50-300 MG PO TABS: 1.0000 | ORAL_TABLET | Freq: Every day | ORAL | 11 refills | Status: DC

## 2021-11-11 NOTE — Progress Notes (Signed)
   Subjective:    Patient ID: Daniel House, male    DOB: 05/04/1975, 46 y.o.   MRN: [id number]  HPI Here for follow up of HIV He continues on Triumeq and denies any issues.  No labs prior to the visit.  No complaints today.  No missed doses or issues getting the medication.    Review of Systems  Constitutional:  Negative for fatigue.  Gastrointestinal:  Negative for diarrhea.  Skin:  Negative for rash.       Objective:   Physical Exam Eyes:     General: No scleral icterus. Pulmonary:     Effort: Pulmonary effort is normal.  Neurological:     Mental Status: He is alert.    SH: + tobacco       Assessment & Plan:

## 2021-11-11 NOTE — Assessment & Plan Note (Signed)
I discussed complete cessation.  He is vaping some for replacement.  I recommended not to replace with vaping and continue cessation efforts.

## 2021-11-11 NOTE — Assessment & Plan Note (Signed)
Will check his lipid panel.  I have encouraged him to get back to his pcp, consider colon cancer screening

## 2021-11-11 NOTE — Assessment & Plan Note (Signed)
Will screen today 

## 2021-11-11 NOTE — Assessment & Plan Note (Signed)
He continues to do well on Triumeq.  Last labs reviewed with him.  Labs today, refills sent. No changes and rtc in 6 months.

## 2021-11-12 LAB — CYTOLOGY, (ORAL, ANAL, URETHRAL) ANCILLARY ONLY
Chlamydia: NEGATIVE
Chlamydia: NEGATIVE
Comment: NEGATIVE
Comment: NEGATIVE
Comment: NORMAL
Comment: NORMAL
Neisseria Gonorrhea: NEGATIVE
Neisseria Gonorrhea: NEGATIVE

## 2021-11-12 LAB — T-HELPER CELL (CD4) - (RCID CLINIC ONLY)
CD4 % Helper T Cell: 32 % — ABNORMAL LOW (ref 33–65)
CD4 T Cell Abs: 588 /uL (ref 400–1790)

## 2021-11-12 LAB — URINE CYTOLOGY ANCILLARY ONLY
Chlamydia: NEGATIVE
Comment: NEGATIVE
Comment: NORMAL
Neisseria Gonorrhea: NEGATIVE

## 2021-11-14 LAB — CBC WITH DIFFERENTIAL/PLATELET
Absolute Monocytes: 443 cells/uL (ref 200–950)
Basophils Absolute: 60 cells/uL (ref 0–200)
Basophils Relative: 1.4 %
Eosinophils Absolute: 271 cells/uL (ref 15–500)
Eosinophils Relative: 6.3 %
HCT: 39.1 % (ref 38.5–50.0)
Hemoglobin: 13.6 g/dL (ref 13.2–17.1)
Lymphs Abs: 1978 cells/uL (ref 850–3900)
MCH: 36 pg — ABNORMAL HIGH (ref 27.0–33.0)
MCHC: 34.8 g/dL (ref 32.0–36.0)
MCV: 103.4 fL — ABNORMAL HIGH (ref 80.0–100.0)
MPV: 10.9 fL (ref 7.5–12.5)
Monocytes Relative: 10.3 %
Neutro Abs: 1548 cells/uL (ref 1500–7800)
Neutrophils Relative %: 36 %
Platelets: 231 10*3/uL (ref 140–400)
RBC: 3.78 10*6/uL — ABNORMAL LOW (ref 4.20–5.80)
RDW: 12.1 % (ref 11.0–15.0)
Total Lymphocyte: 46 %
WBC: 4.3 10*3/uL (ref 3.8–10.8)

## 2021-11-14 LAB — LIPID PANEL
Cholesterol: 135 mg/dL (ref <200)
HDL: 70 mg/dL (ref >=40)
LDL Cholesterol (Calc): 53 mg/dL (calc)
Non-HDL Cholesterol (Calc): 65 mg/dL (calc) (ref <130)
Total CHOL/HDL Ratio: 1.9 (calc) (ref <5.0)
Triglycerides: 43 mg/dL (ref <150)

## 2021-11-14 LAB — COMPLETE METABOLIC PANEL WITH GFR
AG Ratio: 1.3 (calc) (ref 1.0–2.5)
ALT: 13 U/L (ref 9–46)
AST: 22 U/L (ref 10–40)
Albumin: 4.5 g/dL (ref 3.6–5.1)
Alkaline phosphatase (APISO): 95 U/L (ref 36–130)
BUN/Creatinine Ratio: 13 (calc) (ref 6–22)
BUN: 17 mg/dL (ref 7–25)
CO2: 27 mmol/L (ref 20–32)
Calcium: 9.5 mg/dL (ref 8.6–10.3)
Chloride: 105 mmol/L (ref 98–110)
Creat: 1.33 mg/dL — ABNORMAL HIGH (ref 0.60–1.29)
Globulin: 3.6 g/dL (calc) (ref 1.9–3.7)
Glucose, Bld: 94 mg/dL (ref 65–99)
Potassium: 4.3 mmol/L (ref 3.5–5.3)
Sodium: 139 mmol/L (ref 135–146)
Total Bilirubin: 0.3 mg/dL (ref 0.2–1.2)
Total Protein: 8.1 g/dL (ref 6.1–8.1)
eGFR: 67 mL/min/{1.73_m2} (ref >=60)

## 2021-11-14 LAB — HIV-1 RNA QUANT-NO REFLEX-BLD
HIV 1 RNA Quant: <20 Copies/mL — ABNORMAL HIGH
HIV-1 RNA Quant, Log: <1.3 Log cps/mL — ABNORMAL HIGH

## 2021-11-14 LAB — FLUORESCENT TREPONEMAL AB(FTA)-IGG-BLD: Fluorescent Treponemal ABS: REACTIVE — AB

## 2021-11-14 LAB — RPR: RPR Ser Ql: REACTIVE — AB

## 2021-11-14 LAB — RPR TITER: RPR Titer: 1:4 {titer} — ABNORMAL HIGH

## 2022-02-17 ENCOUNTER — Other Ambulatory Visit: Payer: Self-pay | Admitting: Internal Medicine

## 2022-02-17 DIAGNOSIS — B20 Human immunodeficiency virus [HIV] disease: Secondary | ICD-10-CM

## 2022-10-21 ENCOUNTER — Ambulatory Visit: Payer: 59 | Admitting: Internal Medicine

## 2022-10-24 ENCOUNTER — Ambulatory Visit: Payer: 59 | Admitting: Internal Medicine

## 2022-11-23 ENCOUNTER — Other Ambulatory Visit: Payer: Self-pay | Admitting: Internal Medicine

## 2022-11-23 DIAGNOSIS — B20 Human immunodeficiency virus [HIV] disease: Secondary | ICD-10-CM

## 2022-11-24 ENCOUNTER — Other Ambulatory Visit: Payer: Self-pay

## 2022-11-24 DIAGNOSIS — Z113 Encounter for screening for infections with a predominantly sexual mode of transmission: Secondary | ICD-10-CM

## 2022-11-24 DIAGNOSIS — Z79899 Other long term (current) drug therapy: Secondary | ICD-10-CM

## 2022-11-24 DIAGNOSIS — B20 Human immunodeficiency virus [HIV] disease: Secondary | ICD-10-CM

## 2022-11-26 ENCOUNTER — Other Ambulatory Visit: Payer: Self-pay | Admitting: Internal Medicine

## 2022-11-26 ENCOUNTER — Other Ambulatory Visit (HOSPITAL_COMMUNITY): Payer: Self-pay

## 2022-11-26 ENCOUNTER — Other Ambulatory Visit: Payer: Self-pay

## 2022-11-26 DIAGNOSIS — B20 Human immunodeficiency virus [HIV] disease: Secondary | ICD-10-CM

## 2022-11-26 MED ORDER — TRIUMEQ 600-50-300 MG PO TABS: 1.0000 | ORAL_TABLET | Freq: Every day | ORAL | 0 refills | Status: DC

## 2022-12-03 ENCOUNTER — Other Ambulatory Visit: Payer: Self-pay

## 2022-12-03 ENCOUNTER — Other Ambulatory Visit (HOSPITAL_COMMUNITY)
Admission: RE | Admit: 2022-12-03 | Discharge: 2022-12-03 | Disposition: A | Discharge status: Home / Self Care | Payer: 59 | Source: Ambulatory Visit | Attending: Internal Medicine | Admitting: Internal Medicine

## 2022-12-03 ENCOUNTER — Other Ambulatory Visit: Payer: 59

## 2022-12-03 DIAGNOSIS — B20 Human immunodeficiency virus [HIV] disease: Secondary | ICD-10-CM | POA: Insufficient documentation

## 2022-12-03 DIAGNOSIS — Z113 Encounter for screening for infections with a predominantly sexual mode of transmission: Secondary | ICD-10-CM

## 2022-12-03 DIAGNOSIS — Z79899 Other long term (current) drug therapy: Secondary | ICD-10-CM | POA: Insufficient documentation

## 2022-12-04 LAB — URINE CYTOLOGY ANCILLARY ONLY
Chlamydia: NEGATIVE
Comment: NEGATIVE
Comment: NORMAL
Neisseria Gonorrhea: NEGATIVE

## 2022-12-04 LAB — T-HELPER CELL (CD4) - (RCID CLINIC ONLY)
CD4 % Helper T Cell: 37 % (ref 33–65)
CD4 T Cell Abs: 656 /uL (ref 400–1790)

## 2022-12-08 ENCOUNTER — Encounter: Payer: 59 | Admitting: Family Medicine

## 2022-12-09 LAB — LIPID PANEL
Cholesterol: 152 mg/dL (ref <200)
HDL: 60 mg/dL (ref >=40)
LDL Cholesterol (Calc): 73 mg/dL
Non-HDL Cholesterol (Calc): 92 mg/dL (ref <130)
Total CHOL/HDL Ratio: 2.5 (calc) (ref <5.0)
Triglycerides: 103 mg/dL (ref <150)

## 2022-12-09 LAB — CBC WITH DIFFERENTIAL/PLATELET
Absolute Lymphocytes: 2030 {cells}/uL (ref 850–3900)
Absolute Monocytes: 470 {cells}/uL (ref 200–950)
Basophils Absolute: 52 {cells}/uL (ref 0–200)
Basophils Relative: 1.1 %
Eosinophils Absolute: 451 {cells}/uL (ref 15–500)
Eosinophils Relative: 9.6 %
HCT: 38.1 % — ABNORMAL LOW (ref 38.5–50.0)
Hemoglobin: 13.3 g/dL (ref 13.2–17.1)
MCH: 35.2 pg — ABNORMAL HIGH (ref 27.0–33.0)
MCHC: 34.9 g/dL (ref 32.0–36.0)
MCV: 100.8 fL — ABNORMAL HIGH (ref 80.0–100.0)
MPV: 11.3 fL (ref 7.5–12.5)
Monocytes Relative: 10 %
Neutro Abs: 1697 {cells}/uL (ref 1500–7800)
Neutrophils Relative %: 36.1 %
Platelets: 214 10*3/uL (ref 140–400)
RBC: 3.78 10*6/uL — ABNORMAL LOW (ref 4.20–5.80)
RDW: 12.7 % (ref 11.0–15.0)
Total Lymphocyte: 43.2 %
WBC: 4.7 10*3/uL (ref 3.8–10.8)

## 2022-12-09 LAB — COMPLETE METABOLIC PANEL WITH GFR
AG Ratio: 1.5 (calc) (ref 1.0–2.5)
ALT: 19 U/L (ref 9–46)
AST: 24 U/L (ref 10–40)
Albumin: 4.4 g/dL (ref 3.6–5.1)
Alkaline phosphatase (APISO): 90 U/L (ref 36–130)
BUN/Creatinine Ratio: 15 (calc) (ref 6–22)
BUN: 20 mg/dL (ref 7–25)
CO2: 27 mmol/L (ref 20–32)
Calcium: 9.8 mg/dL (ref 8.6–10.3)
Chloride: 103 mmol/L (ref 98–110)
Creat: 1.34 mg/dL — ABNORMAL HIGH (ref 0.60–1.29)
Globulin: 2.9 g/dL (ref 1.9–3.7)
Glucose, Bld: 94 mg/dL (ref 65–99)
Potassium: 4.1 mmol/L (ref 3.5–5.3)
Sodium: 137 mmol/L (ref 135–146)
Total Bilirubin: 0.3 mg/dL (ref 0.2–1.2)
Total Protein: 7.3 g/dL (ref 6.1–8.1)
eGFR: 66 mL/min/{1.73_m2} (ref >=60)

## 2022-12-09 LAB — T PALLIDUM AB: T Pallidum Abs: POSITIVE — AB

## 2022-12-09 LAB — HIV-1 RNA QUANT-NO REFLEX-BLD
HIV 1 RNA Quant: <20 {copies}/mL — ABNORMAL HIGH
HIV-1 RNA Quant, Log: <1.3 {Log_copies}/mL — ABNORMAL HIGH

## 2022-12-09 LAB — RPR TITER: RPR Titer: 1:2 {titer} — ABNORMAL HIGH

## 2022-12-09 LAB — RPR: RPR Ser Ql: REACTIVE — AB

## 2022-12-17 ENCOUNTER — Other Ambulatory Visit: Payer: Self-pay

## 2022-12-17 ENCOUNTER — Ambulatory Visit (INDEPENDENT_AMBULATORY_CARE_PROVIDER_SITE_OTHER): Payer: 59 | Admitting: Internal Medicine

## 2022-12-17 ENCOUNTER — Encounter: Payer: Self-pay | Admitting: Internal Medicine

## 2022-12-17 ENCOUNTER — Other Ambulatory Visit (HOSPITAL_COMMUNITY)
Admission: RE | Admit: 2022-12-17 | Discharge: 2022-12-17 | Disposition: A | Discharge status: Home / Self Care | Payer: 59 | Source: Ambulatory Visit | Attending: Internal Medicine | Admitting: Internal Medicine

## 2022-12-17 VITALS — BP 120/86 | HR 87 | Temp 97.6°F | Ht 72.0 in | Wt 136.0 lb

## 2022-12-17 DIAGNOSIS — B20 Human immunodeficiency virus [HIV] disease: Secondary | ICD-10-CM | POA: Diagnosis not present

## 2022-12-17 DIAGNOSIS — Z720 Tobacco use: Secondary | ICD-10-CM

## 2022-12-17 DIAGNOSIS — Z113 Encounter for screening for infections with a predominantly sexual mode of transmission: Secondary | ICD-10-CM

## 2022-12-17 DIAGNOSIS — Z23 Encounter for immunization: Secondary | ICD-10-CM | POA: Diagnosis not present

## 2022-12-17 DIAGNOSIS — Z79899 Other long term (current) drug therapy: Secondary | ICD-10-CM | POA: Diagnosis not present

## 2022-12-17 MED ORDER — TRIUMEQ 600-50-300 MG PO TABS: 1.0000 | ORAL_TABLET | Freq: Every day | ORAL | 11 refills | Status: DC

## 2022-12-17 NOTE — Progress Notes (Signed)
   Subjective:    Patient ID: Daniel House, male    DOB: 25-Nov-1975, 47 y.o.   MRN: [id number]  HPI Daniel House is here for follow up of HIV He continues on Triumeq with no missed doses.  No issues with getting or taking his medication.  Continues to follow with his PCP.  No complaints today.     Review of Systems  Constitutional:  Negative for fatigue.  Gastrointestinal:  Negative for diarrhea.  Skin:  Negative for rash.       Objective:   Physical Exam Eyes:     General: No scleral icterus. Pulmonary:     Effort: Pulmonary effort is normal.  Neurological:     Mental Status: He is alert.    SH: + tobacco       Assessment & Plan:

## 2022-12-17 NOTE — Assessment & Plan Note (Addendum)
Screened negative Anal pap today

## 2022-12-17 NOTE — Assessment & Plan Note (Signed)
He continues to do well, no changes indicated and he can follow up in 11 months.  Labs reviewed with him

## 2022-12-17 NOTE — Assessment & Plan Note (Signed)
Lipid panel reviewed with him

## 2022-12-17 NOTE — Assessment & Plan Note (Signed)
Discussed cessation 

## 2022-12-19 LAB — CYTOLOGY, (ORAL, ANAL, URETHRAL) ANCILLARY ONLY
Chlamydia: NEGATIVE
Chlamydia: NEGATIVE
Comment: NEGATIVE
Comment: NEGATIVE
Comment: NORMAL
Comment: NORMAL
Neisseria Gonorrhea: NEGATIVE
Neisseria Gonorrhea: NEGATIVE

## 2022-12-24 LAB — CYTOLOGY - PAP: Diagnosis: NEGATIVE

## 2023-01-19 ENCOUNTER — Encounter: Payer: 59 | Admitting: Family Medicine

## 2023-05-22 ENCOUNTER — Encounter: Payer: Self-pay | Admitting: Neurology

## 2023-06-16 LAB — COLOGUARD: COLOGUARD: NEGATIVE

## 2023-06-16 NOTE — Progress Notes (Signed)
 The 10-year ASCVD risk score (Arnett DK, et al., 2019) is: 6.4%   Values used to calculate the score:     Age: 48 years     Sex: Male     Is Non-Hispanic African American: Yes     Diabetic: No     Tobacco smoker: Yes     Systolic Blood Pressure: 120 mmHg     Is BP treated: No     HDL Cholesterol: 60 mg/dL     Total Cholesterol: 152 mg/dL  No current statin therapy, next appointment note updated.   Audriella Blakeley, BSN, RN

## 2023-07-31 ENCOUNTER — Ambulatory Visit: Admitting: Neurology

## 2023-11-17 ENCOUNTER — Ambulatory Visit: Payer: 59 | Admitting: Internal Medicine

## 2023-11-18 ENCOUNTER — Ambulatory Visit: Admitting: Internal Medicine

## 2023-11-18 ENCOUNTER — Other Ambulatory Visit: Payer: Self-pay

## 2023-11-18 ENCOUNTER — Other Ambulatory Visit (HOSPITAL_COMMUNITY)
Admission: RE | Admit: 2023-11-18 | Discharge: 2023-11-18 | Disposition: A | Discharge status: Home / Self Care | Source: Ambulatory Visit | Attending: Internal Medicine | Admitting: Internal Medicine

## 2023-11-18 ENCOUNTER — Encounter: Payer: Self-pay | Admitting: Internal Medicine

## 2023-11-18 VITALS — BP 143/92 | HR 79 | Temp 98.9°F | Ht 72.0 in | Wt 135.0 lb

## 2023-11-18 DIAGNOSIS — B20 Human immunodeficiency virus [HIV] disease: Secondary | ICD-10-CM | POA: Insufficient documentation

## 2023-11-18 DIAGNOSIS — Z21 Asymptomatic human immunodeficiency virus [HIV] infection status: Secondary | ICD-10-CM

## 2023-11-18 DIAGNOSIS — Z23 Encounter for immunization: Secondary | ICD-10-CM

## 2023-11-18 MED ORDER — ATORVASTATIN CALCIUM 10 MG PO TABS: 10.0000 mg | ORAL_TABLET | Freq: Every day | ORAL | 5 refills | Status: AC

## 2023-11-18 MED ORDER — TRIUMEQ 600-50-300 MG PO TABS: 1.0000 | ORAL_TABLET | Freq: Every day | ORAL | 11 refills | Status: DC

## 2023-11-18 NOTE — Patient Instructions (Signed)
 Smoking Cessation: QuitlineNC 1-800-QUIT-NOW 670-772-4699); Espaol: 1-855-Djelo-Ya (1-737-271-8849) http://carroll-castaneda.info/

## 2023-11-18 NOTE — Progress Notes (Signed)
 [facility]    HPI: Daniel House is a 48 y.o. male presents for HIV management. No missed doses of triumeq . Sexually active since LV  Past Medical History:  Diagnosis Date   Chronic kidney disease    Hematochezia 05/31/2011   HIV infection (HCC) 04/2011   PCP (pneumocystis jiroveci pneumonia) (HCC) 05/12/2011   Pneumonia 04/2011   PCP pneumonia    Seizures (HCC)     Past Surgical History:  Procedure Laterality Date   APPENDECTOMY  1996   ORIF WRIST FRACTURE Left 12/23/2014   Procedure: OPEN REDUCTION INTERNAL FIXATION (ORIF) WRIST FRACTURE;  Surgeon: Balinda Rogue, MD;  Location: [facility] OR;  Service: Plastics;  Laterality: Left;    Family History  Family history unknown: Yes   Current Outpatient Medications on File Prior to Visit  Medication Sig Dispense Refill   abacavir -dolutegravir -lamiVUDine  (TRIUMEQ ) 600-50-300 MG tablet TAKE 1 TABLET BY MOUTH DAILY 30 tablet 11   Aspirin-Salicylamide-Caffeine (BC HEADACHE POWDER PO) Take by mouth.     albuterol  (VENTOLIN  HFA) 108 (90 Base) MCG/ACT inhaler Inhale into the lungs every 6 (six) hours as needed for wheezing or shortness of breath. (Patient not taking: Reported on 11/18/2023)     No current facility-administered medications on file prior to visit.    Allergies  Allergen Reactions   Bactrim  [Sulfamethoxazole -Trimethoprim ] Other (See Comments)    Renal Failure   Truvada  [Emtricitabine -Tenofovir  Df] Other (See Comments)    Renal Failure      Lab Results HIV 1 RNA Quant (Copies/mL)  Date Value  12/03/2022 <20 (H)  11/11/2021 <20 (H)  01/28/2021 42 (H)   CD4 T Cell Abs (/uL)  Date Value  12/03/2022 656  11/11/2021 588  01/28/2021 375 (L)   No results found for: HIV1GENOSEQ Lab Results  Component Value Date   WBC 4.7 12/03/2022   HGB 13.3 12/03/2022   HCT 38.1 (L) 12/03/2022   MCV 100.8 (H) 12/03/2022   PLT 214 12/03/2022    Lab Results  Component Value Date   CREATININE 1.34  (H) 12/03/2022   BUN 20 12/03/2022   NA 137 12/03/2022   K 4.1 12/03/2022   CL 103 12/03/2022   CO2 27 12/03/2022   Lab Results  Component Value Date   ALT 19 12/03/2022   AST 24 12/03/2022   ALKPHOS 101 01/09/2020   BILITOT 0.3 12/03/2022    Lab Results  Component Value Date   CHOL 152 12/03/2022   TRIG 103 12/03/2022   HDL 60 12/03/2022   LDLCALC 73 12/03/2022   Lab Results  Component Value Date   HAV POS (A) 05/22/2011   Lab Results  Component Value Date   HEPBSAG NEGATIVE 05/02/2011   HEPBSAB POS (A) 05/22/2011   Lab Results  Component Value Date   HCVAB Reactive (A) 05/02/2011   Lab Results  Component Value Date   CHLAMYDIAWP Negative 12/17/2022   CHLAMYDIAWP Negative 12/17/2022   N Negative 12/17/2022   N Negative 12/17/2022   No results found for: GCPROBEAPT No results found for: QUANTGOLD  Assessment/Plan #HIV/Asymptomatic -Continue Triumeq  -HIV labs -F/U in one  month as starting statin for cmp and tolerance  #STI testing -gc three point -rpr    #Vaccination COVID Flu 10/19/23 Monkeypox PCV 23 in 2021 Meningitis utd HepA HEpB Tdap 2016 Shingles  #Health maintenance -Quantiferon -RPR todya -HCV today -GC today -Lipid The 10-year ASCVD risk score (Arnett DK, et al., 2019) is: 10.2%   Values used  to calculate the score:     Age: 23 years     Clincally relevant sex: Male     Is Non-Hispanic African American: Yes     Diabetic: No     Tobacco smoker: Yes     Systolic Blood Pressure: 157 mmHg     Is BP treated: No     HDL Cholesterol: 60 mg/dL     Total Cholesterol: 150 mg/dL  -Dysplasia screen M -Colonoscopy    [name], MD [facility] I have personally spent 35 minutes involved in face-to-face and non-face-to-face activities for this patient on the day of the visit. Professional time spent includes the following activities: Preparing to see the patient (review  of tests), Obtaining and/or reviewing separately obtained history (admission/discharge record), Performing a medically appropriate examination and/or evaluation , Ordering medications/tests/procedures, referring and communicating with other health care professionals, Documenting clinical information in the EMR, Independently interpreting results (not separately reported), Communicating results to the patient/family/caregiver, Counseling and educating the patient/family/caregiver and Care coordination (not separately reported).

## 2023-11-19 LAB — T-HELPER CELLS (CD4) COUNT (NOT AT ARMC)
CD4 % Helper T Cell: 35 % (ref 33–65)
CD4 T Cell Abs: 586 /uL (ref 400–1790)

## 2023-11-20 LAB — COMPLETE METABOLIC PANEL WITHOUT GFR
AG Ratio: 1.7 (calc) (ref 1.0–2.5)
ALT: 17 U/L (ref 9–46)
AST: 20 U/L (ref 10–40)
Albumin: 4.7 g/dL (ref 3.6–5.1)
Alkaline phosphatase (APISO): 100 U/L (ref 36–130)
BUN: 13 mg/dL (ref 7–25)
CO2: 28 mmol/L (ref 20–32)
Calcium: 9.5 mg/dL (ref 8.6–10.3)
Chloride: 104 mmol/L (ref 98–110)
Creat: 1.29 mg/dL (ref 0.60–1.29)
Globulin: 2.7 g/dL (ref 1.9–3.7)
Glucose, Bld: 93 mg/dL (ref 65–99)
Potassium: 4.1 mmol/L (ref 3.5–5.3)
Sodium: 138 mmol/L (ref 135–146)
Total Bilirubin: 0.6 mg/dL (ref 0.2–1.2)
Total Protein: 7.4 g/dL (ref 6.1–8.1)

## 2023-11-20 LAB — URINE CYTOLOGY ANCILLARY ONLY
Chlamydia: NEGATIVE
Comment: NEGATIVE
Comment: NORMAL
Neisseria Gonorrhea: NEGATIVE

## 2023-11-20 LAB — CBC WITH DIFFERENTIAL/PLATELET
Absolute Lymphocytes: 1792 {cells}/uL (ref 850–3900)
Absolute Monocytes: 385 {cells}/uL (ref 200–950)
Basophils Absolute: 41 {cells}/uL (ref 0–200)
Basophils Relative: 1 %
Eosinophils Absolute: 328 {cells}/uL (ref 15–500)
Eosinophils Relative: 8 %
HCT: 41.9 % (ref 38.5–50.0)
Hemoglobin: 14.2 g/dL (ref 13.2–17.1)
MCH: 34.3 pg — ABNORMAL HIGH (ref 27.0–33.0)
MCHC: 33.9 g/dL (ref 32.0–36.0)
MCV: 101.2 fL — ABNORMAL HIGH (ref 80.0–100.0)
MPV: 11.2 fL (ref 7.5–12.5)
Monocytes Relative: 9.4 %
Neutro Abs: 1554 {cells}/uL (ref 1500–7800)
Neutrophils Relative %: 37.9 %
Platelets: 228 Thousand/uL (ref 140–400)
RBC: 4.14 Million/uL — ABNORMAL LOW (ref 4.20–5.80)
RDW: 11.8 % (ref 11.0–15.0)
Total Lymphocyte: 43.7 %
WBC: 4.1 Thousand/uL (ref 3.8–10.8)

## 2023-11-20 LAB — RPR TITER: RPR Titer: 1:8 {titer} — ABNORMAL HIGH

## 2023-11-20 LAB — LIPID PANEL
Cholesterol: 158 mg/dL (ref <200)
HDL: 63 mg/dL (ref >=40)
LDL Cholesterol (Calc): 81 mg/dL
Non-HDL Cholesterol (Calc): 95 mg/dL (ref <130)
Total CHOL/HDL Ratio: 2.5 (calc) (ref <5.0)
Triglycerides: 55 mg/dL (ref <150)

## 2023-11-20 LAB — CYTOLOGY, (ORAL, ANAL, URETHRAL) ANCILLARY ONLY
Chlamydia: NEGATIVE
Chlamydia: NEGATIVE
Comment: NEGATIVE
Comment: NEGATIVE
Comment: NORMAL
Comment: NORMAL
Neisseria Gonorrhea: NEGATIVE
Neisseria Gonorrhea: NEGATIVE

## 2023-11-20 LAB — RPR: RPR Ser Ql: REACTIVE — AB

## 2023-11-20 LAB — HIV-1 RNA QUANT-NO REFLEX-BLD
HIV 1 RNA Quant: NOT DETECTED {copies}/mL
HIV-1 RNA Quant, Log: NOT DETECTED {Log_copies}/mL

## 2023-11-20 LAB — T PALLIDUM AB: T Pallidum Abs: POSITIVE — AB

## 2023-11-20 LAB — HEPATITIS C ANTIBODY: Hepatitis C Ab: NONREACTIVE

## 2023-12-15 ENCOUNTER — Ambulatory Visit (INDEPENDENT_AMBULATORY_CARE_PROVIDER_SITE_OTHER): Admitting: Internal Medicine

## 2023-12-15 ENCOUNTER — Other Ambulatory Visit: Payer: Self-pay | Admitting: Pharmacist

## 2023-12-15 ENCOUNTER — Other Ambulatory Visit: Payer: Self-pay

## 2023-12-15 ENCOUNTER — Other Ambulatory Visit (HOSPITAL_COMMUNITY)
Admission: RE | Admit: 2023-12-15 | Discharge: 2023-12-15 | Disposition: A | Source: Ambulatory Visit | Attending: Internal Medicine | Admitting: Internal Medicine

## 2023-12-15 ENCOUNTER — Telehealth: Payer: Self-pay

## 2023-12-15 ENCOUNTER — Other Ambulatory Visit (HOSPITAL_COMMUNITY): Payer: Self-pay

## 2023-12-15 ENCOUNTER — Encounter: Payer: Self-pay | Admitting: Internal Medicine

## 2023-12-15 VITALS — BP 129/90 | HR 94 | Temp 98.2°F | Resp 16 | Wt 135.6 lb

## 2023-12-15 DIAGNOSIS — Z79899 Other long term (current) drug therapy: Secondary | ICD-10-CM | POA: Diagnosis not present

## 2023-12-15 DIAGNOSIS — B20 Human immunodeficiency virus [HIV] disease: Secondary | ICD-10-CM | POA: Insufficient documentation

## 2023-12-15 DIAGNOSIS — Z1151 Encounter for screening for human papillomavirus (HPV): Secondary | ICD-10-CM | POA: Diagnosis not present

## 2023-12-15 LAB — COMPLETE METABOLIC PANEL WITHOUT GFR
AG Ratio: 1.6 (calc) (ref 1.0–2.5)
ALT: 13 U/L (ref 9–46)
AST: 19 U/L (ref 10–40)
Albumin: 4.8 g/dL (ref 3.6–5.1)
Alkaline phosphatase (APISO): 91 U/L (ref 36–130)
BUN/Creatinine Ratio: 13 (calc) (ref 6–22)
BUN: 17 mg/dL (ref 7–25)
CO2: 27 mmol/L (ref 20–32)
Calcium: 9.4 mg/dL (ref 8.6–10.3)
Chloride: 104 mmol/L (ref 98–110)
Creat: 1.34 mg/dL — ABNORMAL HIGH (ref 0.60–1.29)
Globulin: 3 g/dL (ref 1.9–3.7)
Glucose, Bld: 91 mg/dL (ref 65–99)
Potassium: 3.8 mmol/L (ref 3.5–5.3)
Sodium: 138 mmol/L (ref 135–146)
Total Bilirubin: 0.3 mg/dL (ref 0.2–1.2)
Total Protein: 7.8 g/dL (ref 6.1–8.1)

## 2023-12-15 MED ORDER — TRIUMEQ 600-50-300 MG PO TABS
1.0000 | ORAL_TABLET | Freq: Every day | ORAL | 11 refills | Status: AC
  Filled 2023-12-15: qty 30, 30d supply, fill #0
  Filled 2024-01-05 – 2024-01-07 (×2): qty 30, 30d supply, fill #1
  Filled 2024-02-03: qty 30, 30d supply, fill #2

## 2023-12-15 MED ORDER — DOXYCYCLINE HYCLATE 100 MG PO TABS: 100.0000 mg | ORAL_TABLET | Freq: Two times a day (BID) | ORAL | 0 refills | Status: AC

## 2023-12-15 NOTE — Progress Notes (Signed)
 Specialty Pharmacy Initiation Note   Daniel House is a 48 y.o. male who will be followed by the specialty pharmacy service for RxSp HIV    Review of administration, indication, effectiveness, safety, potential side effects, storage/disposable, and missed dose instructions occurred today for patient's specialty medication(s) Abacavir -Dolutegravir -Lamivud (Triumeq )     Patient/Caregiver did not have any additional questions or concerns.   Patient's therapy is appropriate to: Continue    Goals Addressed             This Visit's Progress    Achieve Undetectable HIV Viral Load < 20       Patient is on track. Patient will maintain adherence.      Improve or maintain quality of life       Patient is on track. Patient will be evaluated at upcoming provider appointment to assess progress.      Maintain optimal adherence to therapy       Patient is on track. Patient will maintain adherence.        Patient counseled today via office visit with Dr. Dennise. See note.  Daniel House L. Luismario Coston, PharmD, BCIDP, AAHIVP, CPP Infectious Diseases Clinical Pharmacist Practitioner Clinical Pharmacist Lead, Specialty Pharmacy [facility]

## 2023-12-15 NOTE — Progress Notes (Signed)
 Specialty Pharmacy Initial Fill Coordination Note  Daniel House is a 48 y.o. male contacted today regarding initial fill of specialty medication(s) Abacavir -Dolutegravir -Lamivud (Triumeq )   Patient requested [name] at [facility] at [place] date: 12/16/23   Medication will be filled on: 12/15/23    Patient is aware of $0 copayment.

## 2023-12-15 NOTE — Telephone Encounter (Signed)
 Patient called stating he was getting the run around on his meds and claim to be out and also afraid to miss a dose. Inquired if he would like to [facility] which he said yes. Moved his script to [place] and will enroll in [facility].

## 2023-12-15 NOTE — Progress Notes (Addendum)
 [facility]     HPI: Daniel House is a 48 y.o. male presents for HIV management. On triumeq , presents for cmp. Pt tolerating atorvastatin .    Past Medical History:  Diagnosis Date   Chronic kidney disease    Hematochezia 05/31/2011   HIV infection (HCC) 04/2011   PCP (pneumocystis jiroveci pneumonia) (HCC) 05/12/2011   Pneumonia 04/2011   PCP pneumonia    Seizures (HCC)     Past Surgical History:  Procedure Laterality Date   APPENDECTOMY  1996   ORIF WRIST FRACTURE Left 12/23/2014   Procedure: OPEN REDUCTION INTERNAL FIXATION (ORIF) WRIST FRACTURE;  Surgeon: Balinda Rogue, MD;  Location: [facility] OR;  Service: Plastics;  Laterality: Left;    Family History  Family history unknown: Yes   Current Outpatient Medications on File Prior to Visit  Medication Sig Dispense Refill   abacavir -dolutegravir -lamiVUDine  (TRIUMEQ ) 600-50-300 MG tablet TAKE 1 TABLET BY MOUTH DAILY 30 tablet 11   albuterol  (VENTOLIN  HFA) 108 (90 Base) MCG/ACT inhaler Inhale into the lungs every 6 (six) hours as needed for wheezing or shortness of breath. (Patient not taking: Reported on 11/18/2023)     Aspirin-Salicylamide-Caffeine (BC HEADACHE POWDER PO) Take by mouth.     atorvastatin  (LIPITOR) 10 MG tablet Take 1 tablet (10 mg total) by mouth daily. 30 tablet 5   No current facility-administered medications on file prior to visit.    Allergies  Allergen Reactions   Bactrim  [Sulfamethoxazole -Trimethoprim ] Other (See Comments)    Renal Failure   Truvada  [Emtricitabine -Tenofovir  Df] Other (See Comments)    Renal Failure      Lab Results HIV 1 RNA Quant  Date Value  11/18/2023 NOT DETECTED copies/mL  12/03/2022 <20 Copies/mL (H)  11/11/2021 <20 Copies/mL (H)   CD4 T Cell Abs (/uL)  Date Value  11/18/2023 586  12/03/2022 656  11/11/2021 588   No results found for: HIV1GENOSEQ Lab Results  Component Value Date   WBC 4.1 11/18/2023   HGB 14.2 11/18/2023   HCT 41.9  11/18/2023   MCV 101.2 (H) 11/18/2023   PLT 228 11/18/2023    Lab Results  Component Value Date   CREATININE 1.29 11/18/2023   BUN 13 11/18/2023   NA 138 11/18/2023   K 4.1 11/18/2023   CL 104 11/18/2023   CO2 28 11/18/2023   Lab Results  Component Value Date   ALT 17 11/18/2023   AST 20 11/18/2023   ALKPHOS 101 01/09/2020   BILITOT 0.6 11/18/2023    Lab Results  Component Value Date   CHOL 158 11/18/2023   TRIG 55 11/18/2023   HDL 63 11/18/2023   LDLCALC 81 11/18/2023   Lab Results  Component Value Date   HAV POS (A) 05/22/2011   Lab Results  Component Value Date   HEPBSAG NEGATIVE 05/02/2011   HEPBSAB POS (A) 05/22/2011   Lab Results  Component Value Date   HCVAB Reactive (A) 05/02/2011   Lab Results  Component Value Date   CHLAMYDIAWP Negative 11/18/2023   CHLAMYDIAWP Negative 11/18/2023   CHLAMYDIAWP Negative 11/18/2023   N Negative 11/18/2023   N Negative 11/18/2023   N Negative 11/18/2023   No results found for: GCPROBEAPT No results found for: QUANTGOLD  Assessment/Plan #HIV/Asymptomatic -Continue Triumeq  -cmp today, continue biktarvy  -F/U in 6 months   #RPR 1:8-> seem c/w serofast vs new exposure(less likely) -previously 1:2 on 11/24, 1:4 on 10/23: unclear if serofast -will treat out of abundace of cuation.  Pt states no new sexual exposures. 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, not true infection -doxy x 7 days    #Vaccination COVID Flu 10/19/23 Monkeypox PCV 23 in 2021 Meningitis utd HepA HEpB Tdap 2016 Shingles   #Health maintenance -Quantiferon -Lipid The 10-year ASCVD risk score (Arnett DK, et al., 2019) is: 10.2%   Values used to calculate the score:     Age: 29 years     Clincally relevant sex: Male     Is Non-Hispanic African American: Yes     Diabetic: No     Tobacco smoker: Yes     Systolic Blood Pressure: 157 mmHg     Is BP treated: No     HDL Cholesterol: 60 mg/dL     Total Cholesterol: 150 mg/dL  on  atorvastatin  -Dysplasia screen M-today     [name], MD [facility] I personally spent a total of 45 minutes in the care of the patient today including preparing to see the patient, getting/reviewing separately obtained history, performing a medically appropriate exam/evaluation, counseling and educating, placing orders, documenting clinical information in the EHR, independently interpreting results, and communicating results.

## 2023-12-15 NOTE — Patient Instructions (Signed)
 Labs in one week prior to next visit

## 2023-12-18 LAB — CYTOLOGY - PAP
Comment: NEGATIVE
Diagnosis: NEGATIVE
High risk HPV: NEGATIVE

## 2023-12-20 ENCOUNTER — Ambulatory Visit: Payer: Self-pay | Admitting: Internal Medicine

## 2024-01-05 ENCOUNTER — Other Ambulatory Visit: Payer: Self-pay

## 2024-01-05 ENCOUNTER — Other Ambulatory Visit (HOSPITAL_COMMUNITY): Payer: Self-pay

## 2024-01-07 ENCOUNTER — Other Ambulatory Visit (HOSPITAL_COMMUNITY): Payer: Self-pay

## 2024-01-07 ENCOUNTER — Other Ambulatory Visit: Payer: Self-pay

## 2024-01-07 NOTE — Progress Notes (Signed)
 Specialty Pharmacy Refill Coordination Note  Spoke with Daniel House is a 48 y.o. male contacted today regarding refills of specialty medication(s) Abacavir -Dolutegravir -Lamivud (Triumeq )  Doses on hand: 10   Patient requested: Pickup at [facility] at [place] date: 01/12/24  Medication will be filled on 01/11/24

## 2024-02-03 ENCOUNTER — Other Ambulatory Visit: Payer: Self-pay

## 2024-02-03 NOTE — Progress Notes (Signed)
 Specialty Pharmacy Refill Coordination Note  Daniel House is a 49 y.o. male contacted today regarding refills of specialty medication(s) Abacavir -Dolutegravir -Lamivud (Triumeq )   Patient requested Pickup at [facility] at [place] date: 02/05/24   Medication will be filled on: 02/04/24   Patient is aware of the $4.90 copay.

## 2024-02-04 ENCOUNTER — Other Ambulatory Visit: Payer: Self-pay
